# Patient Record
Sex: Female | Born: 1955
Health system: Southern US, Community
[De-identification: ages and names within clinical notes are randomized; demographics above are authoritative.]

## PROBLEM LIST (undated history)

## (undated) DIAGNOSIS — T7840XA Allergy, unspecified, initial encounter: Secondary | ICD-10-CM

## (undated) DIAGNOSIS — R3915 Urgency of urination: Secondary | ICD-10-CM

## (undated) DIAGNOSIS — N289 Disorder of kidney and ureter, unspecified: Secondary | ICD-10-CM

## (undated) DIAGNOSIS — E785 Hyperlipidemia, unspecified: Secondary | ICD-10-CM

## (undated) DIAGNOSIS — R350 Frequency of micturition: Secondary | ICD-10-CM

## (undated) DIAGNOSIS — R001 Bradycardia, unspecified: Secondary | ICD-10-CM

## (undated) DIAGNOSIS — A4902 Methicillin resistant Staphylococcus aureus infection, unspecified site: Secondary | ICD-10-CM

## (undated) DIAGNOSIS — I10 Essential (primary) hypertension: Secondary | ICD-10-CM

## (undated) DIAGNOSIS — H269 Unspecified cataract: Secondary | ICD-10-CM

## (undated) DIAGNOSIS — M1990 Unspecified osteoarthritis, unspecified site: Secondary | ICD-10-CM

## (undated) DIAGNOSIS — K219 Gastro-esophageal reflux disease without esophagitis: Secondary | ICD-10-CM

## (undated) HISTORY — PX: COLONOSCOPY: SHX174

## (undated) HISTORY — DX: Unspecified cataract: H26.9

## (undated) HISTORY — DX: Hyperlipidemia, unspecified: E78.5

## (undated) HISTORY — DX: Urgency of urination: R39.15

## (undated) HISTORY — DX: Methicillin resistant Staphylococcus aureus infection, unspecified site: A49.02

## (undated) HISTORY — PX: LITHOTRIPSY: SUR834

## (undated) HISTORY — DX: Allergy, unspecified, initial encounter: T78.40XA

## (undated) HISTORY — DX: Unspecified osteoarthritis, unspecified site: M19.90

---

## 1979-12-15 HISTORY — PX: ABDOMINAL HYSTERECTOMY: SHX81

## 1997-12-14 HISTORY — PX: OTHER SURGICAL HISTORY: SHX169

## 1999-02-03 ENCOUNTER — Inpatient Hospital Stay (HOSPITAL_COMMUNITY): Admission: EM | Admit: 1999-02-03 | Discharge: 1999-02-05 | Payer: Self-pay | Admitting: Cardiology

## 1999-02-03 ENCOUNTER — Emergency Department (HOSPITAL_COMMUNITY): Admission: EM | Admit: 1999-02-03 | Discharge: 1999-02-03 | Payer: Self-pay

## 1999-02-05 ENCOUNTER — Encounter: Payer: Self-pay | Admitting: Cardiology

## 1999-03-16 ENCOUNTER — Encounter: Payer: Self-pay | Admitting: *Deleted

## 1999-03-16 ENCOUNTER — Emergency Department (HOSPITAL_COMMUNITY): Admission: EM | Admit: 1999-03-16 | Discharge: 1999-03-16 | Payer: Self-pay | Admitting: *Deleted

## 1999-03-20 ENCOUNTER — Emergency Department (HOSPITAL_COMMUNITY): Admission: EM | Admit: 1999-03-20 | Discharge: 1999-03-20 | Payer: Self-pay | Admitting: Emergency Medicine

## 1999-04-29 ENCOUNTER — Ambulatory Visit (HOSPITAL_COMMUNITY): Admission: RE | Admit: 1999-04-29 | Discharge: 1999-04-29 | Payer: Self-pay | Admitting: *Deleted

## 1999-04-29 ENCOUNTER — Encounter: Payer: Self-pay | Admitting: *Deleted

## 1999-05-06 ENCOUNTER — Encounter: Payer: Self-pay | Admitting: Emergency Medicine

## 1999-05-06 ENCOUNTER — Emergency Department (HOSPITAL_COMMUNITY): Admission: EM | Admit: 1999-05-06 | Discharge: 1999-05-06 | Payer: Self-pay | Admitting: Emergency Medicine

## 1999-06-02 ENCOUNTER — Emergency Department (HOSPITAL_COMMUNITY): Admission: EM | Admit: 1999-06-02 | Discharge: 1999-06-02 | Payer: Self-pay | Admitting: Emergency Medicine

## 1999-11-08 ENCOUNTER — Emergency Department (HOSPITAL_COMMUNITY): Admission: EM | Admit: 1999-11-08 | Discharge: 1999-11-08 | Payer: Self-pay | Admitting: Emergency Medicine

## 1999-11-08 ENCOUNTER — Encounter: Payer: Self-pay | Admitting: Emergency Medicine

## 2000-01-03 ENCOUNTER — Encounter: Payer: Self-pay | Admitting: Emergency Medicine

## 2000-01-03 ENCOUNTER — Inpatient Hospital Stay (HOSPITAL_COMMUNITY): Admission: EM | Admit: 2000-01-03 | Discharge: 2000-01-08 | Payer: Self-pay | Admitting: Emergency Medicine

## 2000-01-04 ENCOUNTER — Encounter: Payer: Self-pay | Admitting: Internal Medicine

## 2000-01-05 ENCOUNTER — Encounter: Payer: Self-pay | Admitting: Internal Medicine

## 2000-02-18 ENCOUNTER — Emergency Department (HOSPITAL_COMMUNITY): Admission: EM | Admit: 2000-02-18 | Discharge: 2000-02-18 | Payer: Self-pay | Admitting: *Deleted

## 2000-02-18 ENCOUNTER — Encounter: Payer: Self-pay | Admitting: Emergency Medicine

## 2000-05-04 ENCOUNTER — Emergency Department (HOSPITAL_COMMUNITY): Admission: EM | Admit: 2000-05-04 | Discharge: 2000-05-05 | Payer: Self-pay | Admitting: *Deleted

## 2000-05-19 ENCOUNTER — Inpatient Hospital Stay (HOSPITAL_COMMUNITY): Admission: EM | Admit: 2000-05-19 | Discharge: 2000-05-21 | Payer: Self-pay | Admitting: Emergency Medicine

## 2000-05-19 ENCOUNTER — Encounter: Payer: Self-pay | Admitting: Emergency Medicine

## 2000-05-26 ENCOUNTER — Ambulatory Visit: Admission: RE | Admit: 2000-05-26 | Discharge: 2000-05-26 | Payer: Self-pay | Admitting: *Deleted

## 2000-06-08 ENCOUNTER — Encounter (INDEPENDENT_AMBULATORY_CARE_PROVIDER_SITE_OTHER): Payer: Self-pay | Admitting: Specialist

## 2000-06-08 ENCOUNTER — Ambulatory Visit (HOSPITAL_COMMUNITY): Admission: RE | Admit: 2000-06-08 | Discharge: 2000-06-08 | Payer: Self-pay | Admitting: Critical Care Medicine

## 2000-06-08 ENCOUNTER — Encounter: Payer: Self-pay | Admitting: Critical Care Medicine

## 2000-07-04 ENCOUNTER — Emergency Department (HOSPITAL_COMMUNITY): Admission: EM | Admit: 2000-07-04 | Discharge: 2000-07-04 | Payer: Self-pay | Admitting: Emergency Medicine

## 2000-07-04 ENCOUNTER — Encounter: Payer: Self-pay | Admitting: Emergency Medicine

## 2000-08-03 ENCOUNTER — Other Ambulatory Visit: Admission: RE | Admit: 2000-08-03 | Discharge: 2000-08-03 | Payer: Self-pay | Admitting: Family Medicine

## 2000-08-18 ENCOUNTER — Encounter: Payer: Self-pay | Admitting: Family Medicine

## 2000-08-18 ENCOUNTER — Ambulatory Visit (HOSPITAL_COMMUNITY): Admission: RE | Admit: 2000-08-18 | Discharge: 2000-08-18 | Payer: Self-pay | Admitting: Family Medicine

## 2000-08-18 ENCOUNTER — Ambulatory Visit: Admission: RE | Admit: 2000-08-18 | Discharge: 2000-08-18 | Payer: Self-pay | Admitting: Family Medicine

## 2000-09-11 ENCOUNTER — Encounter: Payer: Self-pay | Admitting: Emergency Medicine

## 2000-09-11 ENCOUNTER — Emergency Department (HOSPITAL_COMMUNITY): Admission: EM | Admit: 2000-09-11 | Discharge: 2000-09-11 | Payer: Self-pay

## 2001-07-02 ENCOUNTER — Emergency Department (HOSPITAL_COMMUNITY): Admission: EM | Admit: 2001-07-02 | Discharge: 2001-07-03 | Payer: Self-pay | Admitting: Emergency Medicine

## 2001-07-03 ENCOUNTER — Encounter: Payer: Self-pay | Admitting: Emergency Medicine

## 2002-02-01 ENCOUNTER — Encounter: Payer: Self-pay | Admitting: Cardiology

## 2002-02-01 ENCOUNTER — Inpatient Hospital Stay (HOSPITAL_COMMUNITY): Admission: EM | Admit: 2002-02-01 | Discharge: 2002-02-02 | Payer: Self-pay | Admitting: *Deleted

## 2002-02-01 ENCOUNTER — Encounter: Payer: Self-pay | Admitting: *Deleted

## 2002-02-02 ENCOUNTER — Encounter: Payer: Self-pay | Admitting: Cardiology

## 2002-07-06 ENCOUNTER — Emergency Department (HOSPITAL_COMMUNITY): Admission: EM | Admit: 2002-07-06 | Discharge: 2002-07-06 | Payer: Self-pay | Admitting: Emergency Medicine

## 2003-11-30 ENCOUNTER — Emergency Department (HOSPITAL_COMMUNITY): Admission: EM | Admit: 2003-11-30 | Discharge: 2003-12-01 | Payer: Self-pay | Admitting: Emergency Medicine

## 2004-07-01 ENCOUNTER — Encounter: Admission: RE | Admit: 2004-07-01 | Discharge: 2004-07-01 | Payer: Self-pay | Admitting: Internal Medicine

## 2005-05-18 ENCOUNTER — Emergency Department (HOSPITAL_COMMUNITY): Admission: EM | Admit: 2005-05-18 | Discharge: 2005-05-18 | Payer: Self-pay | Admitting: Emergency Medicine

## 2006-06-25 ENCOUNTER — Emergency Department (HOSPITAL_COMMUNITY): Admission: EM | Admit: 2006-06-25 | Discharge: 2006-06-25 | Payer: Self-pay | Admitting: Emergency Medicine

## 2006-06-28 ENCOUNTER — Ambulatory Visit (HOSPITAL_COMMUNITY): Admission: RE | Admit: 2006-06-28 | Discharge: 2006-06-28 | Payer: Self-pay | Admitting: Urology

## 2009-01-28 ENCOUNTER — Emergency Department (HOSPITAL_COMMUNITY): Admission: EM | Admit: 2009-01-28 | Discharge: 2009-01-28 | Payer: Self-pay | Admitting: Emergency Medicine

## 2011-02-05 ENCOUNTER — Other Ambulatory Visit: Payer: Self-pay | Admitting: Family Medicine

## 2011-02-05 DIAGNOSIS — R609 Edema, unspecified: Secondary | ICD-10-CM

## 2011-02-05 DIAGNOSIS — R52 Pain, unspecified: Secondary | ICD-10-CM

## 2011-02-10 ENCOUNTER — Other Ambulatory Visit: Payer: Self-pay

## 2011-02-12 ENCOUNTER — Other Ambulatory Visit: Payer: Self-pay

## 2011-02-12 ENCOUNTER — Ambulatory Visit
Admission: RE | Admit: 2011-02-12 | Discharge: 2011-02-12 | Disposition: A | Discharge status: Home / Self Care | Payer: BC Managed Care – PPO | Source: Ambulatory Visit | Attending: Family Medicine | Admitting: Family Medicine

## 2011-02-12 DIAGNOSIS — R52 Pain, unspecified: Secondary | ICD-10-CM

## 2011-02-12 DIAGNOSIS — R609 Edema, unspecified: Secondary | ICD-10-CM

## 2011-02-20 ENCOUNTER — Emergency Department (HOSPITAL_COMMUNITY)
Admission: EM | Admit: 2011-02-20 | Discharge: 2011-02-20 | Disposition: A | Discharge status: Home / Self Care | Payer: BC Managed Care – PPO | Attending: Emergency Medicine | Admitting: Emergency Medicine

## 2011-02-20 DIAGNOSIS — Z86718 Personal history of other venous thrombosis and embolism: Secondary | ICD-10-CM | POA: Insufficient documentation

## 2011-02-20 DIAGNOSIS — I10 Essential (primary) hypertension: Secondary | ICD-10-CM | POA: Insufficient documentation

## 2011-02-20 DIAGNOSIS — Z87442 Personal history of urinary calculi: Secondary | ICD-10-CM | POA: Insufficient documentation

## 2011-02-20 DIAGNOSIS — M25569 Pain in unspecified knee: Secondary | ICD-10-CM | POA: Insufficient documentation

## 2011-02-20 DIAGNOSIS — M7120 Synovial cyst of popliteal space [Baker], unspecified knee: Secondary | ICD-10-CM | POA: Insufficient documentation

## 2011-02-20 DIAGNOSIS — M545 Low back pain, unspecified: Secondary | ICD-10-CM | POA: Insufficient documentation

## 2011-02-20 LAB — URINALYSIS, ROUTINE W REFLEX MICROSCOPIC
Bilirubin Urine: NEGATIVE
Glucose, UA: NEGATIVE mg/dL
Hgb urine dipstick: NEGATIVE
Ketones, ur: NEGATIVE mg/dL
Nitrite: NEGATIVE
Protein, ur: NEGATIVE mg/dL
Specific Gravity, Urine: 1.021 (ref 1.005–1.030)
Urobilinogen, UA: 1 mg/dL (ref 0.0–1.0)
pH: 6 (ref 5.0–8.0)

## 2011-02-20 LAB — POCT I-STAT, CHEM 8
BUN: 27 mg/dL — ABNORMAL HIGH (ref 6–23)
Calcium, Ion: 1.24 mmol/L (ref 1.12–1.32)
Chloride: 106 mEq/L (ref 96–112)
Creatinine, Ser: 1.2 mg/dL (ref 0.4–1.2)
Glucose, Bld: 91 mg/dL (ref 70–99)
HCT: 40 % (ref 36.0–46.0)
Hemoglobin: 13.6 g/dL (ref 12.0–15.0)
Potassium: 3.6 mEq/L (ref 3.5–5.1)
Sodium: 144 mEq/L (ref 135–145)
TCO2: 27 mmol/L (ref 0–100)

## 2011-03-31 LAB — DIFFERENTIAL
Basophils Absolute: 0.1 10*3/uL (ref 0.0–0.1)
Basophils Relative: 1 % (ref 0–1)
Eosinophils Absolute: 0.2 10*3/uL (ref 0.0–0.7)
Eosinophils Relative: 2 % (ref 0–5)
Lymphocytes Relative: 38 % (ref 12–46)
Lymphs Abs: 3.6 10*3/uL (ref 0.7–4.0)
Monocytes Absolute: 0.6 10*3/uL (ref 0.1–1.0)
Monocytes Relative: 7 % (ref 3–12)
Neutro Abs: 5.1 10*3/uL (ref 1.7–7.7)
Neutrophils Relative %: 54 % (ref 43–77)

## 2011-03-31 LAB — CBC
HCT: 37.9 % (ref 36.0–46.0)
Hemoglobin: 12.9 g/dL (ref 12.0–15.0)
MCHC: 33.9 g/dL (ref 30.0–36.0)
MCV: 87.6 fL (ref 78.0–100.0)
Platelets: 351 10*3/uL (ref 150–400)
RBC: 4.32 MIL/uL (ref 3.87–5.11)
RDW: 14.4 % (ref 11.5–15.5)
WBC: 9.5 10*3/uL (ref 4.0–10.5)

## 2011-03-31 LAB — POCT CARDIAC MARKERS
CKMB, poc: <1 ng/mL — ABNORMAL LOW (ref 1.0–8.0)
Myoglobin, poc: 44.4 ng/mL (ref 12–200)
Troponin i, poc: <0.05 ng/mL (ref 0.00–0.09)

## 2011-03-31 LAB — POCT I-STAT, CHEM 8
BUN: 30 mg/dL — ABNORMAL HIGH (ref 6–23)
Calcium, Ion: 1.23 mmol/L (ref 1.12–1.32)
Chloride: 111 mEq/L (ref 96–112)
Creatinine, Ser: 1.4 mg/dL — ABNORMAL HIGH (ref 0.4–1.2)
Glucose, Bld: 88 mg/dL (ref 70–99)
HCT: 41 % (ref 36.0–46.0)
Hemoglobin: 13.9 g/dL (ref 12.0–15.0)
Potassium: 4 mEq/L (ref 3.5–5.1)
Sodium: 142 mEq/L (ref 135–145)
TCO2: 24 mmol/L (ref 0–100)

## 2011-05-01 NOTE — Procedures (Signed)
[place]. [facility]  Patient:    Amber Meyer, Amber Meyer                   MRN: [id number] Proc. Date: 06/08/00 Adm. Date:  [id number] Disc. Date: [id number] Attending:  Anastasio Auerbach CC:         Crista Luria, M.D.                           Procedure Report  PROCEDURE PERFORMED:  Bronchoscopy.  ENDOSCOPIST:  Charlcie Cradle. Delford Field, M.D. [facility]  INDICATIONS FOR PROCEDURE:  Bilateral pulmonary infiltrates, evaluate for sarcoidosis.  ANESTHESIA:  1% Xylocaine local.  PREOPERATIVE MEDICATIONS:  Demerol 50 mg IV push.  Versed 5 mg IV push.  DESCRIPTION OF PROCEDURE:  The Olympus video bronchoscope was introduced to the right naris.  The upper airways were visualized and were unremarkable. The entire tracheobronchial tree was visualized, revealed cobblestone appearance to the airway but no other specific endobronchial lesions were seen.  Transbronchial biopsies x 6 were obtained from the lateral segment, right lower lobe.  Bronchial washings were obtained as well from the right lower lobe.  COMPLICATIONS:  None.  IMPRESSION:  Interstitial granulomatous changes in the lower lobes evaluate for granulomatous disease.  RECOMMENDATIONS:  Follow up pathology. DD:  06/08/00 TD:  06/09/00 Job: [id number]

## 2011-05-01 NOTE — Discharge Summary (Signed)
[place]. [facility]  Patient:    Amber Meyer, Amber Meyer                   MRN: [id number] Adm. Date:  [id number] Disc. Date: [id number] Attending:  Anastasio Auerbach CC:         Crista Luria, M.D.             Charlcie Cradle Delford Field, M.D. [facility]             Mohan N. Sharyn Lull, M.D.             Lindaann Slough, M.D.                           Discharge Summary  DATE OF BIRTH:  12-26-1955  DISCHARGE DIAGNOSES:  1. Chest pain/shortness of breath.     a. No evidence of pulmonary embolus by CT scan.     b. No evidence of deep venous thrombosis by Dopplers.     c. No evidence of myocardial ischemia.     d. Probable gastroesophageal reflux disease.  2. Benign positional vertigo.  MRI, MRA of brain normal.  3. Chronic heartburn.  4. Uncontrolled hypertension, improved.  5. Mediastinal adenopathy.     a. CT scan, question sarcoidosis.     b. ACE level 40, January, 2001.     c. Follow up with pulmonologist.  6. Hypokalemia.     a. Secondary to vomiting prior to admission.     b. Resolved.  7. Vomiting secondary to vertigo, resolved.  Guaiac-positive emesis,     hemoglobin stable.  8. History of bilateral pulmonary emboli, January, 2001.  Spiral CT (January,     2001):  Segmental defect in the left lower lobe and questionable right     lower lobe.  9. Kidney stones.     a. History of surgery and lithotripsy.     b. CT scan (March, 2001):  Nonobstructing left renal stone. 10. Vaginal hysterectomy, 1983.     a. For menorrhagia.     b. Questionable right ovarian cyst by CT scan (March, 2001).     c. Soft tissue prominence along the medial aspect of both ovaries        consistent with possible post-surgical changes - March 1 CT (no change        from November, 2000 CT). 11. Back lipoma, removed 1989. 12. Multiple tiny (1-2 mm) intrahepatic cysts by CT scan (March, 2001). 13. Questionable history of low sugars as an outpatient.  Fasting CBGs in     hospital 71 to  92.  DISCHARGE MEDICATIONS:  1. Protonix 40 mg p.o. b.i.d. x 2 weeks, then daily for stomach irritation     (new medication).  2. Lopressor 50 mg one p.o. q.12h. (new medication, replacing Toprol).  3. Meclizine 25 mg q.6h. x 4 days then p.r.n. vertigo.  4. Maxzide 25/37.5 daily.  5. Enteric-coated aspirin 325 mg q.d. (new, cardioprotection).  DO NOT TAKE TOPROL, PEPCID, OR COUMADIN.  CONDITION UPON DISCHARGE:  Stable.  Mrs. Ector still has some anterior chest discomfort but an absolutely normal EKG.  No shortness of breath.  Her blood pressure upon discharge is 120/60, heart rate 70, respiratory rate 20, oxygen saturation 95% on room air.  DISPOSITION:  The patient will be discharged home.  RECOMMENDED ACTIVITY:  No driving until vertigo resolves.  I have excused the patient from work through June 14 due to upcoming  physician appointments.  RECOMMENDED DIET:  Low salt, low fat, frequent meals.  SPECIAL INSTRUCTIONS:  1. Avoid Motrin-like medications which can irritate the stomach.  2. Use Tylenol as needed for pain.  3. Contact Dr. Crista Luria should problems arise before follow-up     appointment.  FOLLOW-UP:  1. Dr. Crista Luria at [facility] on Tuesday, June 12, at 8:30 a.m.  At     this visit, she will need her blood pressure rechecked on the new     medications as well as a basic metabolic panel checked.  She had a low     potassium on admission.  2. Pulmonary function tests at [facility] on Wednesday, June 13, at     10 oclock.  The patient was instructed to come 15 minutes early and check     in at the outpatient registration.  3. Dr. Shan Levans [facility]mon[facility]) on Thursday, June 14, at 11:30.     The patient is to come 15 minutes early and was instructed to bring her     most recent chest x-ray and CT scan from [facility]  LABORATORIES PENDING UPON DISCHARGE:  Factor V Leiden and prothrombin gene mutation.  CONSULTANTS:   None.  PROCEDURES:  1. Electrocardiogram:  Normal sinus rhythm.  Mild left axis deviation on     initial electrocardiogram.  2. Head CT without contrast (June 6) negative.  3. Chest x-ray (May 19, 2000):  Improved aeration from January, 1997.  Heart     size and mediastinal contours are within normal limits.  Lungs clear.  4. Chest CT with contrast (June 6):  Changes of subcarinal and mediastinal     lymphadenopathy.  Question of sarcoidosis versus hyperplastic lymph nodes.     Mild ground glass lung density also suggested with a few areas of     subpleural blood formation.  No evidence for pulmonary embolus.  The     adenopathy was commented as stable.  5. CT of the lower extremities (June 6):  No evidence for deep venous     thrombosis.  6. MRI/MRA of the brain:  Normal.  7. Lower extremity venous Dopplers (June 7):  Right normal.  Left shows no     evidence of acute thrombosis.  She does have a history of deep venous     thrombosis from the superficial femoral vein to the distal iliac.  The     vessel walls appeared thickened indicating this history of     thrombophlebitis.  HOSPITAL COURSE: #1 - CHEST PAIN/SHORTNESS OF BREATH:  Mrs. Etheridge is a 55 year old African- American female with a history of PE January, 2001 and a recent low INR who awakened this morning with new onset of sharp, pleuritic substernal chest pain associated with shortness of breath.  She subsequently developed vertigo with nausea and vomiting.  Prior to this, she denied any cough or a history of dyspnea on exertion.  With her previous pulmonary embolus in January, she simply presented with dyspnea on exertion.  There was no chest pain.  She did have a DVT at that time but no leg swelling.  ABG on room air demonstrated a pH of 7.40 with a PCO2 of 42 and a PO2 of 81.  Her AA gradient was 10. Subsequent spiral chest CT to rule out pulmonary embolus was negative as well  as CT through the upper legs was  negative for DVT.  Lower extremity venous Dopplers were also negative.  Her initial EKG demonstrated normal sinus rhythm with left axis deviation.  Serial cardiac enzymes were negative.  Follow-up EKG was completely normal.  Of note, she did have a negative stress Cardiolite done by Dr. Sharyn Lull in February, 2000.  She suffers from chronic daily indigestion and this could have likely represented some reflex with esophageal spasm.  I placed her on a proton pump inhibitor.  She will follow up with Dr. Marny Lowenstein to reassess symptoms.  Referral to Dr. Sharyn Lull is at Dr. Lona Millard discretion.  #2 - VERTIGO:  Mrs. Kreiger was seen three weeks ago in the outpatient clinic with new onset vertigo.  She was diagnosed as having an inner ear problem and given meclizine.  She denies any other cranial nerve symptoms with the vertigo.  Given the persistent nature of her symptoms and the presentation with chest pain, I did opt to do an MRI/MRA which was completely normal. Indeed, I think this is benign positional vertigo and will continue her on meclizine for the next four days and then as needed.  #3 - UNCONTROLLED HYPERTENSION:  Mrs. Cillo had had clinic blood pressures in the 160 to 170 range on Toprol and a diuretic.  I did opt to switch her to Lopressor twice a day.  On 100 mg b.i.d., her morning pressure was 110.  I opted to cut her back to 50 mg b.i.d. and she will follow up with Dr. Maple Hudson next week for repeat blood pressure.  #4 - HYPOKALEMIA:  On presentation, Mrs. Pistilli had a potassium level of 3.5. She is on Maxzide and had been vomiting.  I suspect this to be the reason. She did receive oral potassium and her discharge level was 4.2.  Recheck in follow-up.  I doubt that she will need maintenance potassium.  #5 - HISTORY OF BILATERAL PULMONARY EMBOLI:  She was diagnosed with these in January, 2001.  She has been on Coumadin now for nearly six months.  She then had a subtherapeutic INR on  presentation and, apparently, a week earlier, it was also low at 1.8.  Come to find out, Mrs. Criss has been eating large amounts of strawberries daily which I suspect has inhibited Coumadin based on vitamin K load.  Given the fact that there was no predisposing factor and no clear reason to indicate she would be hypercoagulable, I think it is reasonable to stop the Coumadin at this time.  I did opt to draw a factor V Leiden gene mutation as well as a prothrombin gene mutation.  She did have an ANA done in January at the time of diagnoses which was negative.  We will watch closely for any signs of clotting.  I discussed this with Mrs. Dudzinski and she understands.  Should anything else arise suggesting she was hypercoagulable, would defer resumption of Coumadin to Drs. Menzer and [employer].  #6 - MEDIASTINAL ADENOPATHY:  In going back through Mrs. Bergquist records, she had evidence of mediastinal adenopathy as well as ground glass appearance on her January, 2001 CT scan.  An ACE level was drawn at that time which was normal at 40.  Repeat CT scan done this admission, looking for pulmonary embolus, did again demonstrate the adenopathy and ground glass appearance. The adenopathy appeared stable based on scan.  Mrs. Dinning denies significant dyspnea on exertion or chronic exertion and has no chronic cough.  She does not seem to demonstrate any other signs of sarcoidosis.  Certainly within the differential also remains a lymphoma.  Mrs. Musgrave  quit smoking 12 years ago, so small cell lung cancer is less likely.  However, I think it is important for [place] to figure out exactly what this is.  She will have pulmonary function tests done on June 13 with results to go to Dr. Delford Field.  She will see Dr. Delford Field on May 14 for her first visit with him.  She will be bringing her recent chest x-ray and CAT scan for review and I will forward him a copy of this discharge summary.  Of note, she has not been on  any seizure medications.  #7 - ABNORMALITIES ON ABDOMINOPELVIC CT (February 18, 2000):  Mrs. Ortlieb appeared to have a right ovarian cyst.  There was also some soft tissue prominence along the medial aspect of both ovaries which was felt to be consistent with post surgical changes.  They commented that it looked the same as on the CT scan done November 08, 1999.  I discussed this with Dr. Marny Lowenstein. If there is any suggestion of abnormal pelvic examination or problems, would consider ultrasound.  Again, this CT scan was done not this admission but in March, 2001.  DISCHARGE LABORATORIES:  Sodium 137, potassium 4.2, chloride 105, bicarbonate 29, BUN 11, creatinine 0.9, glucose 12.7, WBC 7200, platelet count 251.  INR 1.1. DD:  05/21/00 TD:  05/25/00 Job: [id number]

## 2011-05-01 NOTE — Discharge Summary (Signed)
[place]. [facility]  Patient:    Amber Meyer                    MRN: [id number] Adm. Date:  [id number] Disc. Date: [id number] Attending:  Rinaldo Cloud N                           Discharge Summary  ADMITTING DIAGNOSES: 1. Bilateral pulmonary embolism. 2. Uncontrolled hypertension. 3. Tobacco abuse.  DISCHARGE DIAGNOSES: 1. Bilateral pulmonary embolism. 2. Hypertension. 3. Tobacco abuse.  DISCHARGE MEDICATIONS: 1. Coumadin 4 mg 1 tablet q.d. 2. Baby aspirin 81 mg 1 tablet q.d. 3. Toprol XL 50 mg q.d. 4. Prilosec 20 mg 1 capsule q.d. at night.  ACTIVITY: As tolerated.  DIET: Low salt, low cholesterol.  The patient has been advised that if she notices any bleeding or feeling weak or tired, dizzy or has black tarry stools, she should call immediately.  The patient has been advised to have a CBC and pro time in one week.  She will follow up with me in one week.  CONDITION ON DISCHARGE: Stable.  HISTORY OF PRESENT ILLNESS: [name] is a 55 year old black female with a past medical history significant for hypertension and tobacco abuse.  She was admitted by Dr. August Saucer on January 20 because of the sudden onset of sharp chest pain associated with shortness of breath.  There was no substernal chest pain or diaphoresis.  The patient attempted to rest during the night but noted increasing shortness of breath which was worse when she reclined.  She also had transient pleuritic chest pain as well on the left side of the chest.  The patient denied any fever or chills.  Due to the persistence of the symptoms the patient decided to  come to the ER.  In the ER the patient subsequently had a CT of chest which showed bilateral pulmonary embolisms.  She was started on Lovenox and Coumadin.  PAST MEDICAL HISTORY:  History of recurrent chest pain.  She had Persantine and  Cardiolite in the past which was negative.  The other significant past  medical history is hypertension and GERD.  Home medications were Toprol 50 mg p.o. q.d. and Prilosec 20 mg p.o. h.s.  The patient denies any history of prolonged ______ , history of trauma.  She denies any leg cramps or history of oral contraceptives.  There was no significant change in weight.  SOCIAL HISTORY: She does not smoke or drink.  She works in [employer].  She is on her feet most of the day. The patient is single and has four children, alive and well.  ALLERGIES: No known drug allergies.  PAST MEDICAL HISTORY:  She had a history of a hysterectomy about 18 years ago. She had a tumor removed from her back 9 years ago.  There is a history of kidney stones in the past.  FAMILY HISTORY:  Family history is positive for coronary artery disease.  PHYSICAL EXAMINATION:  GENERAL: On examination she was alert, oriented x 3 and in no acute distress.  VITAL SIGNS: The blood pressure was 175/120.  Pulse was 122, respirations were 0. O2 saturations on room air were 98%.  EYES:  The conjunctiva was pink.  NECK: The neck was supple.  There was no JVD and no bruit.  LUNGS:  The lungs were clear to auscultation.  HEART:  There was no pericardial rub.  There was no  pleuritic rub.  S1 and S2 were normal.  There was no S3 gallop and no murmurs or rubs.  There was no ______ as to palpation.  ABDOMEN:  The abdomen was soft.  Bowel sounds were present.  It was nontender with no masses.  EXTREMITIES:  There was no clubbing, cyanosis or edema.  There was no evidence f deep venous thrombosis.  LABORATORY DATA:  CT of the chest showed segmental defects in the left lower lobe and question of the right lower lobe consistent with pulmonary embolism.  Hemoccult was negative.  Sodium was 144, potassium was 3.5, chloride 108, bicarb 26.  Glucose was 105, BUN 13, creatinine 0.9.  Her ANA was negative.  Cardiac enzymes were negative.  EKG showed sinus tachycardia.  Otherwise,  there was no evidence of acute right heart strain present.  The patient was admitted to the telemetry floor and was started on subcutaneous  Lovenox 1 mg/kg q. 12h. and was also started on Coumadin.  The patient gradually improved.  Her chest pain resolved.  Her prothrombin time today is 20.3 with INR of 2.4.  The patient has no evidence of bleeding.  The patients hemoglobin has been stable.  The patient is chest pain free.  There is no evidence of bleeding. The patient did not have any further episodes of chest pain during the hospital stay.  The patient will be discharged home on the above medications and will be followed up in my office in one week. DD:  01/08/00 TD:  01/08/00 Job: [id number]

## 2011-05-01 NOTE — H&P (Signed)
[place]. [facility]  Patient:    Amber Meyer                    MRN: [id number] Adm. Date:  [id number] Attending:  Gwenyth Bender                         History and Physical  PATIENT LOCATION:  ICU.  CHIEF COMPLAINT:  Chest pain with shortness of breath.  HISTORY OF PRESENT ILLNESS:  This is the second recent [facility] admission for this 55 year old black female who was doing well until yesterday afternoon at approximately 3 p.m.  While getting out of her car the patient developed a sudden sharp chest pain with shortness of breath.  There was no substernal pressure pain or diaphoresis.  The patient attempted to rest during he night, but noted increasing shortness of breath which was worse when she reclined. She did have transient pleuritic pain as well on the left chest region as well s the back.  During the night she experienced transient left arm pain.  With the symptoms persisting the patient subsequently came to the emergency room at approximately 4 p.m. for further evaluation.  The patient was found to have a pulmonary embolus by CT scan and is admitted at this time.  PAST MEDICAL HISTORY:  Remarkable for having had similar complaints of chest pain in March 2000.  Cardiac evaluation including Persantine Cardiolite stress test as negative.  She was noted to have hypertension with the possibility of gastrointestinal reflux.  The patient was treated and evaluated by Dr. _______ t that time and discharged to home on Toprol 50 mg and Prilosec.  The patient denies recent prolonged bed rest.  No history of trauma.  She denies any leg cramps.  No oral contraceptives.  No significant change in weight.  She has noted intermittent swelling in her ankles for the past two weeks.  SOCIAL HISTORY:  The patient does not smoke or drink.  She works in [employer], and she is on her feet most of the days.  The patient is single, has four  children alive and well.  REVIEW OF SYSTEMS:  Otherwise remarkable for intermittent pain in the right hand with a mild swelling noted four days ago.  No known trauma.  She has not noted ny other bruises.  CURRENT MEDICATIONS:  As noted above, Toprol XL 50 mg q.d.  ALLERGIES:  The patient has no known allergies.  PAST SURGICAL HISTORY:  She has had a hysterectomy approximately 18 years ago. She has had a tumor removed from her back nine years ago.  The patient reports having had two kidney stones removed on the right side over the past 10 years.  FAMILY HISTORY:  The patient has a strong family history of myocardial infarction occurring at an early age.  PHYSICAL EXAMINATION:  VITAL SIGNS:  Initial blood pressure 175/120, pulse 122, respirations 20, temperature 99.1.  O2 saturation was 98% on room air.  GENERAL APPEARANCE:  She is a well-developed, well-nourished black female in mild distress.  HEENT:  Head:  Normocephalic, atraumatic without bruits.  Extraocular muscles are intact.  Sclerae nonicteric.  She has bilateral maxillary sinus tenderness. Posterior pharynx is clear.  TMs are clear.  NECK:  Supple without enlarged thyroid.  No posterior cervical nodes.  CHEST:  Lungs notably are clear without wheezes or rales.  No rhonchi or rub. Patchy E to A change heard  at the left base.  CARDIOVASCULAR:  Normal S1 and S2.  No S3, S4, murmurs, or rubs.  No chest wall  tenderness to palpation.  ABDOMEN:  Bowel sounds are present.  No enlarged liver or spleen, masses, or tenderness.  MUSCULOSKELETAL:  Right lower extremity is benign.  Left lower leg notable for tenderness in the superficial vein medially.  She has a positive Homans sign in he left leg as well.  No edema appreciated.  Pulses are intact.  Notably on the right hand she has an area between her thumb and index finger where there is a raised  area of induration which is tender to palpation without increased  warmth.  This is not definitely in a vein.  No other unusual lesions noted.  LABORATORY AND X-RAY DATA:  Preliminary labs:  PT of 13.7, INR of 1.1, PTT of 33. CK 131, CK-MB 0.5, troponin 0.03.  CT scan shows segmental defects in the left lower lung and questionable right lower lung consistent with a pulmonary embolus.  IMPRESSION: 1. Acute pulmonary embolus, symptomatic. 2. Hypertension, uncontrolled. 3. Chest pain secondary to the above. 4. Rule out deep venous thrombosis of the left lower extremity. 5. Rule out underlying vasculitis.  PLAN:  The patient is to be admitted to the ICU for 24 hours and then to telemetry if stable.  Will start Lovenox followed by Coumadin.  Will obtain venous Dopplers of the lower extremities.  Will check an ANA as well.  Further control of blood  pressure as necessary.  Continue O2 support for now. DD:  01/03/00 TD:  01/04/00 Job: [id number]

## 2011-05-01 NOTE — Discharge Summary (Signed)
[place]  Patient:    Amber Meyer, Amber Meyer Visit Number: [id number] MRN: [id number]          Service Type: MED Location: [phone] Attending Physician:  Robynn Pane Admit Date:  02/01/2002 Discharge Date: 02/02/2002                             Discharge Summary  ADMITTING DIAGNOSES: 1. Chest pain, rule out myocardial infarction, rule out pulmonary embolism. 2. Hypertension. 3. History of tobacco abuse. 4. Positive family history of coronary artery disease. 5. Surgical menopause. 6. 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 7. History of kidney stones. 8. Questionable history of sarcoidosis.  FINAL DIAGNOSES: 1. Chest pain, rule out gastroesophageal reflux disease, rule out cervical    radiculopathy. 2. Hypertension. 3. History of tobacco abuse. 4. Positive family history of coronary artery disease. 5. Surgical menopause. 6. 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 7. History of kidney stones. 8. Questionable history of sarcoidosis.  DISCHARGE MEDICATIONS: 1. Toprol XL 200 mg 1 tablet daily. 2. Altace 2.5 mg 1 capsule daily. 3. Enteric coated aspirin 325 mg 1 tablet daily. 4. Protonix 40 mg 1 tablet daily a half hour before breakfast.  ACTIVITY:  As tolerated.  DIET:  Low salt, low cholesterol.  FOLLOWUP:  Follow up with myself in one week and primary medical doctor as scheduled.  CONDITION ON DISCHARGE:  Stable.  BRIEF HISTORY/HOSPITAL COURSE:  Amber Meyer is a 55 year old black female with past medical history significant for hypertension, history of bilateral PE and DVT approximately two years ago, was treated with Coumadin for approximately six months, questionable history of sarcoidosis, positive family history of coronary artery disease.  She came to the ER complaining of retrosternal  chest pressure described as if someone was sitting on the chest, grade 8 over 10, associated with numbness in both arms, nausea.  She denies any shortness of breath, diaphoresis, palpitations, light-headedness, or syncope.  She denies relation of chest pain to breathing, food, or movement. She denies PND, orthopnea, leg swelling.  She denies cough, fever, chills, no history of exertional chest pain, but complains of exertional dyspnea with minimal exertion for approximately the last three months.  She states she took Coumadin for six months after PE as above and had stopped for more than one year.  PAST MEDICAL HISTORY:  As above.  PAST SURGICAL HISTORY:  She had a vaginal hysterectomy in 1983.  She had lithotripsy in 1994 and 1995 for kidney stones.  She has back surgery for lipoma in 1989.  ALLERGIES:  No known drug allergies.  MEDICATIONS:  She takes Toprol XL 200 mg p.o. q.d. at home.  SOCIAL HISTORY:  She is single, has four children.  She smoked a half a pack per day for 8-10 years.  She quit seven years ago.  No history of alcohol abuse.  She was born in [place].  She lives in [place].  She worked as a Electrical engineer.  FAMILY HISTORY:  Father is alive and is 42 years of age.  He has Alzheimers disease.  Mother died of ruptured aneurysm of the brain at the age of 90.  One brother died of MI at the age of 37.  One sister had an MI at the age of 41.  PHYSICAL EXAMINATION:  GENERAL:  She is alert and oriented x3, in no acute distress.  VITAL SIGNS:  Blood pressure is 147/86, pulse is 102, sinus tachycardia on monitor.  HEENT:  Conjunctivae pink.  NECK:  Supple, no JVD, no bruit.  LUNGS:  She had decreased breath sounds at the left base.  Otherwise, good air entry.  There was no wheezing.  CARDIOVASCULAR:  S1, S2 was normal.  There was a soft, systolic murmur at the apex.  There was no rub.  ABDOMEN:  Soft, bowel sounds are present, nontender.  There was  no mass.  EXTREMITIES:  There was no clubbing, cyanosis, or edema.  EKG showed normal sinus rhythm, no acute ischemic changes were noted.  LABORATORIES:  Hemoglobin was 14.1, hematocrit 42.2, white count 7.3, two sets of troponin I and CPK MBs are negative.  BUN was 7, creatinine was 1.1, glucose 114.  CT of the chest, no evidence of pulmonary embolism or other acute disease, scattered areas of subsegmental atelectasis on both lower lobes, no evidence of thromboembolic disease in the lower extremities. Persantine Cardiolite showed normal myocardial perfusion, EF of 48%.  HOSPITAL COURSE:  Briefly, the patient was admitted to telemetry unit. Myocardial infarction was ruled out by serial enzymes and EKG.  The patient was started on IV heparin and nitrates.  CT of the chest and the lower extremities were obtained in the ER which showed no evidence of PE or DVT. The patient did not have an episodes of chest pain during the hospital stay. Her cardiac enzymes were negative.  The patient underwent Persantine Cardiolite on today which showed no evidence of ischemia with EF of 48%.  The patient will be discharged home on the above medications and will be followed up in my office in one week.  If she continues to have numbness in the arms, we will get an MRI of the cervical spine and refer her to neurosurgery as appropriate.  The patient presently denies any numbness or neck pain.  The patient will be discharged home on the above medications and will be followed in my office in one week with her primary doctor as scheduled.Attending Physician:  Robynn Pane DD:  02/02/02 TD:  02/03/02 Job: [id number]

## 2011-12-15 HISTORY — PX: KNEE SURGERY: SHX244

## 2013-05-28 ENCOUNTER — Emergency Department (HOSPITAL_COMMUNITY)
Admission: EM | Admit: 2013-05-28 | Discharge: 2013-05-28 | Disposition: A | Discharge status: Home / Self Care | Payer: Worker's Compensation | Attending: Emergency Medicine | Admitting: Emergency Medicine

## 2013-05-28 ENCOUNTER — Encounter (HOSPITAL_COMMUNITY): Payer: Self-pay

## 2013-05-28 DIAGNOSIS — I10 Essential (primary) hypertension: Secondary | ICD-10-CM | POA: Insufficient documentation

## 2013-05-28 DIAGNOSIS — J683 Other acute and subacute respiratory conditions due to chemicals, gases, fumes and vapors: Secondary | ICD-10-CM | POA: Insufficient documentation

## 2013-05-28 DIAGNOSIS — Y929 Unspecified place or not applicable: Secondary | ICD-10-CM | POA: Insufficient documentation

## 2013-05-28 DIAGNOSIS — Z87891 Personal history of nicotine dependence: Secondary | ICD-10-CM | POA: Insufficient documentation

## 2013-05-28 DIAGNOSIS — R197 Diarrhea, unspecified: Secondary | ICD-10-CM | POA: Insufficient documentation

## 2013-05-28 DIAGNOSIS — R110 Nausea: Secondary | ICD-10-CM | POA: Insufficient documentation

## 2013-05-28 DIAGNOSIS — R0789 Other chest pain: Secondary | ICD-10-CM | POA: Insufficient documentation

## 2013-05-28 DIAGNOSIS — Y998 Other external cause status: Secondary | ICD-10-CM | POA: Insufficient documentation

## 2013-05-28 DIAGNOSIS — T5991XA Toxic effect of unspecified gases, fumes and vapors, accidental (unintentional), initial encounter: Secondary | ICD-10-CM | POA: Insufficient documentation

## 2013-05-28 DIAGNOSIS — J069 Acute upper respiratory infection, unspecified: Secondary | ICD-10-CM | POA: Insufficient documentation

## 2013-05-28 HISTORY — DX: Essential (primary) hypertension: I10

## 2013-05-28 MED ORDER — ALBUTEROL SULFATE (5 MG/ML) 0.5% IN NEBU
5.0000 mg | INHALATION_SOLUTION | Freq: Once | RESPIRATORY_TRACT | Status: AC
  Administered 2013-05-28: 5 mg via RESPIRATORY_TRACT
  Filled 2013-05-28: qty 1

## 2013-05-28 MED ORDER — IPRATROPIUM BROMIDE 0.02 % IN SOLN
0.5000 mg | Freq: Once | RESPIRATORY_TRACT | Status: AC
  Administered 2013-05-28: 0.5 mg via RESPIRATORY_TRACT
  Filled 2013-05-28: qty 2.5

## 2013-05-28 NOTE — ED Notes (Signed)
Patient exposed to bleach and rust cleaner fumes on 6/10. Patient seen by prime care and given a few meds (proventil. Hydrocodone), patient still reporting sore throat and lips tingling.  No respiratory distress, no other neuro symptoms noted.

## 2013-05-28 NOTE — ED Notes (Signed)
PT reports since the chemical exposure on 6-10 -14 she can not keep food or liquids. Pt reports diarrhea after every attempt to eat and drink. Pt has a cough non productive.

## 2013-05-28 NOTE — ED Provider Notes (Signed)
History    This chart was scribed for Doran Durand, non-physician practitioner working with Richardean Canal, MD by Leone Payor, ED Scribe. This patient was seen in room TR05C/TR05C and the patient's care was started at 1400.   CSN: [id number]  Arrival date & time 05/28/13  1400   First MD Initiated Contact with Patient 05/28/13 1518      Chief Complaint  Patient presents with  . Sore Throat  . Chemical Exposure     The history is provided by the patient. No language interpreter was used.    HPI Comments: Amber Meyer is a 57 y.o. female who presents to the Emergency Department complaining of ongoing, constant, unchanged, unproductive cough that started 5 days ago after chemical exposure. States he was exposed to bleach and rust cleaner fumes on 05/23/13 and has already been seen by his PCP. She was prescribed proventil, prednisone, and hycodan and pt is still reporting persistent coughing. She has associated sore throat and chest tightness since onset. States she has been having several episodes of diarrhea daily for the past 3 days. She denies seeing any blood or blackness to her stools. She has tried to use imodium, baking soda, alka-seltzer, pepto-bismol without any relief. Per pt, the diarrhea started when she started taking the hycodan cough syrup. She denies fever, current abdominal pain. She denies any recent foreign travel.  Denies fever or chills.     Past Medical History  Diagnosis Date  . Hypertension     History reviewed. No pertinent past surgical history.  No family history on file.  History  Substance Use Topics  . Smoking status: Former Games developer  . Smokeless tobacco: Not on file  . Alcohol Use: No    OB History   Grav Para Term Preterm Abortions TAB SAB Ect Mult Living                  Review of Systems  Constitutional: Negative for fever.  HENT: Positive for sore throat.   Respiratory: Positive for cough. Negative for shortness of breath.    Gastrointestinal: Positive for nausea and diarrhea. Negative for abdominal pain and blood in stool.  All other systems reviewed and are negative.    Allergies  Review of patient's allergies indicates not on file.  Home Medications  No current outpatient prescriptions on file.  BP 144/84  Pulse 105  Temp(Src) 98.7 F (37.1 C) (Oral)  Resp 24  SpO2 98%  Physical Exam  Nursing note and vitals reviewed. Constitutional: She appears well-developed and well-nourished.  HENT:  Head: Normocephalic and atraumatic.  Mouth/Throat: Oropharynx is clear and moist. No oropharyngeal exudate.  No exudate, erythema, or edema.   Eyes: EOM are normal. Pupils are equal, round, and reactive to light.  Neck: Normal range of motion. Neck supple.  Cardiovascular: Normal rate, regular rhythm and normal heart sounds.   Pulmonary/Chest: Effort normal. No respiratory distress. She has wheezes.  Slight, late expiratory wheeze at the bases of both lungs.  Musculoskeletal: Normal range of motion.  Neurological: She is alert.  Skin: Skin is warm and dry.  Psychiatric: She has a normal mood and affect. Her behavior is normal.    ED Course  Procedures (including critical care time)  DIAGNOSTIC STUDIES: Oxygen Saturation is 98% on room air, normal by my interpretation.    COORDINATION OF CARE: 4:13 PM Discussed treatment plan with pt at bedside and pt agreed to plan.   4:25 PM Pt offered chest XRAY but declined.  States she has appt at 9 am tomorrow to obtain second XRAY.   4:48 PM Reassess pt, she reports she is feeling better. Lungs clear to auscultation bilaterally.   Labs Reviewed - No data to display No results found.   No diagnosis found.    MDM  Patient presents with a chief complaint of cough x 5 days.  Patient given Albuterol neb and symptoms improved.  No signs of respiratory distress.  Patient reports that she had an appointment tomorrow with PCP.  Patient stable for discharge.  I  personally performed the services described in this documentation, which was scribed in my presence. The recorded information has been reviewed and is accurate.   Pascal Lux Inez, PA-C 05/31/13 1154

## 2013-06-02 NOTE — ED Provider Notes (Signed)
Medical screening examination/treatment/procedure(s) were performed by non-physician practitioner and as supervising physician I was immediately available for consultation/collaboration.   Velecia Ovitt H Fatim Vanderschaaf, MD 06/02/13 0921 

## 2013-10-26 ENCOUNTER — Encounter (HOSPITAL_COMMUNITY): Payer: Self-pay | Admitting: Emergency Medicine

## 2013-10-26 ENCOUNTER — Emergency Department (INDEPENDENT_AMBULATORY_CARE_PROVIDER_SITE_OTHER)
Admission: EM | Admit: 2013-10-26 | Discharge: 2013-10-26 | Disposition: A | Discharge status: Home / Self Care | Payer: BC Managed Care – PPO | Source: Home / Self Care | Attending: Family Medicine | Admitting: Family Medicine

## 2013-10-26 DIAGNOSIS — I10 Essential (primary) hypertension: Secondary | ICD-10-CM

## 2013-10-26 DIAGNOSIS — S39012A Strain of muscle, fascia and tendon of lower back, initial encounter: Secondary | ICD-10-CM

## 2013-10-26 DIAGNOSIS — S335XXA Sprain of ligaments of lumbar spine, initial encounter: Secondary | ICD-10-CM

## 2013-10-26 HISTORY — DX: Disorder of kidney and ureter, unspecified: N28.9

## 2013-10-26 HISTORY — DX: Bradycardia, unspecified: R00.1

## 2013-10-26 HISTORY — DX: Frequency of micturition: R35.0

## 2013-10-26 HISTORY — DX: Gastro-esophageal reflux disease without esophagitis: K21.9

## 2013-10-26 LAB — POCT URINALYSIS DIP (DEVICE)
Bilirubin Urine: NEGATIVE
Glucose, UA: NEGATIVE mg/dL
Hgb urine dipstick: NEGATIVE
Ketones, ur: NEGATIVE mg/dL
Leukocytes, UA: NEGATIVE
Nitrite: NEGATIVE
Protein, ur: NEGATIVE mg/dL
Specific Gravity, Urine: 1.02 (ref 1.005–1.030)
Urobilinogen, UA: 0.2 mg/dL (ref 0.0–1.0)
pH: 7.5 (ref 5.0–8.0)

## 2013-10-26 LAB — POCT I-STAT, CHEM 8
BUN: 16 mg/dL (ref 6–23)
Calcium, Ion: 1.25 mmol/L — ABNORMAL HIGH (ref 1.12–1.23)
Chloride: 107 mEq/L (ref 96–112)
Creatinine, Ser: 1 mg/dL (ref 0.50–1.10)
Glucose, Bld: 101 mg/dL — ABNORMAL HIGH (ref 70–99)
HCT: 44 % (ref 36.0–46.0)
Hemoglobin: 15 g/dL (ref 12.0–15.0)
Potassium: 3.4 mEq/L — ABNORMAL LOW (ref 3.5–5.1)
Sodium: 144 mEq/L (ref 135–145)
TCO2: 24 mmol/L (ref 0–100)

## 2013-10-26 MED ORDER — AMLODIPINE-VALSARTAN-HCTZ 5-160-12.5 MG PO TABS: 1.0000 | ORAL_TABLET | Freq: Every morning | ORAL | Status: DC

## 2013-10-26 MED ORDER — CYCLOBENZAPRINE HCL 5 MG PO TABS: 5.0000 mg | ORAL_TABLET | Freq: Three times a day (TID) | ORAL | Status: DC | PRN

## 2013-10-26 NOTE — ED Provider Notes (Signed)
CSN: [id number]     Arrival date & time 10/26/13  1607 History   First MD Initiated Contact with Patient 10/26/13 1713     Chief Complaint  Patient presents with  . Hypertension  . Back Pain   (Consider location/radiation/quality/duration/timing/severity/associated sxs/prior Treatment) Patient is a 57 y.o. female presenting with hypertension and back pain. The history is provided by the patient.  Hypertension This is a chronic problem. The current episode started more than 1 week ago. The problem has been gradually worsening. Pertinent negatives include no chest pain, no abdominal pain, no headaches and no shortness of breath.  Back Pain Location:  Lumbar spine Quality:  Stiffness Radiates to:  Does not radiate Pain severity:  Mild Onset quality:  Gradual Duration:  10 days Progression:  Unchanged Chronicity:  New Context: twisting   Context: not recent injury   Associated symptoms: no abdominal pain, no abdominal swelling, no chest pain, no fever, no headaches and no leg pain     Past Medical History  Diagnosis Date  . Hypertension   . GERD (gastroesophageal reflux disease)   . Renal disorder     3 kidney stones  . Urinary frequency   . Sinus bradycardia seen on cardiac monitor    Past Surgical History  Procedure Laterality Date  . Abdominal hysterectomy  1981    partial  . Knee surgery Right 2013    cleaned out damaged tissue and arthritis  . Lithotripsy  2003, 2006    x 2  . Kidney stone removal Left 1999   Family History  Problem Relation Age of Onset  . Aneurysm Mother   . Alzheimer's disease Father    History  Substance Use Topics  . Smoking status: Current Every Day Smoker -- 0.50 packs/day    Types: Cigarettes  . Smokeless tobacco: Not on file  . Alcohol Use: No   OB History   Grav Para Term Preterm Abortions TAB SAB Ect Mult Living                 Review of Systems  Constitutional: Negative.  Negative for fever.  Respiratory: Negative for  shortness of breath.   Cardiovascular: Negative for chest pain.  Gastrointestinal: Negative for abdominal pain.  Musculoskeletal: Positive for back pain. Negative for gait problem and myalgias.  Skin: Negative.   Neurological: Negative for headaches.    Allergies  Review of patient's allergies indicates no known allergies.  Home Medications   Current Outpatient Rx  Name  Route  Sig  Dispense  Refill  . aspirin EC 81 MG tablet   Oral   Take 81 mg by mouth daily.         . Ginger, Zingiber officinalis, (GINGER ROOT) 550 MG CAPS   Oral   Take 550 mg by mouth daily.         Marland Kitchen omeprazole (PRILOSEC) 10 MG capsule   Oral   Take 10 mg by mouth daily.         . phenazopyridine (PYRIDIUM) 95 MG tablet   Oral   Take 95 mg by mouth 3 (three) times daily as needed for pain.         Marland Kitchen albuterol (PROVENTIL HFA;VENTOLIN HFA) 108 (90 BASE) MCG/ACT inhaler   Inhalation   Inhale 2 puffs into the lungs every 6 (six) hours as needed for wheezing.         Marland Kitchen amLODipine-valsartan (EXFORGE) 5-160 MG per tablet   Oral   Take 1 tablet by  mouth daily.         Marland Kitchen HYDROcodone-homatropine (HYCODAN) 5-1.5 MG/5ML syrup   Oral   Take 5 mLs by mouth every 6 (six) hours as needed for cough.         . metoprolol (LOPRESSOR) 100 MG tablet   Oral   Take 150 mg by mouth daily.         . predniSONE (DELTASONE) 20 MG tablet   Oral   Take 40 mg by mouth daily. For 5 days.  Started 05/24/13          BP 191/85  Pulse 97  Temp(Src) 98.3 F (36.8 C) (Oral)  Resp 16  SpO2 100% Physical Exam  Nursing note and vitals reviewed. Constitutional: She is oriented to person, place, and time. She appears well-developed and well-nourished.  Cardiovascular: Normal rate and regular rhythm.   Pulmonary/Chest: Effort normal and breath sounds normal.  Musculoskeletal: She exhibits tenderness.       Lumbar back: She exhibits tenderness and spasm. She exhibits normal range of motion, no bony  tenderness and normal pulse.  Neurological: She is alert and oriented to person, place, and time.  Skin: Skin is dry.    ED Course  Procedures (including critical care time) Labs Review Labs Reviewed  POCT URINALYSIS DIP (DEVICE)   Imaging Review No results found.  EKG Interpretation     Ventricular Rate:    PR Interval:    QRS Duration:   QT Interval:    QTC Calculation:   R Axis:     Text Interpretation:              MDM      Linna Hoff, MD 10/26/13 1757

## 2013-10-26 NOTE — ED Notes (Signed)
C/o back pain onset 10 days ago- the only time she has it when she has a kidney stones.  ( 3 stones - 1 removed surgically and 2 lithotripsy).  No hematuria.  No known injury.  Ran out of her BP medicine in June.  Pressure started going up 1 week ago.

## 2014-01-16 ENCOUNTER — Ambulatory Visit: Payer: BC Managed Care – PPO

## 2014-01-16 ENCOUNTER — Ambulatory Visit (INDEPENDENT_AMBULATORY_CARE_PROVIDER_SITE_OTHER): Payer: BC Managed Care – PPO | Admitting: Emergency Medicine

## 2014-01-16 VITALS — BP 172/110 | HR 128 | Temp 98.0°F | Resp 18 | Ht 65.5 in | Wt 203.0 lb

## 2014-01-16 DIAGNOSIS — J209 Acute bronchitis, unspecified: Secondary | ICD-10-CM

## 2014-01-16 DIAGNOSIS — R059 Cough, unspecified: Secondary | ICD-10-CM

## 2014-01-16 DIAGNOSIS — R112 Nausea with vomiting, unspecified: Secondary | ICD-10-CM

## 2014-01-16 DIAGNOSIS — K219 Gastro-esophageal reflux disease without esophagitis: Secondary | ICD-10-CM

## 2014-01-16 DIAGNOSIS — R05 Cough: Secondary | ICD-10-CM

## 2014-01-16 DIAGNOSIS — J329 Chronic sinusitis, unspecified: Secondary | ICD-10-CM

## 2014-01-16 LAB — COMPREHENSIVE METABOLIC PANEL
ALT: 8 U/L (ref 0–35)
AST: 15 U/L (ref 0–37)
Albumin: 4.6 g/dL (ref 3.5–5.2)
Alkaline Phosphatase: 125 U/L — ABNORMAL HIGH (ref 39–117)
BUN: 13 mg/dL (ref 6–23)
CO2: 24 mEq/L (ref 19–32)
Calcium: 10.3 mg/dL (ref 8.4–10.5)
Chloride: 104 mEq/L (ref 96–112)
Creat: 0.83 mg/dL (ref 0.50–1.10)
Glucose, Bld: 77 mg/dL (ref 70–99)
Potassium: 4.4 mEq/L (ref 3.5–5.3)
Sodium: 138 mEq/L (ref 135–145)
Total Bilirubin: 0.4 mg/dL (ref 0.2–1.2)
Total Protein: 7.5 g/dL (ref 6.0–8.3)

## 2014-01-16 LAB — POCT CBC
Granulocyte percent: 54.1 %G (ref 37–80)
HCT, POC: 43.7 % (ref 37.7–47.9)
Hemoglobin: 13.5 g/dL (ref 12.2–16.2)
Lymph, poc: 3.6 — AB (ref 0.6–3.4)
MCH, POC: 28.1 pg (ref 27–31.2)
MCHC: 30.9 g/dL — AB (ref 31.8–35.4)
MCV: 91 fL (ref 80–97)
MID (cbc): 0.5 (ref 0–0.9)
MPV: 7.8 fL (ref 0–99.8)
POC Granulocyte: 4.9 (ref 2–6.9)
POC LYMPH PERCENT: 40.1 %L (ref 10–50)
POC MID %: 5.8 %M (ref 0–12)
Platelet Count, POC: 331 10*3/uL (ref 142–424)
RBC: 4.8 M/uL (ref 4.04–5.48)
RDW, POC: 15.4 %
WBC: 9.1 10*3/uL (ref 4.6–10.2)

## 2014-01-16 MED ORDER — LEVOFLOXACIN 500 MG PO TABS: 500.0000 mg | ORAL_TABLET | Freq: Every day | ORAL | Status: AC

## 2014-01-16 MED ORDER — IPRATROPIUM BROMIDE 0.02 % IN SOLN
0.5000 mg | Freq: Once | RESPIRATORY_TRACT | Status: AC
  Administered 2014-01-16: 0.5 mg via RESPIRATORY_TRACT

## 2014-01-16 MED ORDER — ALBUTEROL SULFATE (2.5 MG/3ML) 0.083% IN NEBU
2.5000 mg | INHALATION_SOLUTION | Freq: Once | RESPIRATORY_TRACT | Status: AC
  Administered 2014-01-16: 2.5 mg via RESPIRATORY_TRACT

## 2014-01-16 MED ORDER — ALBUTEROL SULFATE HFA 108 (90 BASE) MCG/ACT IN AERS: 2.0000 | INHALATION_SPRAY | RESPIRATORY_TRACT | Status: DC | PRN

## 2014-01-16 MED ORDER — HYDROCOD POLST-CHLORPHEN POLST 10-8 MG/5ML PO LQCR: 5.0000 mL | Freq: Two times a day (BID) | ORAL | Status: DC | PRN

## 2014-01-16 MED ORDER — ALBUTEROL SULFATE (2.5 MG/3ML) 0.083% IN NEBU
2.5000 mg | INHALATION_SOLUTION | Freq: Once | RESPIRATORY_TRACT | Status: AC
Start: 2014-01-16 — End: 2014-01-16
  Administered 2014-01-16: 2.5 mg via RESPIRATORY_TRACT

## 2014-01-16 NOTE — Progress Notes (Signed)
[facility] [address], [place] [address] [phone]  Date:  01/16/2014   Name:  Amber Meyer   DOB:  10/03/1956   MRN:  [id number]  PCP:  Maggie Font, MD    Chief Complaint: Cough and Nasal Congestion   History of Present Illness:  Amber Meyer is a 58 y.o. very pleasant female patient who presents with the following:  Ill since around Lyn[place] with nasal congestion and postnasal drainage.  Has a cough that is not productive.  Has some post nasal purulent drainage.  Says she can't eat or hold soup or other food down without vomiting.  Has 6 pound weight loss.  No nausea or vomiting.  No heartburn but has reflux she treats with OTC prilosec.  No blood in sputum, emesis or stool.  No fever or chills.  Has marked mucoid nasal drainage and post nasal drip.    There are no active problems to display for this patient.   Past Medical History  Diagnosis Date  . Hypertension   . GERD (gastroesophageal reflux disease)   . Renal disorder     3 kidney stones  . Urinary frequency   . Sinus bradycardia seen on cardiac monitor     Past Surgical History  Procedure Laterality Date  . Abdominal hysterectomy  1981    partial  . Knee surgery Right 2013    cleaned out damaged tissue and arthritis  . Lithotripsy  2003, 2006    x 2  . Kidney stone removal Left 1999    History  Substance Use Topics  . Smoking status: Current Every Day Smoker -- 0.50 packs/day    Types: Cigarettes  . Smokeless tobacco: Not on file  . Alcohol Use: No    Family History  Problem Relation Age of Onset  . Aneurysm Mother   . Alzheimer's disease Father     No Known Allergies  Medication list has been reviewed and updated.  Current Outpatient Prescriptions on File Prior to Visit  Medication Sig Dispense Refill  . Amlodipine-Valsartan-HCTZ (EXFORGE HCT) 5-160-12.5 MG TABS Take 1 tablet by mouth every morning.  30 tablet  1  . aspirin EC 81 MG tablet Take 81  mg by mouth daily.      . Ginger, Zingiber officinalis, (GINGER ROOT) 550 MG CAPS Take 550 mg by mouth daily.      . metoprolol (LOPRESSOR) 100 MG tablet Take 150 mg by mouth daily.      Marland Kitchen omeprazole (PRILOSEC) 10 MG capsule Take 10 mg by mouth daily.      Marland Kitchen albuterol (PROVENTIL HFA;VENTOLIN HFA) 108 (90 BASE) MCG/ACT inhaler Inhale 2 puffs into the lungs every 6 (six) hours as needed for wheezing.      . cyclobenzaprine (FLEXERIL) 5 MG tablet Take 1 tablet (5 mg total) by mouth 3 (three) times daily as needed for muscle spasms.  30 tablet  0  . HYDROcodone-homatropine (HYCODAN) 5-1.5 MG/5ML syrup Take 5 mLs by mouth every 6 (six) hours as needed for cough.       No current facility-administered medications on file prior to visit.    Review of Systems:  As per HPI, otherwise negative.    Physical Examination: Filed Vitals:   01/16/14 1024  BP: 172/110  Pulse: 128  Temp: 98 F (36.7 C)  Resp: 18   Filed Vitals:   01/16/14 1024  Height: 5' 5.5" (1.664 m)  Weight: 203 lb (92.08 kg)   Body  mass index is 33.26 kg/(m^2). Ideal Body Weight: Weight in (lb) to have BMI = 25: 152.2  GEN: obese, NAD, Non-toxic, A & O x 3  Persistent cough HEENT: Atraumatic, Normocephalic. Neck supple. No masses, No LAD. Ears and Nose: No external deformity. CV: RRR, No M/G/R. No JVD. No thrill. No extra heart sounds. PULM: CTA B, no wheezes, crackles, rhonchi. No retractions. No resp. distress. No accessory muscle use. ABD: S, NT, ND, +BS. No rebound. No HSM. EXTR: No c/c/e NEURO Normal gait.  PSYCH: Normally interactive. Conversant. Not depressed or anxious appearing.  Calm demeanor.    Assessment and Plan: Bronchospasm with cough Sinusitis levaquin tussionex Albuterol   Signed,  Ellison Carwin, MD

## 2014-01-16 NOTE — Patient Instructions (Signed)
Gastroesophageal Reflux Disease, Adult  Gastroesophageal reflux disease (GERD) happens when acid from your stomach flows up into the esophagus. When acid comes in contact with the esophagus, the acid causes soreness (inflammation) in the esophagus. Over time, GERD may create small holes (ulcers) in the lining of the esophagus.  CAUSES   · Increased body weight. This puts pressure on the stomach, making acid rise from the stomach into the esophagus.  · Smoking. This increases acid production in the stomach.  · Drinking alcohol. This causes decreased pressure in the lower esophageal sphincter (valve or ring of muscle between the esophagus and stomach), allowing acid from the stomach into the esophagus.  · Late evening meals and a full stomach. This increases pressure and acid production in the stomach.  · A malformed lower esophageal sphincter.  Sometimes, no cause is found.  SYMPTOMS   · Burning pain in the lower part of the mid-chest behind the breastbone and in the mid-stomach area. This may occur twice a week or more often.  · Trouble swallowing.  · Sore throat.  · Dry cough.  · Asthma-like symptoms including chest tightness, shortness of breath, or wheezing.  DIAGNOSIS   Your caregiver may be able to diagnose GERD based on your symptoms. In some cases, X-rays and other tests may be done to check for complications or to check the condition of your stomach and esophagus.  TREATMENT   Your caregiver may recommend over-the-counter or prescription medicines to help decrease acid production. Ask your caregiver before starting or adding any new medicines.   HOME CARE INSTRUCTIONS   · Change the factors that you can control. Ask your caregiver for guidance concerning weight loss, quitting smoking, and alcohol consumption.  · Avoid foods and drinks that make your symptoms worse, such as:  · Caffeine or alcoholic drinks.  · Chocolate.  · Peppermint or mint flavorings.  · Garlic and onions.  · Spicy foods.  · Citrus fruits,  such as oranges, lemons, or limes.  · Tomato-based foods such as sauce, chili, salsa, and pizza.  · Fried and fatty foods.  · Avoid lying down for the 3 hours prior to your bedtime or prior to taking a nap.  · Eat small, frequent meals instead of large meals.  · Wear loose-fitting clothing. Do not wear anything tight around your waist that causes pressure on your stomach.  · Raise the head of your bed 6 to 8 inches with wood blocks to help you sleep. Extra pillows will not help.  · Only take over-the-counter or prescription medicines for pain, discomfort, or fever as directed by your caregiver.  · Do not take aspirin, ibuprofen, or other nonsteroidal anti-inflammatory drugs (NSAIDs).  SEEK IMMEDIATE MEDICAL CARE IF:   · You have pain in your arms, neck, jaw, teeth, or back.  · Your pain increases or changes in intensity or duration.  · You develop nausea, vomiting, or sweating (diaphoresis).  · You develop shortness of breath, or you faint.  · Your vomit is green, yellow, black, or looks like coffee grounds or blood.  · Your stool is red, bloody, or black.  These symptoms could be signs of other problems, such as heart disease, gastric bleeding, or esophageal bleeding.  MAKE SURE YOU:   · Understand these instructions.  · Will watch your condition.  · Will get help right away if you are not doing well or get worse.  Document Released: 09/09/2005 Document Revised: 02/22/2012 Document Reviewed: 06/19/2011  [employer]® Patient   Information ©2014 [employer].

## 2014-01-17 ENCOUNTER — Other Ambulatory Visit: Payer: Self-pay | Admitting: Emergency Medicine

## 2014-01-17 LAB — H. PYLORI ANTIBODY, IGG: H Pylori IgG: 6.53 {ISR} — ABNORMAL HIGH

## 2014-01-17 MED ORDER — AMOXICILL-CLARITHRO-LANSOPRAZ PO MISC: Freq: Two times a day (BID) | ORAL | Status: DC

## 2014-02-21 ENCOUNTER — Encounter: Payer: Self-pay | Admitting: Emergency Medicine

## 2014-02-22 ENCOUNTER — Other Ambulatory Visit: Payer: Self-pay | Admitting: Internal Medicine

## 2014-02-22 DIAGNOSIS — M7989 Other specified soft tissue disorders: Secondary | ICD-10-CM

## 2014-02-22 DIAGNOSIS — M79605 Pain in left leg: Secondary | ICD-10-CM

## 2014-02-23 ENCOUNTER — Ambulatory Visit
Admission: RE | Admit: 2014-02-23 | Discharge: 2014-02-23 | Disposition: A | Discharge status: Home / Self Care | Payer: BC Managed Care – PPO | Source: Ambulatory Visit | Attending: Internal Medicine | Admitting: Internal Medicine

## 2014-02-23 DIAGNOSIS — M7989 Other specified soft tissue disorders: Secondary | ICD-10-CM

## 2014-02-23 DIAGNOSIS — M79605 Pain in left leg: Secondary | ICD-10-CM

## 2015-01-16 ENCOUNTER — Encounter (HOSPITAL_COMMUNITY): Payer: Self-pay | Admitting: Emergency Medicine

## 2015-01-16 ENCOUNTER — Emergency Department (HOSPITAL_COMMUNITY)
Admission: EM | Admit: 2015-01-16 | Discharge: 2015-01-16 | Disposition: A | Discharge status: Home / Self Care | Payer: BC Managed Care – PPO | Attending: Emergency Medicine | Admitting: Emergency Medicine

## 2015-01-16 DIAGNOSIS — K219 Gastro-esophageal reflux disease without esophagitis: Secondary | ICD-10-CM | POA: Diagnosis not present

## 2015-01-16 DIAGNOSIS — Z792 Long term (current) use of antibiotics: Secondary | ICD-10-CM | POA: Insufficient documentation

## 2015-01-16 DIAGNOSIS — Z87448 Personal history of other diseases of urinary system: Secondary | ICD-10-CM | POA: Insufficient documentation

## 2015-01-16 DIAGNOSIS — Z7982 Long term (current) use of aspirin: Secondary | ICD-10-CM | POA: Diagnosis not present

## 2015-01-16 DIAGNOSIS — Z87442 Personal history of urinary calculi: Secondary | ICD-10-CM | POA: Diagnosis not present

## 2015-01-16 DIAGNOSIS — Z720 Tobacco use: Secondary | ICD-10-CM | POA: Insufficient documentation

## 2015-01-16 DIAGNOSIS — I10 Essential (primary) hypertension: Secondary | ICD-10-CM | POA: Insufficient documentation

## 2015-01-16 DIAGNOSIS — M545 Low back pain, unspecified: Secondary | ICD-10-CM

## 2015-01-16 DIAGNOSIS — Z79899 Other long term (current) drug therapy: Secondary | ICD-10-CM | POA: Insufficient documentation

## 2015-01-16 LAB — URINALYSIS, ROUTINE W REFLEX MICROSCOPIC
Bilirubin Urine: NEGATIVE
Glucose, UA: NEGATIVE mg/dL
Hgb urine dipstick: NEGATIVE
Ketones, ur: NEGATIVE mg/dL
Leukocytes, UA: NEGATIVE
Nitrite: NEGATIVE
Protein, ur: NEGATIVE mg/dL
Specific Gravity, Urine: 1.024 (ref 1.005–1.030)
Urobilinogen, UA: 0.2 mg/dL (ref 0.0–1.0)
pH: 6 (ref 5.0–8.0)

## 2015-01-16 MED ORDER — METHOCARBAMOL 500 MG PO TABS: 500.0000 mg | ORAL_TABLET | Freq: Two times a day (BID) | ORAL | Status: DC

## 2015-01-16 MED ORDER — TRAMADOL HCL 50 MG PO TABS: 50.0000 mg | ORAL_TABLET | Freq: Four times a day (QID) | ORAL | Status: DC | PRN

## 2015-01-16 MED ORDER — IBUPROFEN 800 MG PO TABS
800.0000 mg | ORAL_TABLET | Freq: Once | ORAL | Status: AC
  Administered 2015-01-16: 800 mg via ORAL
  Filled 2015-01-16: qty 1

## 2015-01-16 MED ORDER — IBUPROFEN 400 MG PO TABS: 400.0000 mg | ORAL_TABLET | Freq: Four times a day (QID) | ORAL | Status: DC | PRN

## 2015-01-16 NOTE — Discharge Instructions (Signed)
Back Pain, Adult Low back pain is very common. About 1 in 5 people have back pain.The cause of low back pain is rarely dangerous. The pain often gets better over time.About half of people with a sudden onset of back pain feel better in just 2 weeks. About 8 in 10 people feel better by 6 weeks.  CAUSES Some common causes of back pain include:  Strain of the muscles or ligaments supporting the spine.  Wear and tear (degeneration) of the spinal discs.  Arthritis.  Direct injury to the back. DIAGNOSIS Most of the time, the direct cause of low back pain is not known.However, back pain can be treated effectively even when the exact cause of the pain is unknown.Answering your caregiver's questions about your overall health and symptoms is one of the most accurate ways to make sure the cause of your pain is not dangerous. If your caregiver needs more information, he or she may order lab work or imaging tests (X-rays or MRIs).However, even if imaging tests show changes in your back, this usually does not require surgery. HOME CARE INSTRUCTIONS For many people, back pain returns.Since low back pain is rarely dangerous, it is often a condition that people can learn to manageon their own.   Remain active. It is stressful on the back to sit or stand in one place. Do not sit, drive, or stand in one place for more than 30 minutes at a time. Take short walks on level surfaces as soon as pain allows.Try to increase the length of time you walk each day.  Do not stay in bed.Resting more than 1 or 2 days can delay your recovery.  Do not avoid exercise or work.Your body is made to move.It is not dangerous to be active, even though your back may hurt.Your back will likely heal faster if you return to being active before your pain is gone.  Pay attention to your body when you bend and lift. Many people have less discomfortwhen lifting if they bend their knees, keep the load close to their bodies,and  avoid twisting. Often, the most comfortable positions are those that put less stress on your recovering back.  Find a comfortable position to sleep. Use a firm mattress and lie on your side with your knees slightly bent. If you lie on your back, put a pillow under your knees.  Only take over-the-counter or prescription medicines as directed by your caregiver. Over-the-counter medicines to reduce pain and inflammation are often the most helpful.Your caregiver may prescribe muscle relaxant drugs.These medicines help dull your pain so you can more quickly return to your normal activities and healthy exercise.  Put ice on the injured area.  Put ice in a plastic bag.  Place a towel between your skin and the bag.  Leave the ice on for 15-20 minutes, 03-04 times a day for the first 2 to 3 days. After that, ice and heat may be alternated to reduce pain and spasms.  Ask your caregiver about trying back exercises and gentle massage. This may be of some benefit.  Avoid feeling anxious or stressed.Stress increases muscle tension and can worsen back pain.It is important to recognize when you are anxious or stressed and learn ways to manage it.Exercise is a great option. SEEK MEDICAL CARE IF:  You have pain that is not relieved with rest or medicine.  You have pain that does not improve in 1 week.  You have new symptoms.  You are generally not feeling well. SEEK   IMMEDIATE MEDICAL CARE IF:   You have pain that radiates from your back into your legs.  You develop new bowel or bladder control problems.  You have unusual weakness or numbness in your arms or legs.  You develop nausea or vomiting.  You develop abdominal pain.  You feel faint. Document Released: 11/30/2005 Document Revised: 05/31/2012 Document Reviewed: 04/03/2014 [employer] Patient Information 2015 [employer]. This information is not intended to replace advice given to you by your health care provider. Make sure you  discuss any questions you have with your health care provider.  

## 2015-01-16 NOTE — ED Notes (Signed)
Pt reports left lower back pain since Sunday. Pt states pain radiates down left leg. States pain increased the past 2 days while at work.

## 2015-01-16 NOTE — ED Provider Notes (Signed)
CSN: [id number]     Arrival date & time 01/16/15  0802 History   First MD Initiated Contact with Patient 01/16/15 [phone]     No chief complaint on file.    (Consider location/radiation/quality/duration/timing/severity/associated sxs/prior Treatment) HPI   59 year old with history of kidney stones, GERD and hypertension who presents for evaluation of back pain.  Patient works as a Secretary/administrator. For the past 4 days she has had intermittent low back pain. 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 Pain occasionally radiates down to both of her legs as far down as the toes. She has tried taking Motrin with some relief. No associated fever, lightheadedness, dizziness, cp, sob, abdominal pain, dysuria, hematuria, bowel bladder incontinence, saddle anesthesia, or rash. She denies any history of IV drug use of active cancer. She denies any specific trauma. No new numbness or weakness reported. She reports in the past she has history of kidney stones in the pain feels somewhat similar.   Past Medical History  Diagnosis Date  . Hypertension   . GERD (gastroesophageal reflux disease)   . Renal disorder     3 kidney stones  . Urinary frequency   . Sinus bradycardia seen on cardiac monitor    Past Surgical History  Procedure Laterality Date  . Abdominal hysterectomy  1981    partial  . Knee surgery Right 2013    cleaned out damaged tissue and arthritis  . Lithotripsy  2003, 2006    x 2  . Kidney stone removal Left 1999   Family History  Problem Relation Age of Onset  . Aneurysm Mother   . Alzheimer's disease Father    History  Substance Use Topics  . Smoking status: Current Every Day Smoker -- 0.50 packs/day    Types: Cigarettes  . Smokeless tobacco: Not on file  . Alcohol Use: No   OB History    No data available     Review of Systems  Constitutional: Negative for fever.  Musculoskeletal: Positive for back pain.  Neurological:  Negative for numbness.  All other systems reviewed and are negative.     Allergies  Review of patient's allergies indicates no known allergies.  Home Medications   Prior to Admission medications   Medication Sig Start Date End Date Taking? Authorizing Provider  albuterol (PROVENTIL HFA;VENTOLIN HFA) 108 (90 BASE) MCG/ACT inhaler Inhale 2 puffs into the lungs every 4 (four) hours as needed for wheezing. 01/16/14   Roselee Culver, MD  Amlodipine-Valsartan-HCTZ (EXFORGE HCT) 5-160-12.5 MG TABS Take 1 tablet by mouth every morning. 10/26/13   Billy Fischer, MD  amoxicillin-clarithromycin-lansoprazole [facility]) combo pack Take by mouth 2 (two) times daily. Follow package directions. Please dispense as generic components 01/17/14   Roselee Culver, MD  aspirin EC 81 MG tablet Take 81 mg by mouth daily.    Historical Provider, MD  chlorpheniramine-HYDROcodone (TUSSIONEX PENNKINETIC ER) 10-8 MG/5ML LQCR Take 5 mLs by mouth every 12 (twelve) hours as needed. 01/16/14   Roselee Culver, MD  cyclobenzaprine (FLEXERIL) 5 MG tablet Take 1 tablet (5 mg total) by mouth 3 (three) times daily as needed for muscle spasms. 10/26/13   Billy Fischer, MD  Ginger, Zingiber officinalis, (GINGER ROOT) 550 MG CAPS Take 550 mg by mouth daily.    Historical Provider, MD  HYDROcodone-homatropine (HYCODAN) 5-1.5 MG/5ML syrup Take 5 mLs by mouth every 6 (six) hours as needed for cough.    Historical Provider,  MD  metoprolol (LOPRESSOR) 100 MG tablet Take 150 mg by mouth daily.    Historical Provider, MD  omeprazole (PRILOSEC) 10 MG capsule Take 10 mg by mouth daily.    Historical Provider, MD   BP 138/71 mmHg  Pulse 90  Temp(Src) 98.7 F (37.1 C) (Oral)  Resp 22  SpO2 98% Physical Exam  Constitutional: She appears well-developed and well-nourished. No distress.  HENT:  Head: Atraumatic.  Eyes: Conjunctivae are normal.  Neck: Neck supple.  Abdominal: Soft. There is no tenderness.  Genitourinary:  No CVA  tenderness  Musculoskeletal: She exhibits tenderness (tenderness to lumbar and paraspinal muscle on palpation with increasing pain with flexion and rotation.). She exhibits no edema.  Neurological: She is alert.  Patellar deep tendon reflex intact bilaterally. Negative straight leg raise bilaterally. Intact distal pedal pulses normal toe dorsiflexion bilaterally.  Skin: No rash noted.  Psychiatric: She has a normal mood and affect.  Nursing note and vitals reviewed.   ED Course  Procedures (including critical care time)  8:29 AM Patient here with reproducible muscular skeletal back pain. No red flags. She is able to ambulate without difficulty. She is neurovascularly intact.  11:26 AM UA shows no evidence of urinary tract infection. No bloody urine to suggest kidney stone. She is able to ambulate without difficulty. Will treat symptoms, orthopedic referral given, return precautions discussed.  Labs Review Labs Reviewed  URINALYSIS, ROUTINE W REFLEX MICROSCOPIC    Imaging Review No results found.   EKG Interpretation None      MDM   Final diagnoses:  Left-sided low back pain without sciatica    BP 140/81 mmHg  Pulse 76  Temp(Src) 98.7 F (37.1 C) (Oral)  Resp 20  SpO2 100%     Domenic Moras, PA-C 01/16/15 [place], MD 01/16/15 1728

## 2015-01-16 NOTE — ED Notes (Signed)
Explained to pt that Gertie Fey was going to prescribe meds for home, pt ok with that.

## 2016-10-03 ENCOUNTER — Encounter (HOSPITAL_COMMUNITY): Payer: Self-pay | Admitting: Emergency Medicine

## 2016-10-03 ENCOUNTER — Ambulatory Visit (HOSPITAL_COMMUNITY)
Admission: EM | Admit: 2016-10-03 | Discharge: 2016-10-03 | Disposition: A | Discharge status: Home / Self Care | Payer: Worker's Compensation

## 2016-10-03 DIAGNOSIS — M545 Low back pain: Secondary | ICD-10-CM | POA: Diagnosis not present

## 2016-10-03 DIAGNOSIS — M25561 Pain in right knee: Secondary | ICD-10-CM

## 2016-10-03 DIAGNOSIS — W19XXXA Unspecified fall, initial encounter: Secondary | ICD-10-CM | POA: Diagnosis not present

## 2016-10-03 MED ORDER — DICLOFENAC SODIUM 75 MG PO TBEC: 75.0000 mg | DELAYED_RELEASE_TABLET | Freq: Two times a day (BID) | ORAL | 0 refills | Status: DC

## 2016-10-03 MED ORDER — KETOROLAC TROMETHAMINE 30 MG/ML IJ SOLN
INTRAMUSCULAR | Status: AC
  Filled 2016-10-03: qty 1

## 2016-10-03 MED ORDER — KETOROLAC TROMETHAMINE 30 MG/ML IJ SOLN: 30.0000 mg | Freq: Once | INTRAMUSCULAR | Status: DC

## 2016-10-03 MED ORDER — CLONAZEPAM 0.5 MG PO TABS: 0.5000 mg | ORAL_TABLET | Freq: Two times a day (BID) | ORAL | 0 refills | Status: DC | PRN

## 2016-10-03 MED ORDER — KETOROLAC TROMETHAMINE 30 MG/ML IJ SOLN
30.0000 mg | Freq: Once | INTRAMUSCULAR | Status: AC
  Administered 2016-10-03: 30 mg via INTRAMUSCULAR

## 2016-10-03 NOTE — ED Provider Notes (Signed)
CSN: [id number]     Arrival date & time 10/03/16  1420 History   First MD Initiated Contact with Patient 10/03/16 1622     Chief Complaint  Patient presents with  . Fall   (Consider location/radiation/quality/duration/timing/severity/associated sxs/prior Treatment) 60 y.o. female presents with  Injuries to right lower back and knee  X 2 days. Patient reports falling from a platform at work approximately 1 foot off the ground. Patient describes the pain  As "sore" and is able to bear weight on the affected extremity Condition is acute in nature. Condition is made better by nothing. Condition is made worse by movement. Patient denies any relief from ice and elevation  prior to there arrival at this facility. Patient denies any LOC or additional injuries related to the fall.       Past Medical History:  Diagnosis Date  . GERD (gastroesophageal reflux disease)   . Hypertension   . Renal disorder    3 kidney stones  . Sinus bradycardia seen on cardiac monitor   . Urinary frequency    Past Surgical History:  Procedure Laterality Date  . ABDOMINAL HYSTERECTOMY  1981   partial  . Kidney stone removal Left 1999  . KNEE SURGERY Right 2013   cleaned out damaged tissue and arthritis  . LITHOTRIPSY  2003, 2006   x 2   Family History  Problem Relation Age of Onset  . Aneurysm Mother   . Alzheimer's disease Father    Social History  Substance Use Topics  . Smoking status: Current Every Day Smoker    Packs/day: 0.50    Types: Cigarettes  . Smokeless tobacco: Not on file  . Alcohol use No   OB History    No data available     Review of Systems  Constitutional: Negative.     Allergies  Review of patient's allergies indicates no known allergies.  Home Medications   Prior to Admission medications   Medication Sig Start Date End Date Taking? Authorizing Provider  Amlodipine-Valsartan-HCTZ (EXFORGE HCT) 5-160-12.5 MG TABS Take 1 tablet by mouth every morning. 10/26/13  Yes  Billy Fischer, MD  aspirin EC 81 MG tablet Take 81 mg by mouth daily.   Yes Historical Provider, MD  Dexlansoprazole (DEXILANT PO) Take by mouth.   Yes Historical Provider, MD  metoprolol (LOPRESSOR) 100 MG tablet Take 150 mg by mouth daily.   Yes Historical Provider, MD  MYRBETRIQ 25 MG TB24 tablet Take 25 mg by mouth daily. 01/14/15  Yes Historical Provider, MD  albuterol (PROVENTIL HFA;VENTOLIN HFA) 108 (90 BASE) MCG/ACT inhaler Inhale 2 puffs into the lungs every 4 (four) hours as needed for wheezing. 01/16/14   Roselee Culver, MD  Amlodipine-Valsartan-HCTZ [phone] MG TABS Take 1 tablet by mouth daily. 12/10/14   Historical Provider, MD  amoxicillin-clarithromycin-lansoprazole [facility]) combo pack Take by mouth 2 (two) times daily. Follow package directions. Please dispense as generic components Patient not taking: Reported on 01/16/2015 01/17/14   Roselee Culver, MD  chlorpheniramine-HYDROcodone [facility] PENNKINETIC ER) 10-8 MG/5ML LQCR Take 5 mLs by mouth every 12 (twelve) hours as needed. Patient not taking: Reported on 01/16/2015 01/16/14   Roselee Culver, MD  clonazePAM (KLONOPIN) 0.5 MG tablet Take 1 tablet (0.5 mg total) by mouth 2 (two) times daily as needed for anxiety. 10/03/16   Jacqualine Mau, NP  diclofenac (VOLTAREN) 75 MG EC tablet Take 1 tablet (75 mg total) by mouth 2 (two) times daily. 10/03/16   Jacqualine Mau,  NP  Ginger, Zingiber officinalis, (GINGER ROOT) 550 MG CAPS Take 550 mg by mouth daily.    Historical Provider, MD  ibuprofen (ADVIL,MOTRIN) 400 MG tablet Take 1 tablet (400 mg total) by mouth every 6 (six) hours as needed for mild pain. 01/16/15   Domenic Moras, PA-C  methocarbamol (ROBAXIN) 500 MG tablet Take 1 tablet (500 mg total) by mouth 2 (two) times daily. 01/16/15   Domenic Moras, PA-C  omeprazole (PRILOSEC) 10 MG capsule Take 10 mg by mouth daily.    Historical Provider, MD  traMADol (ULTRAM) 50 MG tablet Take 1 tablet (50 mg total) by mouth every 6  (six) hours as needed. 01/16/15   Domenic Moras, PA-C   Meds Ordered and Administered this Visit   Medications  ketorolac (TORADOL) 30 MG/ML injection 30 mg (30 mg Intramuscular Given 10/03/16 1626)    BP 147/65 (BP Location: Right Arm)   Pulse 81   Temp 98.3 F (36.8 C) (Oral)   Resp 20   SpO2 100%  No data found.   Physical Exam  Constitutional: She is oriented to person, place, and time. She appears well-developed and well-nourished.  HENT:  Head: Normocephalic and atraumatic.  Eyes: Conjunctivae are normal.  Neck: Normal range of motion.  Pulmonary/Chest: Effort normal.  Musculoskeletal: Normal range of motion. She exhibits tenderness ( to lower back with palpation).  Neurological: She is alert and oriented to person, place, and time.  Skin: Skin is warm.  Psychiatric: She has a normal mood and affect.  Nursing note and vitals reviewed.   Urgent Care Course   Clinical Course    Procedures (including critical care time)  Labs Review Labs Reviewed - No data to display  Imaging Review No results found.           MDM   1. Fall, initial encounter        Jacqualine Mau, NP 10/03/16 1640

## 2016-10-03 NOTE — ED Triage Notes (Signed)
The patient presented to the [facility] with a complaint of right sided back pain and right knee pain secondary to a fall that occurred at work 3 days ago.

## 2016-10-06 ENCOUNTER — Other Ambulatory Visit: Payer: Self-pay | Admitting: Family Medicine

## 2016-10-06 ENCOUNTER — Ambulatory Visit
Admission: RE | Admit: 2016-10-06 | Discharge: 2016-10-06 | Disposition: A | Discharge status: Home / Self Care | Payer: Worker's Compensation | Source: Ambulatory Visit | Attending: Family Medicine | Admitting: Family Medicine

## 2016-10-06 DIAGNOSIS — T1490XA Injury, unspecified, initial encounter: Secondary | ICD-10-CM

## 2017-05-05 LAB — GLUCOSE, POCT (MANUAL RESULT ENTRY): POC Glucose: 99 mg/dl (ref 70–99)

## 2017-11-30 ENCOUNTER — Ambulatory Visit: Payer: Self-pay | Admitting: Orthopedic Surgery

## 2017-11-30 ENCOUNTER — Encounter (HOSPITAL_COMMUNITY): Payer: Self-pay | Admitting: *Deleted

## 2017-11-30 NOTE — Progress Notes (Signed)
NEED ORDERS IN Epic ASAP FOR 12-23-17 SURGERY

## 2017-12-16 NOTE — Progress Notes (Signed)
11-24-17 EKG and surgical clearance from Dr. Ardeth Perfect on chart

## 2017-12-16 NOTE — Patient Instructions (Addendum)
Amber Meyer  12/16/2017   Your procedure is scheduled on: 12-23-17   Report to [facility] Main  Entrance Report to admitting at 08:00 AM   Call this number if you have problems the morning of surgery [phone]    Do not eat food or drink liquids :After Midnight.     Take these medicines the morning of surgery with A SIP OF WATER: Dexlansoprazole (Dexilant)                                You may not have any metal on your body including hair pins and              piercings  Do not wear jewelry, make-up, lotions, powders or perfumes, deodorant             Do not wear nail polish.  Do not shave  48 hours prior to surgery.                Do not bring valuables to the hospital. [place].  Contacts, dentures or bridgework may not be worn into surgery.  Leave suitcase in the car. After surgery it may be brought to your room.                 Please read over the following fact sheets you were given: _____________________________________________________________________             [facility] - Preparing for Surgery Before surgery, you can play an important role.  Because skin is not sterile, your skin needs to be as free of germs as possible.  You can reduce the number of germs on your skin by washing with CHG (chlorahexidine gluconate) soap before surgery.  CHG is an antiseptic cleaner which kills germs and bonds with the skin to continue killing germs even after washing. Please DO NOT use if you have an allergy to CHG or antibacterial soaps.  If your skin becomes reddened/irritated stop using the CHG and inform your nurse when you arrive at Short Stay. Do not shave (including legs and underarms) for at least 48 hours prior to the first CHG shower.  You may shave your face/neck. Please follow these instructions carefully:  1.  Shower with CHG Soap the night before surgery and the  morning of Surgery.  2.   If you choose to wash your hair, wash your hair first as usual with your  normal  shampoo.  3.  After you shampoo, rinse your hair and body thoroughly to remove the  shampoo.                           4.  Use CHG as you would any other liquid soap.  You can apply chg directly  to the skin and wash                       Gently with a scrungie or clean washcloth.  5.  Apply the CHG Soap to your body ONLY FROM THE NECK DOWN.   Do not use on face/ open  Wound or open sores. Avoid contact with eyes, ears mouth and genitals (private parts).                       Wash face,  Genitals (private parts) with your normal soap.             6.  Wash thoroughly, paying special attention to the area where your surgery  will be performed.  7.  Thoroughly rinse your body with warm water from the neck down.  8.  DO NOT shower/wash with your normal soap after using and rinsing off  the CHG Soap.                9.  Pat yourself dry with a clean towel.            10.  Wear clean pajamas.            11.  Place clean sheets on your bed the night of your first shower and do not  sleep with pets. Day of Surgery : Do not apply any lotions/deodorants the morning of surgery.  Please wear clean clothes to the hospital/surgery center.  FAILURE TO FOLLOW THESE INSTRUCTIONS MAY RESULT IN THE CANCELLATION OF YOUR SURGERY PATIENT SIGNATURE_________________________________  NURSE SIGNATURE__________________________________  ________________________________________________________________________   Adam Phenix  An incentive spirometer is a tool that can help keep your lungs clear and active. This tool measures how well you are filling your lungs with each breath. Taking long deep breaths may help reverse or decrease the chance of developing breathing (pulmonary) problems (especially infection) following:  A long period of time when you are unable to move or be active. BEFORE THE PROCEDURE    If the spirometer includes an indicator to show your best effort, your nurse or respiratory therapist will set it to a desired goal.  If possible, sit up straight or lean slightly forward. Try not to slouch.  Hold the incentive spirometer in an upright position. INSTRUCTIONS FOR USE  1. Sit on the edge of your bed if possible, or sit up as far as you can in bed or on a chair. 2. Hold the incentive spirometer in an upright position. 3. Breathe out normally. 4. Place the mouthpiece in your mouth and seal your lips tightly around it. 5. Breathe in slowly and as deeply as possible, raising the piston or the ball toward the top of the column. 6. Hold your breath for 3-5 seconds or for as long as possible. Allow the piston or ball to fall to the bottom of the column. 7. Remove the mouthpiece from your mouth and breathe out normally. 8. Rest for a few seconds and repeat Steps 1 through 7 at least 10 times every 1-2 hours when you are awake. Take your time and take a few normal breaths between deep breaths. 9. The spirometer may include an indicator to show your best effort. Use the indicator as a goal to work toward during each repetition. 10. After each set of 10 deep breaths, practice coughing to be sure your lungs are clear. If you have an incision (the cut made at the time of surgery), support your incision when coughing by placing a pillow or rolled up towels firmly against it. Once you are able to get out of bed, walk around indoors and cough well. You may stop using the incentive spirometer when instructed by your caregiver.  RISKS AND COMPLICATIONS  Take your time so you do not get  dizzy or light-headed.  If you are in pain, you may need to take or ask for pain medication before doing incentive spirometry. It is harder to take a deep breath if you are having pain. AFTER USE  Rest and breathe slowly and easily.  It can be helpful to keep track of a log of your progress. Your caregiver  can provide you with a simple table to help with this. If you are using the spirometer at home, follow these instructions: [place] IF:   You are having difficultly using the spirometer.  You have trouble using the spirometer as often as instructed.  Your pain medication is not giving enough relief while using the spirometer.  You develop fever of 100.5 F (38.1 C) or higher. SEEK IMMEDIATE MEDICAL CARE IF:   You cough up bloody sputum that had not been present before.  You develop fever of 102 F (38.9 C) or greater.  You develop worsening pain at or near the incision site. MAKE SURE YOU:   Understand these instructions.  Will watch your condition.  Will get help right away if you are not doing well or get worse. Document Released: 04/12/2007 Document Revised: 02/22/2012 Document Reviewed: 06/13/2007 Exit[employer] Patient Information 2014 [employer], [place].   ________________________________________________________________________  WHAT IS A BLOOD TRANSFUSION? Blood Transfusion Information  A transfusion is the replacement of blood or some of its parts. Blood is made up of multiple cells which provide different functions.  Red blood cells carry oxygen and are used for blood loss replacement.  White blood cells fight against infection.  Platelets control bleeding.  Plasma helps clot blood.  Other blood products are available for specialized needs, such as hemophilia or other clotting disorders. BEFORE THE TRANSFUSION  Who gives blood for transfusions?   Healthy volunteers who are fully evaluated to make sure their blood is safe. This is blood bank blood. Transfusion therapy is the safest it has ever been in the practice of medicine. Before blood is taken from a donor, a complete history is taken to make sure that person has no history of diseases nor engages in risky social behavior (examples are intravenous drug use or sexual activity with multiple partners). The  donor's travel history is screened to minimize risk of transmitting infections, such as malaria. The donated blood is tested for signs of infectious diseases, such as HIV and hepatitis. The blood is then tested to be sure it is compatible with you in order to minimize the chance of a transfusion reaction. If you or a relative donates blood, this is often done in anticipation of surgery and is not appropriate for emergency situations. It takes many days to process the donated blood. RISKS AND COMPLICATIONS Although transfusion therapy is very safe and saves many lives, the main dangers of transfusion include:   Getting an infectious disease.  Developing a transfusion reaction. This is an allergic reaction to something in the blood you were given. Every precaution is taken to prevent this. The decision to have a blood transfusion has been considered carefully by your caregiver before blood is given. Blood is not given unless the benefits outweigh the risks. AFTER THE TRANSFUSION  Right after receiving a blood transfusion, you will usually feel much better and more energetic. This is especially true if your red blood cells have gotten low (anemic). The transfusion raises the level of the red blood cells which carry oxygen, and this usually causes an energy increase.  The nurse administering the transfusion will  monitor you carefully for complications. HOME CARE INSTRUCTIONS  No special instructions are needed after a transfusion. You may find your energy is better. Speak with your caregiver about any limitations on activity for underlying diseases you may have. SEEK MEDICAL CARE IF:   Your condition is not improving after your transfusion.  You develop redness or irritation at the intravenous (IV) site. SEEK IMMEDIATE MEDICAL CARE IF:  Any of the following symptoms occur over the next 12 hours:  Shaking chills.  You have a temperature by mouth above 102 F (38.9 C), not controlled by  medicine.  Chest, back, or muscle pain.  People around you feel you are not acting correctly or are confused.  Shortness of breath or difficulty breathing.  Dizziness and fainting.  You get a rash or develop hives.  You have a decrease in urine output.  Your urine turns a dark color or changes to pink, red, or brown. Any of the following symptoms occur over the next 10 days:  You have a temperature by mouth above 102 F (38.9 C), not controlled by medicine.  Shortness of breath.  Weakness after normal activity.  The white part of the eye turns yellow (jaundice).  You have a decrease in the amount of urine or are urinating less often.  Your urine turns a dark color or changes to pink, red, or brown. Document Released: 11/27/2000 Document Revised: 02/22/2012 Document Reviewed: 07/16/2008 [facility] Patient Information 2014 [place], [place].  _______________________________________________________________________

## 2017-12-17 ENCOUNTER — Encounter (HOSPITAL_COMMUNITY)
Admission: RE | Admit: 2017-12-17 | Discharge: 2017-12-17 | Disposition: A | Discharge status: Home / Self Care | Payer: Worker's Compensation | Source: Ambulatory Visit | Attending: Orthopedic Surgery | Admitting: Orthopedic Surgery

## 2017-12-17 ENCOUNTER — Encounter (INDEPENDENT_AMBULATORY_CARE_PROVIDER_SITE_OTHER): Payer: Self-pay

## 2017-12-17 ENCOUNTER — Encounter (HOSPITAL_COMMUNITY): Payer: Self-pay

## 2017-12-17 ENCOUNTER — Other Ambulatory Visit: Payer: Self-pay

## 2017-12-17 DIAGNOSIS — Z01818 Encounter for other preprocedural examination: Secondary | ICD-10-CM | POA: Insufficient documentation

## 2017-12-17 DIAGNOSIS — M1711 Unilateral primary osteoarthritis, right knee: Secondary | ICD-10-CM | POA: Insufficient documentation

## 2017-12-17 LAB — BASIC METABOLIC PANEL
Anion gap: 6 (ref 5–15)
BUN: 16 mg/dL (ref 6–20)
CO2: 25 mmol/L (ref 22–32)
Calcium: 9.8 mg/dL (ref 8.9–10.3)
Chloride: 109 mmol/L (ref 101–111)
Creatinine, Ser: 0.85 mg/dL (ref 0.44–1.00)
GFR calc Af Amer: >60 mL/min (ref >60)
GFR calc non Af Amer: >60 mL/min (ref >60)
Glucose, Bld: 101 mg/dL — ABNORMAL HIGH (ref 65–99)
Potassium: 4.3 mmol/L (ref 3.5–5.1)
Sodium: 140 mmol/L (ref 135–145)

## 2017-12-17 LAB — CBC
HCT: 41.3 % (ref 36.0–46.0)
Hemoglobin: 14 g/dL (ref 12.0–15.0)
MCH: 29.4 pg (ref 26.0–34.0)
MCHC: 33.9 g/dL (ref 30.0–36.0)
MCV: 86.6 fL (ref 78.0–100.0)
Platelets: 306 10*3/uL (ref 150–400)
RBC: 4.77 MIL/uL (ref 3.87–5.11)
RDW: 13.9 % (ref 11.5–15.5)
WBC: 8 10*3/uL (ref 4.0–10.5)

## 2017-12-17 LAB — SURGICAL PCR SCREEN
MRSA, PCR: NEGATIVE
Staphylococcus aureus: POSITIVE — AB

## 2017-12-17 LAB — ABO/RH: ABO/RH(D): B POS

## 2017-12-20 ENCOUNTER — Ambulatory Visit: Payer: Self-pay | Admitting: Orthopedic Surgery

## 2017-12-20 NOTE — H&P (Signed)
TOTAL KNEE ADMISSION H&P  Patient is being admitted for right total knee arthroplasty.  Subjective:  Chief Complaint:right knee pain.  HPI: Amber Meyer, 62 y.o. female, has a history of pain and functional disability in the right knee due to arthritis and has failed non-surgical conservative treatments for greater than 12 weeks to includeNSAID's and/or analgesics, corticosteriod injections, viscosupplementation injections, flexibility and strengthening excercises, use of assistive devices, weight reduction as appropriate and activity modification.  Onset of symptoms was abrupt, starting 1 years ago with rapidlly worsening course since that time. The patient noted no past surgery on the bilaterally knee(s).  Patient currently rates pain in the right knee(s) at 10 out of 10 with activity. Patient has night pain, worsening of pain with activity and weight bearing, pain that interferes with activities of daily living, pain with passive range of motion, crepitus and joint swelling.  Patient has evidence of subchondral cysts, subchondral sclerosis, periarticular osteophytes and joint space narrowing by imaging studies.   There are no active problems to display for this patient.  Past Medical History:  Diagnosis Date  . GERD (gastroesophageal reflux disease)   . Hypertension   . Renal disorder    3 kidney stones  . Sinus bradycardia seen on cardiac monitor   . Urinary frequency     Past Surgical History:  Procedure Laterality Date  . ABDOMINAL HYSTERECTOMY  1981   partial  . Kidney stone removal Left 1999  . KNEE SURGERY Right 2013   cleaned out damaged tissue and arthritis  . LITHOTRIPSY  2003, 2006   x 2    Current Outpatient Medications  Medication Sig Dispense Refill Last Dose  . albuterol (PROVENTIL HFA;VENTOLIN HFA) 108 (90 BASE) MCG/ACT inhaler Inhale 2 puffs into the lungs every 4 (four) hours as needed for wheezing. 1 Inhaler 12 Past Month at Unknown time  .  Amlodipine-Valsartan-HCTZ 5-160-25 MG TABS Take 1 tablet by mouth daily.  5 01/16/2015 at Unknown time  . aspirin EC 81 MG tablet Take 81 mg by mouth at bedtime.    10/03/2016 at Unknown time  . chlorpheniramine-HYDROcodone (TUSSIONEX PENNKINETIC ER) 10-8 MG/5ML LQCR Take 5 mLs by mouth every 12 (twelve) hours as needed. (Patient not taking: Reported on 01/16/2015) 60 mL 0 Completed Course at Unknown time  . clonazePAM (KLONOPIN) 0.5 MG tablet Take 1 tablet (0.5 mg total) by mouth 2 (two) times daily as needed for anxiety. (Patient not taking: Reported on 12/08/2017) 15 tablet 0 Not Taking at Unknown time  . Dexlansoprazole (DEXILANT) 30 MG capsule Take 30 mg by mouth daily.    10/03/2016 at Unknown time  . diclofenac (VOLTAREN) 75 MG EC tablet Take 1 tablet (75 mg total) by mouth 2 (two) times daily. (Patient not taking: Reported on 12/08/2017) 14 tablet 0 Completed Course at Unknown time  . ibuprofen (ADVIL,MOTRIN) 400 MG tablet Take 1 tablet (400 mg total) by mouth every 6 (six) hours as needed for mild pain. (Patient not taking: Reported on 12/17/2017) 30 tablet 0 Not Taking at Unknown time  . Menthol-Methyl Salicylate (THERA-GESIC EX) Apply 1 application topically as needed (for pain).     . methocarbamol (ROBAXIN) 500 MG tablet Take 1 tablet (500 mg total) by mouth 2 (two) times daily. (Patient not taking: Reported on 12/08/2017) 20 tablet 0 Completed Course at Unknown time  . metoprolol (LOPRESSOR) 100 MG tablet Take 100 mg by mouth at bedtime.    10/03/2016 at Unknown time  . Multiple Vitamins-Calcium (ONE-A-DAY WOMENS PO)  Take 1 tablet by mouth daily.     Marland Kitchen MYRBETRIQ 50 MG TB24 tablet Take 50 mg by mouth daily.    10/03/2016 at Unknown time  . traMADol (ULTRAM) 50 MG tablet Take 1 tablet (50 mg total) by mouth every 6 (six) hours as needed. (Patient not taking: Reported on 12/08/2017) 15 tablet 0 Completed Course at Unknown time   No current facility-administered medications for this visit.     Allergies  Allergen Reactions  . Hydrocodone-Acetaminophen Hives and Other (See Comments)    Can take with Benadryl     Social History   Tobacco Use  . Smoking status: Current Every Day Smoker    Packs/day: 0.50    Types: Cigarettes  . Smokeless tobacco: Never Used  Substance Use Topics  . Alcohol use: No    Family History  Problem Relation Age of Onset  . Aneurysm Mother   . Alzheimer's disease Father      Review of Systems  Constitutional: Negative.   HENT: Negative.   Eyes: Negative.   Respiratory: Negative.   Cardiovascular: Negative.   Gastrointestinal: Positive for heartburn.  Genitourinary: Negative.   Musculoskeletal: Positive for back pain and joint pain.  Skin: Negative.   Neurological: Negative.   Endo/Heme/Allergies: Negative.   Psychiatric/Behavioral: Negative.     Objective:  Physical Exam  Vitals reviewed. Constitutional: She is oriented to person, place, and time. She appears well-developed and well-nourished.  HENT:  Head: Normocephalic and atraumatic.  Eyes: Conjunctivae are normal. Pupils are equal, round, and reactive to light.  Neck: Normal range of motion. Neck supple.  Cardiovascular: Normal rate.  Respiratory: Effort normal. No respiratory distress.  GI: Soft. She exhibits no distension.  Genitourinary:  Genitourinary Comments: deferred  Musculoskeletal:       Right knee: She exhibits decreased range of motion, swelling, effusion and deformity. Tenderness found. Medial joint line tenderness noted.  Neurological: She is alert and oriented to person, place, and time. She has normal reflexes.  Skin: Skin is warm and dry.  Psychiatric: She has a normal mood and affect. Her behavior is normal. Judgment and thought content normal.    Vital signs in last 24 hours: @VSRANGES @  Labs:   Estimated body mass index is 32.73 kg/m as calculated from the following:   Height as of 12/17/17: 5\' 7"  (1.702 m).   Weight as of 12/17/17: 94.8 kg (209  lb).   Imaging Review Plain radiographs demonstrate severe degenerative joint disease of the right knee(s). The overall alignment issignificant varus. The bone quality appears to be adequate for age and reported activity level.  Assessment/Plan:  End stage arthritis, right knee   The patient history, physical examination, clinical judgment of the provider and imaging studies are consistent with end stage degenerative joint disease of the right knee(s) and total knee arthroplasty is deemed medically necessary. The treatment options including medical management, injection therapy arthroscopy and arthroplasty were discussed at length. The risks and benefits of total knee arthroplasty were presented and reviewed. The risks due to aseptic loosening, infection, stiffness, patella tracking problems, thromboembolic complications and other imponderables were discussed. The patient acknowledged the explanation, agreed to proceed with the plan and consent was signed. Patient is being admitted for inpatient treatment for surgery, pain control, PT, OT, prophylactic antibiotics, VTE prophylaxis, progressive ambulation and ADL's and discharge planning. The patient is planning to be discharged home with HEP. Apixaban for h/o DVT. Has DME and outpatient PT scheduled.

## 2017-12-20 NOTE — H&P (View-Only) (Signed)
TOTAL KNEE ADMISSION H&P  Patient is being admitted for right total knee arthroplasty.  Subjective:  Chief Complaint:right knee pain.  HPI: Amber Meyer, 61 y.o. female, has a history of pain and functional disability in the right knee due to arthritis and has failed non-surgical conservative treatments for greater than 12 weeks to includeNSAID's and/or analgesics, corticosteriod injections, viscosupplementation injections, flexibility and strengthening excercises, use of assistive devices, weight reduction as appropriate and activity modification.  Onset of symptoms was abrupt, starting 1 years ago with rapidlly worsening course since that time. The patient noted no past surgery on the bilaterally knee(s).  Patient currently rates pain in the right knee(s) at 10 out of 10 with activity. Patient has night pain, worsening of pain with activity and weight bearing, pain that interferes with activities of daily living, pain with passive range of motion, crepitus and joint swelling.  Patient has evidence of subchondral cysts, subchondral sclerosis, periarticular osteophytes and joint space narrowing by imaging studies.   There are no active problems to display for this patient.  Past Medical History:  Diagnosis Date  . GERD (gastroesophageal reflux disease)   . Hypertension   . Renal disorder    3 kidney stones  . Sinus bradycardia seen on cardiac monitor   . Urinary frequency     Past Surgical History:  Procedure Laterality Date  . ABDOMINAL HYSTERECTOMY  1981   partial  . Kidney stone removal Left 1999  . KNEE SURGERY Right 2013   cleaned out damaged tissue and arthritis  . LITHOTRIPSY  2003, 2006   x 2    Current Outpatient Medications  Medication Sig Dispense Refill Last Dose  . albuterol (PROVENTIL HFA;VENTOLIN HFA) 108 (90 BASE) MCG/ACT inhaler Inhale 2 puffs into the lungs every 4 (four) hours as needed for wheezing. 1 Inhaler 12 Past Month at Unknown time  .  Amlodipine-Valsartan-HCTZ 5-160-25 MG TABS Take 1 tablet by mouth daily.  5 01/16/2015 at Unknown time  . aspirin EC 81 MG tablet Take 81 mg by mouth at bedtime.    10/03/2016 at Unknown time  . chlorpheniramine-HYDROcodone (TUSSIONEX PENNKINETIC ER) 10-8 MG/5ML LQCR Take 5 mLs by mouth every 12 (twelve) hours as needed. (Patient not taking: Reported on 01/16/2015) 60 mL 0 Completed Course at Unknown time  . clonazePAM (KLONOPIN) 0.5 MG tablet Take 1 tablet (0.5 mg total) by mouth 2 (two) times daily as needed for anxiety. (Patient not taking: Reported on 12/08/2017) 15 tablet 0 Not Taking at Unknown time  . Dexlansoprazole (DEXILANT) 30 MG capsule Take 30 mg by mouth daily.    10/03/2016 at Unknown time  . diclofenac (VOLTAREN) 75 MG EC tablet Take 1 tablet (75 mg total) by mouth 2 (two) times daily. (Patient not taking: Reported on 12/08/2017) 14 tablet 0 Completed Course at Unknown time  . ibuprofen (ADVIL,MOTRIN) 400 MG tablet Take 1 tablet (400 mg total) by mouth every 6 (six) hours as needed for mild pain. (Patient not taking: Reported on 12/17/2017) 30 tablet 0 Not Taking at Unknown time  . Menthol-Methyl Salicylate (THERA-GESIC EX) Apply 1 application topically as needed (for pain).     . methocarbamol (ROBAXIN) 500 MG tablet Take 1 tablet (500 mg total) by mouth 2 (two) times daily. (Patient not taking: Reported on 12/08/2017) 20 tablet 0 Completed Course at Unknown time  . metoprolol (LOPRESSOR) 100 MG tablet Take 100 mg by mouth at bedtime.    10/03/2016 at Unknown time  . Multiple Vitamins-Calcium (ONE-A-DAY WOMENS PO)  Take 1 tablet by mouth daily.     Marland Kitchen MYRBETRIQ 50 MG TB24 tablet Take 50 mg by mouth daily.    10/03/2016 at Unknown time  . traMADol (ULTRAM) 50 MG tablet Take 1 tablet (50 mg total) by mouth every 6 (six) hours as needed. (Patient not taking: Reported on 12/08/2017) 15 tablet 0 Completed Course at Unknown time   No current facility-administered medications for this visit.     Allergies  Allergen Reactions  . Hydrocodone-Acetaminophen Hives and Other (See Comments)    Can take with Benadryl     Social History   Tobacco Use  . Smoking status: Current Every Day Smoker    Packs/day: 0.50    Types: Cigarettes  . Smokeless tobacco: Never Used  Substance Use Topics  . Alcohol use: No    Family History  Problem Relation Age of Onset  . Aneurysm Mother   . Alzheimer's disease Father      Review of Systems  Constitutional: Negative.   HENT: Negative.   Eyes: Negative.   Respiratory: Negative.   Cardiovascular: Negative.   Gastrointestinal: Positive for heartburn.  Genitourinary: Negative.   Musculoskeletal: Positive for back pain and joint pain.  Skin: Negative.   Neurological: Negative.   Endo/Heme/Allergies: Negative.   Psychiatric/Behavioral: Negative.     Objective:  Physical Exam  Vitals reviewed. Constitutional: She is oriented to person, place, and time. She appears well-developed and well-nourished.  HENT:  Head: Normocephalic and atraumatic.  Eyes: Conjunctivae are normal. Pupils are equal, round, and reactive to light.  Neck: Normal range of motion. Neck supple.  Cardiovascular: Normal rate.  Respiratory: Effort normal. No respiratory distress.  GI: Soft. She exhibits no distension.  Genitourinary:  Genitourinary Comments: deferred  Musculoskeletal:       Right knee: She exhibits decreased range of motion, swelling, effusion and deformity. Tenderness found. Medial joint line tenderness noted.  Neurological: She is alert and oriented to person, place, and time. She has normal reflexes.  Skin: Skin is warm and dry.  Psychiatric: She has a normal mood and affect. Her behavior is normal. Judgment and thought content normal.    Vital signs in last 24 hours: @VSRANGES @  Labs:   Estimated body mass index is 32.73 kg/m as calculated from the following:   Height as of 12/17/17: 5\' 7"  (1.702 m).   Weight as of 12/17/17: 94.8 kg (209  lb).   Imaging Review Plain radiographs demonstrate severe degenerative joint disease of the right knee(s). The overall alignment issignificant varus. The bone quality appears to be adequate for age and reported activity level.  Assessment/Plan:  End stage arthritis, right knee   The patient history, physical examination, clinical judgment of the provider and imaging studies are consistent with end stage degenerative joint disease of the right knee(s) and total knee arthroplasty is deemed medically necessary. The treatment options including medical management, injection therapy arthroscopy and arthroplasty were discussed at length. The risks and benefits of total knee arthroplasty were presented and reviewed. The risks due to aseptic loosening, infection, stiffness, patella tracking problems, thromboembolic complications and other imponderables were discussed. The patient acknowledged the explanation, agreed to proceed with the plan and consent was signed. Patient is being admitted for inpatient treatment for surgery, pain control, PT, OT, prophylactic antibiotics, VTE prophylaxis, progressive ambulation and ADL's and discharge planning. The patient is planning to be discharged home with HEP. Apixaban for h/o DVT. Has DME and outpatient PT scheduled.

## 2017-12-20 NOTE — Progress Notes (Signed)
12-17-17 PCR result routed to Dr. Lyla Glassing for review.

## 2017-12-22 MED ORDER — TRANEXAMIC ACID 1000 MG/10ML IV SOLN
1000.0000 mg | INTRAVENOUS | Status: AC
  Administered 2017-12-23: 1000 mg via INTRAVENOUS
  Filled 2017-12-22: qty 1100

## 2017-12-23 ENCOUNTER — Inpatient Hospital Stay (HOSPITAL_COMMUNITY): Payer: Worker's Compensation | Admitting: Certified Registered Nurse Anesthetist

## 2017-12-23 ENCOUNTER — Encounter (HOSPITAL_COMMUNITY): Payer: Self-pay | Admitting: *Deleted

## 2017-12-23 ENCOUNTER — Encounter (HOSPITAL_COMMUNITY)
Admission: RE | Disposition: A | Discharge status: Home / Self Care | Payer: Self-pay | Source: Ambulatory Visit | Attending: Orthopedic Surgery

## 2017-12-23 ENCOUNTER — Other Ambulatory Visit: Payer: Self-pay

## 2017-12-23 ENCOUNTER — Inpatient Hospital Stay (HOSPITAL_COMMUNITY): Payer: Worker's Compensation

## 2017-12-23 ENCOUNTER — Inpatient Hospital Stay (HOSPITAL_COMMUNITY)
Admission: RE | Admit: 2017-12-23 | Discharge: 2017-12-25 | DRG: 470 | Disposition: A | Discharge status: Home / Self Care | Payer: Worker's Compensation | Source: Ambulatory Visit | Attending: Orthopedic Surgery | Admitting: Orthopedic Surgery

## 2017-12-23 DIAGNOSIS — M1711 Unilateral primary osteoarthritis, right knee: Secondary | ICD-10-CM | POA: Diagnosis present

## 2017-12-23 DIAGNOSIS — F17210 Nicotine dependence, cigarettes, uncomplicated: Secondary | ICD-10-CM | POA: Diagnosis present

## 2017-12-23 DIAGNOSIS — Z87442 Personal history of urinary calculi: Secondary | ICD-10-CM

## 2017-12-23 DIAGNOSIS — I10 Essential (primary) hypertension: Secondary | ICD-10-CM | POA: Diagnosis present

## 2017-12-23 DIAGNOSIS — Z90710 Acquired absence of both cervix and uterus: Secondary | ICD-10-CM | POA: Diagnosis not present

## 2017-12-23 DIAGNOSIS — Z7982 Long term (current) use of aspirin: Secondary | ICD-10-CM | POA: Diagnosis not present

## 2017-12-23 DIAGNOSIS — Z820 Family history of epilepsy and other diseases of the nervous system: Secondary | ICD-10-CM | POA: Diagnosis not present

## 2017-12-23 DIAGNOSIS — Z885 Allergy status to narcotic agent status: Secondary | ICD-10-CM | POA: Diagnosis not present

## 2017-12-23 DIAGNOSIS — K219 Gastro-esophageal reflux disease without esophagitis: Secondary | ICD-10-CM | POA: Diagnosis present

## 2017-12-23 DIAGNOSIS — M25561 Pain in right knee: Secondary | ICD-10-CM | POA: Diagnosis present

## 2017-12-23 DIAGNOSIS — Z96651 Presence of right artificial knee joint: Secondary | ICD-10-CM

## 2017-12-23 HISTORY — PX: KNEE ARTHROPLASTY: SHX992

## 2017-12-23 LAB — TYPE AND SCREEN
ABO/RH(D): B POS
Antibody Screen: NEGATIVE

## 2017-12-23 SURGERY — ARTHROPLASTY, KNEE, TOTAL, USING IMAGELESS COMPUTER-ASSISTED NAVIGATION
Anesthesia: Spinal | Site: Knee | Laterality: Right

## 2017-12-23 MED ORDER — PHENOL 1.4 % MT LIQD: 1.0000 | OROMUCOSAL | Status: DC | PRN

## 2017-12-23 MED ORDER — LACTATED RINGERS IV SOLN
INTRAVENOUS | Status: DC
  Administered 2017-12-23 (×3): via INTRAVENOUS

## 2017-12-23 MED ORDER — KETOROLAC TROMETHAMINE 30 MG/ML IJ SOLN
INTRAMUSCULAR | Status: DC | PRN
  Administered 2017-12-23: 30 mg via INTRAVENOUS

## 2017-12-23 MED ORDER — PROPOFOL 10 MG/ML IV BOLUS
INTRAVENOUS | Status: AC
  Filled 2017-12-23: qty 20

## 2017-12-23 MED ORDER — SENNA 8.6 MG PO TABS
2.0000 | ORAL_TABLET | Freq: Every day | ORAL | Status: DC
  Administered 2017-12-23 – 2017-12-24 (×2): 17.2 mg via ORAL
  Filled 2017-12-23 (×2): qty 2

## 2017-12-23 MED ORDER — SODIUM CHLORIDE 0.9 % IJ SOLN
INTRAMUSCULAR | Status: AC
  Filled 2017-12-23: qty 50

## 2017-12-23 MED ORDER — DOCUSATE SODIUM 100 MG PO CAPS
100.0000 mg | ORAL_CAPSULE | Freq: Two times a day (BID) | ORAL | Status: DC
  Administered 2017-12-23 – 2017-12-25 (×4): 100 mg via ORAL
  Filled 2017-12-23 (×4): qty 1

## 2017-12-23 MED ORDER — MIRABEGRON ER 25 MG PO TB24
50.0000 mg | ORAL_TABLET | Freq: Every day | ORAL | Status: DC
  Administered 2017-12-24 – 2017-12-25 (×2): 50 mg via ORAL
  Filled 2017-12-23 (×3): qty 2

## 2017-12-23 MED ORDER — HYDROMORPHONE HCL 1 MG/ML IJ SOLN
0.5000 mg | INTRAMUSCULAR | Status: DC | PRN
  Administered 2017-12-23: 0.5 mg via INTRAVENOUS
  Administered 2017-12-24: 1 mg via INTRAVENOUS
  Administered 2017-12-24: 01:00:00 0.5 mg via INTRAVENOUS
  Filled 2017-12-23 (×3): qty 1

## 2017-12-23 MED ORDER — PROPOFOL 500 MG/50ML IV EMUL
INTRAVENOUS | Status: DC | PRN
  Administered 2017-12-23: 90 ug/kg/min via INTRAVENOUS

## 2017-12-23 MED ORDER — PROPOFOL 10 MG/ML IV BOLUS
INTRAVENOUS | Status: DC | PRN
  Administered 2017-12-23 (×2): 20 mg via INTRAVENOUS

## 2017-12-23 MED ORDER — POVIDONE-IODINE 10 % EX SWAB: 2.0000 "application " | Freq: Once | CUTANEOUS | Status: DC

## 2017-12-23 MED ORDER — METOCLOPRAMIDE HCL 5 MG PO TABS: 5.0000 mg | ORAL_TABLET | Freq: Three times a day (TID) | ORAL | Status: DC | PRN

## 2017-12-23 MED ORDER — BUPIVACAINE-EPINEPHRINE 0.25% -1:200000 IJ SOLN
INTRAMUSCULAR | Status: DC | PRN
  Administered 2017-12-23: 30 mL

## 2017-12-23 MED ORDER — ACETAMINOPHEN 650 MG RE SUPP: 650.0000 mg | RECTAL | Status: DC | PRN

## 2017-12-23 MED ORDER — MENTHOL 3 MG MT LOZG: 1.0000 | LOZENGE | OROMUCOSAL | Status: DC | PRN

## 2017-12-23 MED ORDER — POLYETHYLENE GLYCOL 3350 17 G PO PACK: 17.0000 g | PACK | Freq: Every day | ORAL | Status: DC | PRN

## 2017-12-23 MED ORDER — PROPOFOL 10 MG/ML IV BOLUS
INTRAVENOUS | Status: AC
  Filled 2017-12-23: qty 40

## 2017-12-23 MED ORDER — CHLORHEXIDINE GLUCONATE 4 % EX LIQD: 60.0000 mL | Freq: Once | CUTANEOUS | Status: DC

## 2017-12-23 MED ORDER — METHOCARBAMOL 1000 MG/10ML IJ SOLN
500.0000 mg | Freq: Four times a day (QID) | INTRAMUSCULAR | Status: DC | PRN
  Filled 2017-12-23: qty 5

## 2017-12-23 MED ORDER — EPHEDRINE SULFATE 50 MG/ML IJ SOLN
INTRAMUSCULAR | Status: DC | PRN
  Administered 2017-12-23: 5 mg via INTRAVENOUS

## 2017-12-23 MED ORDER — AMLODIPINE-VALSARTAN-HCTZ 5-160-25 MG PO TABS: 1.0000 | ORAL_TABLET | Freq: Every day | ORAL | Status: DC

## 2017-12-23 MED ORDER — METHOCARBAMOL 500 MG PO TABS
500.0000 mg | ORAL_TABLET | Freq: Four times a day (QID) | ORAL | Status: DC | PRN
  Administered 2017-12-23 – 2017-12-25 (×6): 500 mg via ORAL
  Filled 2017-12-23 (×6): qty 1

## 2017-12-23 MED ORDER — PANTOPRAZOLE SODIUM 40 MG PO TBEC
40.0000 mg | DELAYED_RELEASE_TABLET | Freq: Every day | ORAL | Status: DC
  Administered 2017-12-24 – 2017-12-25 (×2): 40 mg via ORAL
  Filled 2017-12-23 (×2): qty 1

## 2017-12-23 MED ORDER — DEXAMETHASONE SODIUM PHOSPHATE 10 MG/ML IJ SOLN
INTRAMUSCULAR | Status: DC | PRN
  Administered 2017-12-23: 10 mg via INTRAVENOUS

## 2017-12-23 MED ORDER — PROMETHAZINE HCL 25 MG/ML IJ SOLN: 6.2500 mg | INTRAMUSCULAR | Status: DC | PRN

## 2017-12-23 MED ORDER — STERILE WATER FOR IRRIGATION IR SOLN
Status: DC | PRN
  Administered 2017-12-23: 2000 mL

## 2017-12-23 MED ORDER — ONDANSETRON HCL 4 MG/2ML IJ SOLN
INTRAMUSCULAR | Status: AC
  Filled 2017-12-23: qty 2

## 2017-12-23 MED ORDER — ISOPROPYL ALCOHOL 70 % SOLN
Status: AC
  Filled 2017-12-23: qty 480

## 2017-12-23 MED ORDER — KETOROLAC TROMETHAMINE 30 MG/ML IJ SOLN
INTRAMUSCULAR | Status: AC
  Filled 2017-12-23: qty 1

## 2017-12-23 MED ORDER — HYDROMORPHONE HCL 1 MG/ML IJ SOLN: 0.2500 mg | INTRAMUSCULAR | Status: DC | PRN

## 2017-12-23 MED ORDER — ONDANSETRON HCL 4 MG PO TABS
4.0000 mg | ORAL_TABLET | Freq: Four times a day (QID) | ORAL | Status: DC | PRN
  Administered 2017-12-24: 4 mg via ORAL
  Filled 2017-12-23: qty 1

## 2017-12-23 MED ORDER — CEFAZOLIN SODIUM-DEXTROSE 2-4 GM/100ML-% IV SOLN
2.0000 g | INTRAVENOUS | Status: AC
  Administered 2017-12-23: 2 g via INTRAVENOUS
  Filled 2017-12-23: qty 100

## 2017-12-23 MED ORDER — DEXAMETHASONE SODIUM PHOSPHATE 10 MG/ML IJ SOLN
10.0000 mg | Freq: Once | INTRAMUSCULAR | Status: AC
  Administered 2017-12-24: 10:00:00 10 mg via INTRAVENOUS
  Filled 2017-12-23: qty 1

## 2017-12-23 MED ORDER — AMLODIPINE BESYLATE 5 MG PO TABS
5.0000 mg | ORAL_TABLET | Freq: Every day | ORAL | Status: DC
  Administered 2017-12-23 – 2017-12-25 (×3): 5 mg via ORAL
  Filled 2017-12-23 (×3): qty 1

## 2017-12-23 MED ORDER — ALUM & MAG HYDROXIDE-SIMETH 200-200-20 MG/5ML PO SUSP
30.0000 mL | ORAL | Status: DC | PRN
  Administered 2017-12-25: 04:00:00 30 mL via ORAL
  Filled 2017-12-23: qty 30

## 2017-12-23 MED ORDER — APIXABAN 2.5 MG PO TABS
2.5000 mg | ORAL_TABLET | Freq: Two times a day (BID) | ORAL | Status: DC
  Administered 2017-12-24 – 2017-12-25 (×3): 2.5 mg via ORAL
  Filled 2017-12-23 (×3): qty 1

## 2017-12-23 MED ORDER — SODIUM CHLORIDE 0.9 % IV SOLN: INTRAVENOUS | Status: DC

## 2017-12-23 MED ORDER — PHENYLEPHRINE HCL 10 MG/ML IJ SOLN
INTRAMUSCULAR | Status: DC | PRN
  Administered 2017-12-23 (×5): 80 ug via INTRAVENOUS

## 2017-12-23 MED ORDER — ALBUTEROL SULFATE (2.5 MG/3ML) 0.083% IN NEBU: 2.5000 mg | INHALATION_SOLUTION | RESPIRATORY_TRACT | Status: DC | PRN

## 2017-12-23 MED ORDER — ONDANSETRON HCL 4 MG/2ML IJ SOLN: 4.0000 mg | Freq: Four times a day (QID) | INTRAMUSCULAR | Status: DC | PRN

## 2017-12-23 MED ORDER — SODIUM CHLORIDE 0.9 % IR SOLN
Status: DC | PRN
  Administered 2017-12-23: 1000 mL

## 2017-12-23 MED ORDER — SODIUM CHLORIDE 0.9 % IJ SOLN
INTRAMUSCULAR | Status: DC | PRN
  Administered 2017-12-23: 30 mL via INTRAVENOUS

## 2017-12-23 MED ORDER — KETOROLAC TROMETHAMINE 15 MG/ML IJ SOLN
15.0000 mg | Freq: Four times a day (QID) | INTRAMUSCULAR | Status: AC
  Administered 2017-12-23 – 2017-12-24 (×4): 15 mg via INTRAVENOUS
  Filled 2017-12-23 (×4): qty 1

## 2017-12-23 MED ORDER — BUPIVACAINE-EPINEPHRINE 0.25% -1:200000 IJ SOLN
INTRAMUSCULAR | Status: AC
  Filled 2017-12-23: qty 1

## 2017-12-23 MED ORDER — FENTANYL CITRATE (PF) 100 MCG/2ML IJ SOLN
50.0000 ug | INTRAMUSCULAR | Status: DC
  Administered 2017-12-23 (×2): 50 ug via INTRAVENOUS
  Filled 2017-12-23: qty 2

## 2017-12-23 MED ORDER — METOPROLOL TARTRATE 50 MG PO TABS
100.0000 mg | ORAL_TABLET | Freq: Every day | ORAL | Status: DC
  Administered 2017-12-23 – 2017-12-24 (×2): 100 mg via ORAL
  Filled 2017-12-23 (×2): qty 2

## 2017-12-23 MED ORDER — HYDROCHLOROTHIAZIDE 25 MG PO TABS
25.0000 mg | ORAL_TABLET | Freq: Every day | ORAL | Status: DC
  Administered 2017-12-23 – 2017-12-25 (×3): 25 mg via ORAL
  Filled 2017-12-23 (×3): qty 1

## 2017-12-23 MED ORDER — SODIUM CHLORIDE 0.9 % IR SOLN
Status: DC | PRN
  Administered 2017-12-23: 3000 mL

## 2017-12-23 MED ORDER — BUPIVACAINE IN DEXTROSE 0.75-8.25 % IT SOLN
INTRATHECAL | Status: DC | PRN
  Administered 2017-12-23: 2 mL via INTRATHECAL

## 2017-12-23 MED ORDER — OXYCODONE HCL 5 MG PO TABS
5.0000 mg | ORAL_TABLET | ORAL | Status: DC | PRN
  Administered 2017-12-23 (×2): 5 mg via ORAL
  Filled 2017-12-23 (×2): qty 1

## 2017-12-23 MED ORDER — ONDANSETRON HCL 4 MG/2ML IJ SOLN
INTRAMUSCULAR | Status: DC | PRN
  Administered 2017-12-23: 4 mg via INTRAVENOUS

## 2017-12-23 MED ORDER — DIPHENHYDRAMINE HCL 12.5 MG/5ML PO ELIX
12.5000 mg | ORAL_SOLUTION | ORAL | Status: DC | PRN
  Administered 2017-12-24: 01:00:00 25 mg via ORAL
  Filled 2017-12-23: qty 10

## 2017-12-23 MED ORDER — IRBESARTAN 150 MG PO TABS
150.0000 mg | ORAL_TABLET | Freq: Every day | ORAL | Status: DC
  Administered 2017-12-23 – 2017-12-25 (×3): 150 mg via ORAL
  Filled 2017-12-23 (×3): qty 1

## 2017-12-23 MED ORDER — OXYCODONE HCL 5 MG PO TABS
10.0000 mg | ORAL_TABLET | ORAL | Status: DC | PRN
  Administered 2017-12-23 – 2017-12-25 (×9): 10 mg via ORAL
  Filled 2017-12-23 (×9): qty 2

## 2017-12-23 MED ORDER — METOCLOPRAMIDE HCL 5 MG/ML IJ SOLN
5.0000 mg | Freq: Three times a day (TID) | INTRAMUSCULAR | Status: DC | PRN
  Administered 2017-12-23: 10 mg via INTRAVENOUS
  Filled 2017-12-23: qty 2

## 2017-12-23 MED ORDER — ACETAMINOPHEN 10 MG/ML IV SOLN
1000.0000 mg | INTRAVENOUS | Status: AC
  Administered 2017-12-23: 1000 mg via INTRAVENOUS
  Filled 2017-12-23: qty 100

## 2017-12-23 MED ORDER — MIDAZOLAM HCL 2 MG/2ML IJ SOLN
1.0000 mg | INTRAMUSCULAR | Status: DC
  Administered 2017-12-23 (×2): 1 mg via INTRAVENOUS
  Filled 2017-12-23: qty 2

## 2017-12-23 MED ORDER — SODIUM CHLORIDE 0.9 % IV SOLN
INTRAVENOUS | Status: DC
  Administered 2017-12-23: 16:00:00 150 mL/h via INTRAVENOUS
  Administered 2017-12-24: 01:00:00 via INTRAVENOUS
  Administered 2017-12-24: 12:00:00 150 mL/h via INTRAVENOUS

## 2017-12-23 MED ORDER — ACETAMINOPHEN 325 MG PO TABS: 650.0000 mg | ORAL_TABLET | ORAL | Status: DC | PRN

## 2017-12-23 MED ORDER — CEFAZOLIN SODIUM-DEXTROSE 2-4 GM/100ML-% IV SOLN
2.0000 g | Freq: Four times a day (QID) | INTRAVENOUS | Status: AC
  Administered 2017-12-23 – 2017-12-24 (×2): 2 g via INTRAVENOUS
  Filled 2017-12-23 (×2): qty 100

## 2017-12-23 MED ORDER — ROPIVACAINE HCL 7.5 MG/ML IJ SOLN
INTRAMUSCULAR | Status: DC | PRN
  Administered 2017-12-23: 20 mL via PERINEURAL

## 2017-12-23 MED ORDER — ISOPROPYL ALCOHOL 70 % SOLN
Status: DC | PRN
  Administered 2017-12-23: 1 via TOPICAL

## 2017-12-23 SURGICAL SUPPLY — 58 items
BAG ZIPLOCK 12X15 (MISCELLANEOUS) IMPLANT
BANDAGE ACE 4X5 VEL STRL LF (GAUZE/BANDAGES/DRESSINGS) ×3 IMPLANT
BANDAGE ACE 6X5 VEL STRL LF (GAUZE/BANDAGES/DRESSINGS) ×3 IMPLANT
BANDAGE ELASTIC 4 VELCRO ST LF (GAUZE/BANDAGES/DRESSINGS) ×3 IMPLANT
BLADE SAW RECIPROCATING 77.5 (BLADE) ×3 IMPLANT
BNDG ELASTIC 6X10 VLCR STRL LF (GAUZE/BANDAGES/DRESSINGS) ×3 IMPLANT
CAPT KNEE TRIATH TK-4 ×3 IMPLANT
CHLORAPREP W/TINT 26ML (MISCELLANEOUS) ×6 IMPLANT
COVER SURGICAL LIGHT HANDLE (MISCELLANEOUS) ×3 IMPLANT
CUFF TOURN SGL QUICK 34 (TOURNIQUET CUFF) ×2
CUFF TRNQT CYL 34X4X40X1 (TOURNIQUET CUFF) ×1 IMPLANT
DECANTER SPIKE VIAL GLASS SM (MISCELLANEOUS) ×6 IMPLANT
DERMABOND ADVANCED (GAUZE/BANDAGES/DRESSINGS) ×2
DERMABOND ADVANCED .7 DNX12 (GAUZE/BANDAGES/DRESSINGS) ×1 IMPLANT
DRAPE SHEET LG 3/4 BI-LAMINATE (DRAPES) ×6 IMPLANT
DRAPE U-SHAPE 47X51 STRL (DRAPES) ×3 IMPLANT
DRSG AQUACEL AG ADV 3.5X10 (GAUZE/BANDAGES/DRESSINGS) ×3 IMPLANT
DRSG TEGADERM 4X4.75 (GAUZE/BANDAGES/DRESSINGS) IMPLANT
ELECT BLADE TIP CTD 4 INCH (ELECTRODE) ×3 IMPLANT
ELECT REM PT RETURN 15FT ADLT (MISCELLANEOUS) ×3 IMPLANT
EVACUATOR 1/8 PVC DRAIN (DRAIN) IMPLANT
GAUZE SPONGE 4X4 12PLY STRL (GAUZE/BANDAGES/DRESSINGS) ×3 IMPLANT
GLOVE BIO SURGEON STRL SZ8.5 (GLOVE) ×6 IMPLANT
GLOVE BIOGEL PI IND STRL 8.5 (GLOVE) ×1 IMPLANT
GLOVE BIOGEL PI INDICATOR 8.5 (GLOVE) ×2
GOWN SPEC L3 XXLG W/TWL (GOWN DISPOSABLE) ×3 IMPLANT
HANDPIECE INTERPULSE COAX TIP (DISPOSABLE) ×2
HOLDER FOLEY CATH W/STRAP (MISCELLANEOUS) ×3 IMPLANT
HOOD PEEL AWAY FLYTE STAYCOOL (MISCELLANEOUS) ×9 IMPLANT
MARKER SKIN DUAL TIP RULER LAB (MISCELLANEOUS) ×3 IMPLANT
NEEDLE SPNL 18GX3.5 QUINCKE PK (NEEDLE) ×3 IMPLANT
NS IRRIG 1000ML POUR BTL (IV SOLUTION) ×3 IMPLANT
PACK TOTAL KNEE CUSTOM (KITS) ×3 IMPLANT
PADDING CAST COTTON 6X4 STRL (CAST SUPPLIES) ×3 IMPLANT
POSITIONER SURGICAL ARM (MISCELLANEOUS) ×3 IMPLANT
SAW OSC TIP CART 19.5X105X1.3 (SAW) ×3 IMPLANT
SEALER BIPOLAR AQUA 6.0 (INSTRUMENTS) ×3 IMPLANT
SET HNDPC FAN SPRY TIP SCT (DISPOSABLE) ×1 IMPLANT
SET PAD KNEE POSITIONER (MISCELLANEOUS) ×3 IMPLANT
SPONGE DRAIN TRACH 4X4 STRL 2S (GAUZE/BANDAGES/DRESSINGS) IMPLANT
SPONGE LAP 18X18 X RAY DECT (DISPOSABLE) IMPLANT
SUCTION FRAZIER HANDLE 12FR (TUBING) ×2
SUCTION TUBE FRAZIER 12FR DISP (TUBING) ×1 IMPLANT
SUT MNCRL AB 3-0 PS2 18 (SUTURE) ×3 IMPLANT
SUT MON AB 2-0 CT1 36 (SUTURE) ×6 IMPLANT
SUT STRATAFIX PDO 1 14 VIOLET (SUTURE) ×2
SUT STRATFX PDO 1 14 VIOLET (SUTURE) ×1
SUT VIC AB 1 CT1 36 (SUTURE) ×9 IMPLANT
SUT VIC AB 2-0 CT1 27 (SUTURE) ×2
SUT VIC AB 2-0 CT1 TAPERPNT 27 (SUTURE) ×1 IMPLANT
SUTURE STRATFX PDO 1 14 VIOLET (SUTURE) ×1 IMPLANT
SYR 50ML LL SCALE MARK (SYRINGE) ×3 IMPLANT
TOWER CARTRIDGE SMART MIX (DISPOSABLE) IMPLANT
TRAY FOLEY CATH 14FRSI W/METER (CATHETERS) ×3 IMPLANT
TRAY FOLEY W/METER SILVER 16FR (SET/KITS/TRAYS/PACK) IMPLANT
WATER STERILE IRR 1000ML POUR (IV SOLUTION) ×6 IMPLANT
WRAP KNEE MAXI GEL POST OP (GAUZE/BANDAGES/DRESSINGS) ×3 IMPLANT
YANKAUER SUCT BULB TIP 10FT TU (MISCELLANEOUS) ×3 IMPLANT

## 2017-12-23 NOTE — Anesthesia Preprocedure Evaluation (Addendum)
Anesthesia Evaluation  Patient identified by MRN, date of birth, ID band Patient awake    Reviewed: Allergy & Precautions, NPO status , Patient's Chart, lab work & pertinent test results  Airway Mallampati: II  TM Distance: >3 FB Neck ROM: Full    Dental  (+) Dental Advisory Given   Pulmonary Current Smoker,    breath sounds clear to auscultation       Cardiovascular hypertension, Pt. on medications and Pt. on home beta blockers  Rhythm:Regular Rate:Normal     Neuro/Psych negative neurological ROS     GI/Hepatic Neg liver ROS, GERD  ,  Endo/Other  negative endocrine ROS  Renal/GU negative Renal ROS     Musculoskeletal   Abdominal   Peds  Hematology negative hematology ROS (+)   Anesthesia Other Findings   Reproductive/Obstetrics                            Lab Results  Component Value Date   CREATININE 0.85 12/17/2017   BUN 16 12/17/2017   NA 140 12/17/2017   K 4.3 12/17/2017   CL 109 12/17/2017   CO2 25 12/17/2017   Lab Results  Component Value Date   WBC 8.0 12/17/2017   HGB 14.0 12/17/2017   HCT 41.3 12/17/2017   MCV 86.6 12/17/2017   PLT 306 12/17/2017   No results found for: INR, PROTIME  Anesthesia Physical Anesthesia Plan  ASA: II  Anesthesia Plan: Spinal   Post-op Pain Management:  Regional for Post-op pain   Induction: Intravenous  PONV Risk Score and Plan: 2 and Ondansetron, Dexamethasone, Propofol infusion, Treatment may vary due to age or medical condition and Midazolam  Airway Management Planned: Natural Airway and Simple Face Mask  Additional Equipment:   Intra-op Plan:   Post-operative Plan:   Informed Consent: I have reviewed the patients History and Physical, chart, labs and discussed the procedure including the risks, benefits and alternatives for the proposed anesthesia with the patient or authorized representative who has indicated his/her  understanding and acceptance.     Plan Discussed with: CRNA  Anesthesia Plan Comments:        Anesthesia Quick Evaluation

## 2017-12-23 NOTE — Transfer of Care (Signed)
Immediate Anesthesia Transfer of Care Note  Patient: Amber Meyer  Procedure(s) Performed: RIGHT TOTAL KNEE ARTHROPLASTY WITH COMPUTER NAVIGATION (Right Knee)  Patient Location: PACU  Anesthesia Type:Spinal and MAC combined with regional for post-op pain  Level of Consciousness: awake, alert , oriented and patient cooperative  Airway & Oxygen Therapy: Patient Spontanous Breathing and Patient connected to face mask oxygen  Post-op Assessment: Report given to RN, Post -op Vital signs reviewed and stable and Patient moving all extremities X 4  Post vital signs: Reviewed and stable  Last Vitals:  Vitals:   12/23/17 1020 12/23/17 1025  BP: 136/76   Pulse: 62 (!) 59  Resp: 18 13  Temp:    SpO2: 100% 100%    Last Pain:  Vitals:   12/23/17 0854  TempSrc:   PainSc: 9          Complications: No apparent anesthesia complications

## 2017-12-23 NOTE — Progress Notes (Signed)
AssistedDr. Rob Fitzgerald with right, ultrasound guided, adductor canal block. Side rails up, monitors on throughout procedure. See vital signs in flow sheet. Tolerated Procedure well.  

## 2017-12-23 NOTE — Anesthesia Postprocedure Evaluation (Signed)
Anesthesia Post Note  Patient: Amber Meyer  Procedure(s) Performed: RIGHT TOTAL KNEE ARTHROPLASTY WITH COMPUTER NAVIGATION (Right Knee)     Patient location during evaluation: PACU Anesthesia Type: Spinal Level of consciousness: awake and alert Pain management: pain level controlled Vital Signs Assessment: post-procedure vital signs reviewed and stable Respiratory status: spontaneous breathing and respiratory function stable Cardiovascular status: blood pressure returned to baseline and stable Postop Assessment: spinal receding Anesthetic complications: no    Last Vitals:  Vitals:   12/23/17 1528 12/23/17 1629  BP: 136/73 (!) 141/77  Pulse: (!) 56 62  Resp: 12 12  Temp: 36.6 C (!) 36.4 C  SpO2: 100% 100%    Last Pain:  Vitals:   12/23/17 1629  TempSrc: Oral  PainSc:                  [name]

## 2017-12-23 NOTE — Anesthesia Procedure Notes (Signed)
Procedure Name: MAC Date/Time: 12/23/2017 11:16 AM Performed by: West Pugh, CRNA Pre-anesthesia Checklist: Patient identified, Emergency Drugs available, Suction available, Patient being monitored and Timeout performed Patient Re-evaluated:Patient Re-evaluated prior to induction Oxygen Delivery Method: Nasal cannula and Simple face mask Placement Confirmation: positive ETCO2 and CO2 detector Dental Injury: Teeth and Oropharynx as per pre-operative assessment

## 2017-12-23 NOTE — Interval H&P Note (Signed)
History and Physical Interval Note:  12/23/2017 10:06 AM  Amber Meyer  has presented today for surgery, with the diagnosis of Degenerative joint disease right knee  The various methods of treatment have been discussed with the patient and family. After consideration of risks, benefits and other options for treatment, the patient has consented to  Procedure(s) with comments: RIGHT TOTAL KNEE ARTHROPLASTY WITH COMPUTER NAVIGATION (Right) - NEEDS RNFA as a surgical intervention .  The patient's history has been reviewed, patient examined, no change in status, stable for surgery.  I have reviewed the patient's chart and labs.  Questions were answered to the patient's satisfaction.     Hilton Cork Michala Deblanc

## 2017-12-23 NOTE — Op Note (Signed)
OPERATIVE REPORT  SURGEON: Rod Can, MD   ASSISTANT: Nehemiah Massed, PA-C.  PREOPERATIVE DIAGNOSIS: Right knee arthritis.   POSTOPERATIVE DIAGNOSIS: Right knee arthritis.   PROCEDURE: Right total knee arthroplasty.   IMPLANTS: Stryker Triathlon CR femur, size 4. Stryker Tritanium tibia, size 4. X3 polyethelyene insert, size 9 mm, CR. 3 button asymmetric patella, size 32 mm.  ANESTHESIA:  Regional and Spinal  TOURNIQUET TIME: Not utilized.   ESTIMATED BLOOD LOSS:-150 mL    ANTIBIOTICS: 2 g Ancef.  DRAINS: None.  COMPLICATIONS: None   CONDITION: PACU - hemodynamically stable.   BRIEF CLINICAL NOTE: Amber Meyer is a 62 y.o. female with a long-standing history of Right knee arthritis. After failing conservative management, the patient was indicated for total knee arthroplasty. The risks, benefits, and alternatives to the procedure were explained, and the patient elected to proceed.  PROCEDURE IN DETAIL: Adductor canal block was obtained in the pre-op holding area. Once inside the operative room, spinal anesthesia was obtained, and a foley catheter was inserted. The patient was then positioned, a nonsterile tourniquet was placed, and the lower extremity was prepped and draped in the normal sterile surgical fashion. A time-out was called verifying side and site of surgery. The patient received IV antibiotics within 60 minutes of beginning the procedure. The tourniquet was not utilized.  An anterior approach to the knee was performed utilizing a midvastus arthrotomy. A medial release was performed and the patellar fat pad was excised. Stryker navigation was used to cut the distal femur perpendicular to the mechanical axis. A freehand patellar resection was performed, and the patella was sized an prepared with 3 lug holes.  Nagivation was used to make a neutral proximal  tibia resection, taking 3 mm of bone from the less affected medial side with 3 degrees of slope. The menisci were excised. A spacer block was placed, and the alignment and balance in extension were confirmed.   The distal femur was sized using the 3-degree external rotation guide referencing the posterior femoral cortex. The appropriate 4-in-1 cutting block was pinned into place. Rotation was checked using Whiteside's line, the epicondylar axis, and then confirmed with a spacer block in flexion. The remaining femoral cuts were performed, taking care to protect the MCL.  The tibia was sized and the trial tray was pinned into place. The remaining trail components were inserted. The knee was stable to varus and valgus stress through a full range of motion. The patella tracked centrally, and the PCL was well balanced. The trial components were removed, and the proximal tibial surface was prepared. Final components were impacted into place. The knee was tested for a final time and found to be well balanced.  The wound was copiously irrigated with normal saline with pulse lavage. Marcaine solution was injected into the periarticular soft tissue. The wound was closed in layers using #1 Vicryl and Stratafix for the fascia, 2-0 Vicryl for the subcutaneous fat, 2-0 Monocryl for the deep dermal layer, 3-0 running Monocryl subcuticular Stitch, and Dermabond for the skin. Once the glue was fully dried, an Aquacell Ag and compressive dressing were applied. Tthe patient was transported to the recovery room in stable condition. Sponge, needle, and instrument counts were correct at the end of the case x2. The patient tolerated the procedure well and there were no known complications.  Please note that a surgical assistant was a medical necessity for this procedure in order to perform it in a safe and expeditious manner. Surgical assistant was necessary  to retract the ligaments and vital neurovascular structures to  prevent injury to them and also necessary for proper positioning of the limb to allow for anatomic placement of the prosthesis.

## 2017-12-23 NOTE — Progress Notes (Signed)
Portable AP  And Lateral Right Knee X-rays done.

## 2017-12-23 NOTE — Anesthesia Procedure Notes (Signed)
Anesthesia Regional Block: Adductor canal block   Pre-Anesthetic Checklist: ,, timeout performed, Correct Patient, Correct Site, Correct Laterality, Correct Procedure, Correct Position, site marked, Risks and benefits discussed,  Surgical consent,  Pre-op evaluation,  At surgeon's request and post-op pain management  Laterality: Right  Prep: chloraprep       Needles:  Injection technique: Single-shot  Needle Type: Echogenic Needle     Needle Length: 9cm  Needle Gauge: 21     Additional Needles:   Procedures:,,,, ultrasound used (permanent image in chart),,,,  Narrative:  Start time: 12/23/2017 10:05 AM End time: 12/23/2017 10:11 AM Injection made incrementally with aspirations every 5 mL.  Performed by: Personally  Anesthesiologist: Suzette Battiest, MD

## 2017-12-23 NOTE — Anesthesia Procedure Notes (Signed)
Spinal  Patient location during procedure: OR Start time: 12/23/2017 11:15 AM End time: 12/23/2017 11:22 AM Reason for block: at surgeon's request Staffing Resident/CRNA: West Pugh, CRNA Performed: resident/CRNA  Preanesthetic Checklist Completed: patient identified, site marked, surgical consent, pre-op evaluation, timeout performed, IV checked, risks and benefits discussed and monitors and equipment checked Spinal Block Patient position: sitting Prep: DuraPrep Patient monitoring: heart rate, continuous pulse ox and blood pressure Approach: midline Location: L3-4 Injection technique: single-shot Needle Needle type: Pencan  Needle gauge: 24 G Needle length: 9 cm Assessment Sensory level: T6 Additional Notes Expiration of kit checked and confirmed. Patient tolerated procedure well,without complications  with noted clear CSF preinjection,mid-injection and post injection of local anesthetice. Loss of motor and sensory on exam post injection.

## 2017-12-23 NOTE — Progress Notes (Signed)
X-ray results noted 

## 2017-12-24 LAB — BASIC METABOLIC PANEL
Anion gap: 4 — ABNORMAL LOW (ref 5–15)
BUN: 13 mg/dL (ref 6–20)
CO2: 27 mmol/L (ref 22–32)
Calcium: 9.5 mg/dL (ref 8.9–10.3)
Chloride: 109 mmol/L (ref 101–111)
Creatinine, Ser: 0.82 mg/dL (ref 0.44–1.00)
GFR calc Af Amer: >60 mL/min (ref >60)
GFR calc non Af Amer: >60 mL/min (ref >60)
Glucose, Bld: 152 mg/dL — ABNORMAL HIGH (ref 65–99)
Potassium: 4.4 mmol/L (ref 3.5–5.1)
Sodium: 140 mmol/L (ref 135–145)

## 2017-12-24 LAB — CBC
HCT: 35.6 % — ABNORMAL LOW (ref 36.0–46.0)
Hemoglobin: 11.9 g/dL — ABNORMAL LOW (ref 12.0–15.0)
MCH: 28.8 pg (ref 26.0–34.0)
MCHC: 33.4 g/dL (ref 30.0–36.0)
MCV: 86.2 fL (ref 78.0–100.0)
Platelets: 283 10*3/uL (ref 150–400)
RBC: 4.13 MIL/uL (ref 3.87–5.11)
RDW: 13.9 % (ref 11.5–15.5)
WBC: 13.4 10*3/uL — ABNORMAL HIGH (ref 4.0–10.5)

## 2017-12-24 MED ORDER — OXYCODONE HCL 5 MG PO TABS: 5.0000 mg | ORAL_TABLET | ORAL | 0 refills | Status: DC | PRN

## 2017-12-24 MED ORDER — SENNA 8.6 MG PO TABS: 2.0000 | ORAL_TABLET | Freq: Every day | ORAL | 0 refills | Status: DC

## 2017-12-24 MED ORDER — ONDANSETRON HCL 4 MG PO TABS: 4.0000 mg | ORAL_TABLET | Freq: Four times a day (QID) | ORAL | 0 refills | Status: DC | PRN

## 2017-12-24 MED ORDER — APIXABAN 2.5 MG PO TABS: 2.5000 mg | ORAL_TABLET | Freq: Two times a day (BID) | ORAL | 0 refills | Status: DC

## 2017-12-24 MED ORDER — DOCUSATE SODIUM 100 MG PO CAPS: 100.0000 mg | ORAL_CAPSULE | Freq: Two times a day (BID) | ORAL | 1 refills | Status: DC

## 2017-12-24 NOTE — Progress Notes (Signed)
   Subjective:  Patient reports pain as mild to moderate.  Denies N/V/CP/SOB.  Objective:   VITALS:   Vitals:   12/23/17 1832 12/23/17 2100 12/24/17 0052 12/24/17 0550  BP: (!) 138/95 (!) 149/75 (!) 151/73 140/70  Pulse: 69 72 79 66  Resp: 14 16 15 14   Temp: 97.7 F (36.5 C) 97.6 F (36.4 C) 98 F (36.7 C) 97.8 F (36.6 C)  TempSrc: Oral Oral Oral Oral  SpO2: 100% 100% 100% 99%  Weight:      Height:        NAD ABD soft Sensation intact distally Intact pulses distally Dorsiflexion/Plantar flexion intact Incision: dressing C/D/I Compartment soft   Lab Results  Component Value Date   WBC 13.4 (H) 12/24/2017   HGB 11.9 (L) 12/24/2017   HCT 35.6 (L) 12/24/2017   MCV 86.2 12/24/2017   PLT 283 12/24/2017   BMET    Component Value Date/Time   NA 140 12/24/2017 0536   K 4.4 12/24/2017 0536   CL 109 12/24/2017 0536   CO2 27 12/24/2017 0536   GLUCOSE 152 (H) 12/24/2017 0536   BUN 13 12/24/2017 0536   CREATININE 0.82 12/24/2017 0536   CREATININE 0.83 01/16/2014 1205   CALCIUM 9.5 12/24/2017 0536   GFRNONAA >60 12/24/2017 0536   GFRAA >60 12/24/2017 0536     Assessment/Plan: 1 Day Post-Op   Principal Problem:   Osteoarthritis of right knee   WBAT with walker Apixaban, SCDs, TEDs PO pain control PT/OT D/C home tomorrow   Hilton Cork Kristena Wilhelmi 12/24/2017, 9:32 AM   Rod Can, MD Cell [phone]

## 2017-12-24 NOTE — Discharge Summary (Signed)
Physician Discharge Summary  Patient ID: Amber Meyer MRN: [id number] DOB/AGE: [id number] 64 y.o.  Admit date: 12/23/2017 Discharge date: 12/25/2017  Admission Diagnoses:  Osteoarthritis of right knee  Discharge Diagnoses:  Principal Problem:   Osteoarthritis of right knee   Past Medical History:  Diagnosis Date  . GERD (gastroesophageal reflux disease)   . Hypertension   . Renal disorder    3 kidney stones  . Sinus bradycardia seen on cardiac monitor   . Urinary frequency     Surgeries: Procedure(s): RIGHT TOTAL KNEE ARTHROPLASTY WITH COMPUTER NAVIGATION on 12/23/2017   Consultants (if any):   Discharged Condition: Improved  Hospital Course: Amber Meyer is an 62 y.o. female who was admitted 12/23/2017 with a diagnosis of Osteoarthritis of right knee and went to the operating room on 12/23/2017 and underwent the above named procedures.    She was given perioperative antibiotics:  Anti-infectives (From admission, onward)   Start     Dose/Rate Route Frequency Ordered Stop   12/23/17 1745  ceFAZolin (ANCEF) IVPB 2g/100 mL premix     2 g 200 mL/hr over 30 Minutes Intravenous Every 6 hours 12/23/17 1531 12/24/17 0116   12/23/17 0819  ceFAZolin (ANCEF) IVPB 2g/100 mL premix     2 g 200 mL/hr over 30 Minutes Intravenous On call to O.R. 12/23/17 8938 12/23/17 1154    .  She was given sequential compression devices, early ambulation, and apixaban for DVT prophylaxis.  She benefited maximally from the hospital stay and there were no complications.    Recent vital signs:  Vitals:   12/24/17 2056 12/25/17 0505  BP: (!) 149/62 138/73  Pulse: 71 73  Resp: 17 16  Temp: 98.4 F (36.9 C) 98.6 F (37 C)  SpO2: 96% 100%    Recent laboratory studies:  Lab Results  Component Value Date   HGB 11.0 (L) 12/25/2017   HGB 11.9 (L) 12/24/2017   HGB 14.0 12/17/2017   Lab Results  Component Value Date   WBC 14.1 (H) 12/25/2017   PLT 271 12/25/2017   No results  found for: INR Lab Results  Component Value Date   NA 140 12/24/2017   K 4.4 12/24/2017   CL 109 12/24/2017   CO2 27 12/24/2017   BUN 13 12/24/2017   CREATININE 0.82 12/24/2017   GLUCOSE 152 (H) 12/24/2017    Discharge Medications:   Allergies as of 12/25/2017      Reactions   Hydrocodone-acetaminophen Hives, Other (See Comments)   Can take with Benadryl       Medication List    STOP taking these medications   aspirin EC 81 MG tablet   diclofenac 75 MG EC tablet Commonly known as:  VOLTAREN   ibuprofen 400 MG tablet Commonly known as:  ADVIL,MOTRIN   methocarbamol 500 MG tablet Commonly known as:  ROBAXIN   traMADol 50 MG tablet Commonly known as:  ULTRAM     TAKE these medications   albuterol 108 (90 Base) MCG/ACT inhaler Commonly known as:  PROVENTIL HFA;VENTOLIN HFA Inhale 2 puffs into the lungs every 4 (four) hours as needed for wheezing.   Amlodipine-Valsartan-HCTZ 5-160-25 MG Tabs Take 1 tablet by mouth daily.   apixaban 2.5 MG Tabs tablet Commonly known as:  ELIQUIS Take 1 tablet (2.5 mg total) by mouth 2 (two) times daily.   chlorpheniramine-HYDROcodone 10-8 MG/5ML Lqcr Commonly known as:  TUSSIONEX PENNKINETIC ER Take 5 mLs by mouth every 12 (twelve) hours as needed.   clonazePAM 0.5  MG tablet Commonly known as:  KLONOPIN Take 1 tablet (0.5 mg total) by mouth 2 (two) times daily as needed for anxiety.   co-enzyme Q-10 30 MG capsule Take 30 mg by mouth daily.   DEXILANT 30 MG capsule Generic drug:  Dexlansoprazole Take 30 mg by mouth daily.   docusate sodium 100 MG capsule Commonly known as:  COLACE Take 1 capsule (100 mg total) by mouth 2 (two) times daily.   metoprolol tartrate 100 MG tablet Commonly known as:  LOPRESSOR Take 100 mg by mouth at bedtime.   MYRBETRIQ 50 MG Tb24 tablet Generic drug:  mirabegron ER Take 50 mg by mouth daily.   ondansetron 4 MG tablet Commonly known as:  ZOFRAN Take 1 tablet (4 mg total) by mouth every  6 (six) hours as needed for nausea.   ONE-A-DAY WOMENS PO Take 1 tablet by mouth daily.   oxyCODONE 5 MG immediate release tablet Commonly known as:  Oxy IR/ROXICODONE Take 1 tablet (5 mg total) by mouth every 4 (four) hours as needed (knee pain).   senna 8.6 MG Tabs tablet Commonly known as:  SENOKOT Take 2 tablets (17.2 mg total) by mouth at bedtime.   THERA-GESIC EX Apply 1 application topically as needed (for pain).       Diagnostic Studies: Dg Knee Right Port  Result Date: 12/23/2017 CLINICAL DATA:  Post right total knee replacement EXAM: PORTABLE RIGHT KNEE - 1-2 VIEW COMPARISON:  None. FINDINGS: Right knee arthroplasty hardware appears intact and appropriately positioned. Osseous alignment is anatomic. No acute appearing osseous abnormality. Expected postsurgical changes within the surrounding soft tissues. IMPRESSION: Status post right knee arthroplasty. Hardware appears intact and appropriately positioned. No evidence of surgical complicating feature. Electronically Signed   By: Franki Cabot M.D.   On: 12/23/2017 14:55    Disposition: 01-Home or Self Care  Discharge Instructions    Call MD / Call 911   Complete by:  As directed    If you experience chest pain or shortness of breath, CALL 911 and be transported to the hospital emergency room.  If you develope a fever above 101 F, pus (white drainage) or increased drainage or redness at the wound, or calf pain, call your surgeon's office.   Constipation Prevention   Complete by:  As directed    Drink plenty of fluids.  Prune juice may be helpful.  You may use a stool softener, such as Colace (over the counter) 100 mg twice a day.  Use MiraLax (over the counter) for constipation as needed.   Diet - low sodium heart healthy   Complete by:  As directed    Increase activity slowly as tolerated   Complete by:  As directed       Follow-up Information    Nori Poland, Aaron Edelman, MD. Schedule an appointment as soon as possible for a  visit in 2 week(s).   Specialty:  Orthopedic Surgery Why:  For wound re-check Contact information: [address] [place]  [address] [phone]            Signed: Hilton Cork Codie Krogh 12/27/2017, 7:46 AM

## 2017-12-24 NOTE — Evaluation (Signed)
Occupational Therapy Evaluation Patient Details Name: Amber Meyer MRN: [id number] DOB: [birth year] Today's Date: 12/24/2017    History of Present Illness Pt s/p R TKR    Clinical Impression   This 62 year old female was admitted for the above sx. All education was completed. No further OT is needed at this time    Follow Up Recommendations  Supervision/Assistance - 24 hour    Equipment Recommendations  3 in 1 bedside commode    Recommendations for Other Services       Precautions / Restrictions Precautions Precautions: Fall;Knee Restrictions Weight Bearing Restrictions: No Other Position/Activity Restrictions: WBAT      Mobility Bed Mobility Overal bed mobility: Needs Assistance Bed Mobility: Supine to Sit     Supine to sit: Min assist Sit to supine: Min assist   General bed mobility comments: for RLE  Transfers Overall transfer level: Needs assistance Equipment used: Rolling walker (2 wheeled) Transfers: Sit to/from Stand Sit to Stand: Min guard         General transfer comment: for safety; cues for UE/LE placement    Balance                                           ADL either performed or assessed with clinical judgement   ADL Overall ADL's : Needs assistance/impaired     Grooming: Supervision/safety;Standing       Lower Body Bathing: Min guard;Sit to/from stand       Lower Body Dressing: Minimal assistance;Sit to/from stand   Toilet Transfer: Min guard;Ambulation;BSC;RW   Toileting- Water quality scientist and Hygiene: Min guard;Sit to/from stand         General ADL Comments: educated on tub readiness.  Pt will sponge bathe initially.  She will have assistance for adls and needs very little help.     Vision         Perception     Praxis      Pertinent Vitals/Pain Pain Assessment: 0-10 Pain Score: 4  Pain Location: R knee Pain Descriptors / Indicators: Aching;Sore Pain Intervention(s): Limited  activity within patient's tolerance;Monitored during session;Premedicated before session;Repositioned;Ice applied     Hand Dominance     Extremity/Trunk Assessment Upper Extremity Assessment Upper Extremity Assessment: Overall WFL for tasks assessed      Cervical / Trunk Assessment Cervical / Trunk Assessment: Normal   Communication Communication Communication: No difficulties   Cognition Arousal/Alertness: Awake/alert Behavior During Therapy: WFL for tasks assessed/performed Overall Cognitive Status: Within Functional Limits for tasks assessed                                     General Comments       Exercises Exercises: Total Joint Total Joint Exercises Ankle Circles/Pumps: AROM;Both;Supine;15 reps Quad Sets: AROM;Both;10 reps;Supine Heel Slides: AAROM;Right;15 reps;Supine Straight Leg Raises: AAROM;AROM;Right;15 reps;Supine   Shoulder Instructions      Home Living Family/patient expects to be discharged to:: Private residence Living Arrangements: Children Available Help at Discharge: Available 24 hours/day Type of Home: House Home Access: Stairs to enter [employer] of Steps: 5 Entrance Stairs-Rails: Right;Left;Can reach both Home Layout: Two level Alternate Level Stairs-Number of Steps: 13 Alternate Level Stairs-Rails: Right Bathroom Shower/Tub: Teacher, early years/pre: Standard     Home Equipment: Environmental consultant - 2 wheels  Additional Comments: Pt lives in basement of dtrs home      Prior Functioning/Environment Level of Independence: Independent                 OT Problem List:        OT Treatment/Interventions:      OT Goals(Current goals can be found in the care plan section) Acute Rehab OT Goals Patient Stated Goal: Regain IND OT Goal Formulation: All assessment and education complete, DC therapy  OT Frequency:     Barriers to D/C:            Co-evaluation              AM-PAC PT "6 Clicks"  Daily Activity     Outcome Measure Help from another person eating meals?: None Help from another person taking care of personal grooming?: A Little Help from another person toileting, which includes using toliet, bedpan, or urinal?: A Little Help from another person bathing (including washing, rinsing, drying)?: A Little Help from another person to put on and taking off regular upper body clothing?: A Little Help from another person to put on and taking off regular lower body clothing?: A Little 6 Click Score: 19   End of Session    Activity Tolerance: Patient tolerated treatment well Patient left: in bed;with call bell/phone within reach  OT Visit Diagnosis: Pain Pain - Right/Left: Right Pain - part of body: Knee                Time: 1100-1119 OT Time Calculation (min): 19 min Charges:  OT General Charges $OT Visit: 1 Visit OT Evaluation $OT Eval Low Complexity: 1 Low G-Codes:     Lesle Chris, OTR/L [phone] 12/24/2017  Amber Meyer 12/24/2017, 12:36 PM

## 2017-12-24 NOTE — Discharge Instructions (Signed)
Information on my medicine - ELIQUIS (apixaban)  This medication education was reviewed with me or my healthcare representative as part of my discharge preparation.   Why was Eliquis prescribed for you? Eliquis was prescribed for you to reduce the risk of blood clots forming after orthopedic surgery.    What do You need to know about Eliquis? Take your Eliquis TWICE DAILY - one tablet in the morning and one tablet in the evening with or without food.  It would be best to take the dose about the same time each day.  If you have difficulty swallowing the tablet whole please discuss with your pharmacist how to take the medication safely.  Take Eliquis exactly as prescribed by your doctor and DO NOT stop taking Eliquis without talking to the doctor who prescribed the medication.  Stopping without other medication to take the place of Eliquis may increase your risk of developing a clot.  After discharge, you should have regular check-up appointments with your healthcare provider that is prescribing your Eliquis.  What do you do if you miss a dose? If a dose of ELIQUIS is not taken at the scheduled time, take it as soon as possible on the same day and twice-daily administration should be resumed.  The dose should not be doubled to make up for a missed dose.  Do not take more than one tablet of ELIQUIS at the same time.  Important Safety Information A possible side effect of Eliquis is bleeding. You should call your healthcare provider right away if you experience any of the following: ? Bleeding from an injury or your nose that does not stop. ? Unusual colored urine (red or dark brown) or unusual colored stools (red or black). ? Unusual bruising for unknown reasons. ? A serious fall or if you hit your head (even if there is no bleeding).  Some medicines may interact with Eliquis and might increase your risk of bleeding or clotting while on Eliquis. To help avoid this, consult your  healthcare provider or pharmacist prior to using any new prescription or non-prescription medications, including herbals, vitamins, non-steroidal anti-inflammatory drugs (NSAIDs) and supplements.  This website has more information on Eliquis (apixaban): [email or web address] Dr. Rod Can Total Joint Specialist [facility] [address] [place], [place] [address] [phone]  TOTAL KNEE REPLACEMENT POSTOPERATIVE DIRECTIONS    Knee Rehabilitation, Guidelines Following Surgery  Results after knee surgery are often greatly improved when you follow the exercise, range of motion and muscle strengthening exercises prescribed by your doctor. Safety measures are also important to protect the knee from further injury. Any time any of these exercises cause you to have increased pain or swelling in your knee joint, decrease the amount until you are comfortable again and slowly increase them. If you have problems or questions, call your caregiver or physical therapist for advice.   WEIGHT BEARING Weight bearing as tolerated with assist device (walker, cane, etc) as directed, use it as long as suggested by your surgeon or therapist, typically at least 4-6 weeks.  HOME CARE INSTRUCTIONS  Remove items at home which could result in a fall. This includes throw rugs or furniture in walking pathways.  Continue medications as instructed at time of discharge. You may have some home medications which will be placed on hold until you complete the course of blood thinner medication.  You may start showering once you are discharged home but do not submerge the incision under water. Just pat the incision dry and  apply a dry gauze dressing on daily. Walk with walker as instructed.  You may resume a sexual relationship in one month or when given the OK by your doctor.   Use walker as long as suggested by your caregivers.  Avoid periods of inactivity such as sitting  longer than an hour when not asleep. This helps prevent blood clots.  You may put full weight on your legs and walk as much as is comfortable.  You may return to work once you are cleared by your doctor.  Do not drive a car for 6 weeks or until released by you surgeon.   Do not drive while taking narcotics.  Wear the elastic stockings for three weeks following surgery during the day but you may remove then at night. Make sure you keep all of your appointments after your operation with all of your doctors and caregivers. You should call the office at the above phone number and make an appointment for approximately two weeks after the date of your surgery. Do not remove your surgical dressing. The dressing is waterproof; you may take showers in 3 days, but do not take tub baths or submerge the dressing. Please pick up a stool softener and laxative for home use as long as you are requiring pain medications.  ICE to the affected knee every three hours for 30 minutes at a time and then as needed for pain and swelling.  Continue to use ice on the knee for pain and swelling from surgery. You may notice swelling that will progress down to the foot and ankle.  This is normal after surgery.  Elevate the leg when you are not up walking on it.   It is important for you to complete the blood thinner medication as prescribed by your doctor.  Continue to use the breathing machine which will help keep your temperature down.  It is common for your temperature to cycle up and down following surgery, especially at night when you are not up moving around and exerting yourself.  The breathing machine keeps your lungs expanded and your temperature down.  RANGE OF MOTION AND STRENGTHENING EXERCISES  Rehabilitation of the knee is important following a knee injury or an operation. After just a few days of immobilization, the muscles of the thigh which control the knee become weakened and shrink (atrophy). Knee exercises are  designed to build up the tone and strength of the thigh muscles and to improve knee motion. Often times heat used for twenty to thirty minutes before working out will loosen up your tissues and help with improving the range of motion but do not use heat for the first two weeks following surgery. These exercises can be done on a training (exercise) mat, on the floor, on a table or on a bed. Use what ever works the best and is most comfortable for you Knee exercises include:  Leg Lifts - While your knee is still immobilized in a splint or cast, you can do straight leg raises. Lift the leg to 60 degrees, hold for 3 sec, and slowly lower the leg. Repeat 10-20 times 2-3 times daily. Perform this exercise against resistance later as your knee gets better.  Quad and Hamstring Sets - Tighten up the muscle on the front of the thigh (Quad) and hold for 5-10 sec. Repeat this 10-20 times hourly. Hamstring sets are done by pushing the foot backward against an object and holding for 5-10 sec. Repeat as with quad sets.  A  rehabilitation program following serious knee injuries can speed recovery and prevent re-injury in the future due to weakened muscles. Contact your doctor or a physical therapist for more information on knee rehabilitation.   SKILLED REHAB INSTRUCTIONS: If the patient is transferred to a skilled rehab facility following release from the hospital, a list of the current medications will be sent to the facility for the patient to continue.  When discharged from the skilled rehab facility, please have the facility set up the patient's [place] prior to being released. Also, the skilled facility will be responsible for providing the patient with their medications at time of release from the facility to include their pain medication, the muscle relaxants, and their blood thinner medication. If the patient is still at the rehab facility at time of the two week follow up appointment, the skilled  rehab facility will also need to assist the patient in arranging follow up appointment in our office and any transportation needs.  MAKE SURE YOU:  Understand these instructions.  Will watch your condition.  Will get help right away if you are not doing well or get worse.    Pick up stool softner and laxative for home use following surgery while on pain medications. Do NOT remove your dressing. You may shower.  Do not take tub baths or submerge incision under water. May shower starting three days after surgery. Please use a clean towel to pat the incision dry following showers. Continue to use ice for pain and swelling after surgery. Do not use any lotions or creams on the incision until instructed by your surgeon.

## 2017-12-24 NOTE — Progress Notes (Signed)
Physical Therapy Treatment Patient Details Name: Amber Meyer MRN: [id number] DOB: 01-09-1956 Today's Date: 12/24/2017    History of Present Illness Pt s/p R TKR     PT Comments    Pt progressing well with mobility and hopeful for return home tomorrow.  Pt ambulating increased distance this pm and with decreased pain.   Follow Up Recommendations  Home health PT     Equipment Recommendations  None recommended by PT    Recommendations for Other Services OT consult     Precautions / Restrictions Precautions Precautions: Fall;Knee Restrictions Weight Bearing Restrictions: No Other Position/Activity Restrictions: WBAT    Mobility  Bed Mobility Overal bed mobility: Needs Assistance Bed Mobility: Supine to Sit     Supine to sit: Min guard Sit to supine: Min assist   General bed mobility comments: cues for sequence  Transfers Overall transfer level: Needs assistance Equipment used: Rolling walker (2 wheeled) Transfers: Sit to/from Stand Sit to Stand: Min guard         General transfer comment: for safety; cues for UE/LE placement  Ambulation/Gait Ambulation/Gait assistance: Min guard Ambulation Distance (Feet): 120 Feet Assistive device: Rolling walker (2 wheeled) Gait Pattern/deviations: Step-to pattern;Decreased step length - right;Decreased step length - left;Shuffle;Trunk flexed Gait velocity: decr Gait velocity interpretation: Below normal speed for age/gender General Gait Details: cues for sequence, posture and position from [employer]            Wheelchair Mobility    Modified Rankin (Stroke Patients Only)       Balance                                            Cognition Arousal/Alertness: Awake/alert Behavior During Therapy: WFL for tasks assessed/performed Overall Cognitive Status: Within Functional Limits for tasks assessed                                        Exercises Total Joint  Exercises Ankle Circles/Pumps: AROM;Both;Supine;15 reps Quad Sets: AROM;Both;10 reps;Supine Heel Slides: AAROM;Right;15 reps;Supine Straight Leg Raises: AAROM;AROM;Right;15 reps;Supine    General Comments        Pertinent Vitals/Pain Pain Assessment: 0-10 Pain Score: 4  Pain Location: R knee Pain Descriptors / Indicators: Aching;Sore Pain Intervention(s): Limited activity within patient's tolerance;Monitored during session;Premedicated before session    Home Living Family/patient expects to be discharged to:: Private residence Living Arrangements: Children Available Help at Discharge: Available 24 hours/day Type of Home: House Home Access: Stairs to enter Entrance Stairs-Rails: Right;Left;Can reach both Home Layout: Two level Home Equipment: Environmental consultant - 2 wheels Additional Comments: Pt lives in basement of dtrs home    Prior Function Level of Independence: Independent          PT Goals (current goals can now be found in the care plan section) Acute Rehab PT Goals Patient Stated Goal: Regain IND PT Goal Formulation: With patient Time For Goal Achievement: 12/31/17 Potential to Achieve Goals: Good Progress towards PT goals: Progressing toward goals    Frequency    7X/week      PT Plan Current plan remains appropriate    Co-evaluation              AM-PAC PT "6 Clicks" Daily Activity  Outcome Measure  Difficulty turning over in  bed (including adjusting bedclothes, sheets and blankets)?: A Lot Difficulty moving from lying on back to sitting on the side of the bed? : A Lot Difficulty sitting down on and standing up from a chair with arms (e.g., wheelchair, bedside commode, etc,.)?: A Lot Help needed moving to and from a bed to chair (including a wheelchair)?: A Little Help needed walking in hospital room?: A Little Help needed climbing 3-5 steps with a railing? : A Little 6 Click Score: 15    End of Session Equipment Utilized During Treatment: Gait  belt Activity Tolerance: Patient tolerated treatment well Patient left: Other (comment)(bathroom) Nurse Communication: Mobility status PT Visit Diagnosis: Difficulty in walking, not elsewhere classified (R26.2)     Time: 3612-2449 PT Time Calculation (min) (ACUTE ONLY): 17 min  Charges:  $Gait Training: 8-22 mins $Therapeutic Exercise: 8-22 mins                    G Codes:       Pg [phone]    Seline Enzor 12/24/2017, 2:00 PM

## 2017-12-24 NOTE — Evaluation (Signed)
Physical Therapy Evaluation Patient Details Name: Amber Meyer MRN: [id number] DOB: 09/23/1956 Today's Date: 12/24/2017   History of Present Illness  Pt s/p R TKR   Clinical Impression  Pt s/p R TKR and presents with decreased R LE strength/ROM and post op pain limiting functional mobility.  Pt should progress to dc home with family assist.    Follow Up Recommendations Home health PT    Equipment Recommendations  None recommended by PT    Recommendations for Other Services OT consult     Precautions / Restrictions Precautions Precautions: Fall;Knee Restrictions Weight Bearing Restrictions: No Other Position/Activity Restrictions: WBAT      Mobility  Bed Mobility Overal bed mobility: Needs Assistance Bed Mobility: Supine to Sit     Supine to sit: Min assist     General bed mobility comments: cues for sequence and use of L LE to self assist  Transfers Overall transfer level: Needs assistance Equipment used: Rolling walker (2 wheeled) Transfers: Sit to/from Stand Sit to Stand: Min assist         General transfer comment: cues for LE management and use of UEs to self assist  Ambulation/Gait Ambulation/Gait assistance: Min assist Ambulation Distance (Feet): 60 Feet Assistive device: Rolling walker (2 wheeled) Gait Pattern/deviations: Step-to pattern;Decreased step length - right;Decreased step length - left;Shuffle;Trunk flexed Gait velocity: decr Gait velocity interpretation: Below normal speed for age/gender General Gait Details: cues for sequence, posture and position from [employer]            Wheelchair Mobility    Modified Rankin (Stroke Patients Only)       Balance                                             Pertinent Vitals/Pain Pain Assessment: 0-10 Pain Score: 6  Pain Location: R knee Pain Descriptors / Indicators: Aching;Sore Pain Intervention(s): Limited activity within patient's tolerance;Monitored  during session;Premedicated before session;Ice applied    Home Living Family/patient expects to be discharged to:: Private residence Living Arrangements: Children Available Help at Discharge: Available 24 hours/day Type of Home: House Home Access: Stairs to enter Entrance Stairs-Rails: Right;Left;Can reach both Technical brewer of Steps: 5 Home Layout: Two level Home Equipment: Environmental consultant - 2 wheels Additional Comments: Pt lives in basement of dtrs home    Prior Function Level of Independence: Independent               Hand Dominance        Extremity/Trunk Assessment   Upper Extremity Assessment Upper Extremity Assessment: Overall WFL for tasks assessed    Lower Extremity Assessment Lower Extremity Assessment: RLE deficits/detail RLE Deficits / Details: 3/5 quads with IND SLR and AAROM at knee -10-  80    Cervical / Trunk Assessment Cervical / Trunk Assessment: Normal  Communication   Communication: No difficulties  Cognition Arousal/Alertness: Awake/alert Behavior During Therapy: WFL for tasks assessed/performed Overall Cognitive Status: Within Functional Limits for tasks assessed                                        General Comments      Exercises Total Joint Exercises Ankle Circles/Pumps: AROM;Both;Supine;15 reps Quad Sets: AROM;Both;10 reps;Supine Heel Slides: AAROM;Right;15 reps;Supine Straight Leg Raises: AAROM;AROM;Right;15 reps;Supine   Assessment/Plan  PT Assessment Patient needs continued PT services  PT Problem List Decreased strength;Decreased range of motion;Decreased activity tolerance;Decreased mobility;Pain;Decreased knowledge of use of DME       PT Treatment Interventions DME instruction;Gait training;Stair training;Functional mobility training;Therapeutic activities;Therapeutic exercise;Patient/family education    PT Goals (Current goals can be found in the Care Plan section)  Acute Rehab PT Goals Patient  Stated Goal: Regain IND PT Goal Formulation: With patient Time For Goal Achievement: 12/31/17 Potential to Achieve Goals: Good    Frequency 7X/week   Barriers to discharge        Co-evaluation               AM-PAC PT "6 Clicks" Daily Activity  Outcome Measure Difficulty turning over in bed (including adjusting bedclothes, sheets and blankets)?: A Lot Difficulty moving from lying on back to sitting on the side of the bed? : A Lot Difficulty sitting down on and standing up from a chair with arms (e.g., wheelchair, bedside commode, etc,.)?: A Lot Help needed moving to and from a bed to chair (including a wheelchair)?: A Little Help needed walking in hospital room?: A Little Help needed climbing 3-5 steps with a railing? : A Little 6 Click Score: 15    End of Session Equipment Utilized During Treatment: Gait belt Activity Tolerance: Patient tolerated treatment well Patient left: in chair;with call bell/phone within reach Nurse Communication: Mobility status PT Visit Diagnosis: Difficulty in walking, not elsewhere classified (R26.2)    Time: 3500-9381 PT Time Calculation (min) (ACUTE ONLY): 32 min   Charges:   PT Evaluation $PT Eval Low Complexity: 1 Low PT Treatments $Therapeutic Exercise: 8-22 mins   PT G Codes:        Pg [phone]   Addison Whidbee 12/24/2017, 12:24 PM

## 2017-12-25 LAB — CBC
HCT: 33.1 % — ABNORMAL LOW (ref 36.0–46.0)
Hemoglobin: 11 g/dL — ABNORMAL LOW (ref 12.0–15.0)
MCH: 28.7 pg (ref 26.0–34.0)
MCHC: 33.2 g/dL (ref 30.0–36.0)
MCV: 86.4 fL (ref 78.0–100.0)
Platelets: 271 10*3/uL (ref 150–400)
RBC: 3.83 MIL/uL — ABNORMAL LOW (ref 3.87–5.11)
RDW: 14 % (ref 11.5–15.5)
WBC: 14.1 10*3/uL — ABNORMAL HIGH (ref 4.0–10.5)

## 2017-12-25 NOTE — Care Management Note (Signed)
62 yo F s/p R TKR. Received referral to assist with [facility] and DME. Met with pt at beside. She plans to return home with the support of her son and daughter-in-law. She has a RW. She stated that the CM from workers' comp arranged the [facility] prior to her admission and she scheduled to start therapy on Monday. She doesn't know the name of the [facility] agency. She denies any D/C needs.

## 2017-12-25 NOTE — Progress Notes (Signed)
   Subjective:  Patient reports pain as mild to moderate.  Denies N/V/CP/SOB.  Objective:   VITALS:   Vitals:   12/24/17 0550 12/24/17 1341 12/24/17 2056 12/25/17 0505  BP: 140/70 131/81 (!) 149/62 138/73  Pulse: 66 77 71 73  Resp: 14 14 17 16   Temp: 97.8 F (36.6 C) 98.1 F (36.7 C) 98.4 F (36.9 C) 98.6 F (37 C)  TempSrc: Oral Oral Oral Oral  SpO2: 99% 100% 96% 100%  Weight:      Height:        NAD ABD soft Sensation intact distally Intact pulses distally Dorsiflexion/Plantar flexion intact Incision: dressing C/D/I Compartment soft   Lab Results  Component Value Date   WBC 14.1 (H) 12/25/2017   HGB 11.0 (L) 12/25/2017   HCT 33.1 (L) 12/25/2017   MCV 86.4 12/25/2017   PLT 271 12/25/2017   BMET    Component Value Date/Time   NA 140 12/24/2017 0536   K 4.4 12/24/2017 0536   CL 109 12/24/2017 0536   CO2 27 12/24/2017 0536   GLUCOSE 152 (H) 12/24/2017 0536   BUN 13 12/24/2017 0536   CREATININE 0.82 12/24/2017 0536   CREATININE 0.83 01/16/2014 1205   CALCIUM 9.5 12/24/2017 0536   GFRNONAA >60 12/24/2017 0536   GFRAA >60 12/24/2017 0536     Assessment/Plan: 2 Days Post-Op   Principal Problem:   Osteoarthritis of right knee   WBAT with walker Apixaban, SCDs, TEDs for DVT ppx PO pain control PT/OT D/C home today   Nicholes Stairs 12/25/2017, 9:31 AM

## 2017-12-25 NOTE — Progress Notes (Signed)
Physical Therapy Treatment Patient Details Name: Amber Meyer MRN: [id number] DOB: 10/16/1956 Today's Date: 12/25/2017    History of Present Illness Pt s/p R TKR     PT Comments    Pt performed therex program with written instruction provided and up to bathroom.   Follow Up Recommendations  Home health PT     Equipment Recommendations  None recommended by PT    Recommendations for Other Services OT consult     Precautions / Restrictions Precautions Precautions: Fall;Knee Restrictions Weight Bearing Restrictions: No Other Position/Activity Restrictions: WBAT    Mobility  Bed Mobility Overal bed mobility: Needs Assistance Bed Mobility: Supine to Sit     Supine to sit: Supervision        Transfers Overall transfer level: Needs assistance Equipment used: Rolling walker (2 wheeled) Transfers: Sit to/from Stand Sit to Stand: Supervision         General transfer comment: for safety; cues for UE/LE placement  Ambulation/Gait Ambulation/Gait assistance: Min guard;Supervision Ambulation Distance (Feet): 15 Feet Assistive device: Rolling walker (2 wheeled) Gait Pattern/deviations: Step-to pattern;Decreased step length - right;Decreased step length - left;Shuffle;Trunk flexed Gait velocity: decr Gait velocity interpretation: Below normal speed for age/gender General Gait Details: cues for sequence, posture and position from [employer]            Wheelchair Mobility    Modified Rankin (Stroke Patients Only)       Balance                                            Cognition Arousal/Alertness: Awake/alert Behavior During Therapy: WFL for tasks assessed/performed Overall Cognitive Status: Within Functional Limits for tasks assessed                                        Exercises Total Joint Exercises Ankle Circles/Pumps: AROM;Both;Supine;15 reps Quad Sets: AROM;Both;10 reps;Supine Heel Slides:  Right;Supine;AAROM;20 reps Straight Leg Raises: AAROM;AROM;Right;Supine;20 reps Goniometric ROM: AAROM R knee -10- 45    General Comments        Pertinent Vitals/Pain Pain Assessment: 0-10 Pain Score: 6  Pain Location: R knee Pain Descriptors / Indicators: Aching;Sore Pain Intervention(s): Limited activity within patient's tolerance;Monitored during session;Premedicated before session;Ice applied    Home Living                      Prior Function            PT Goals (current goals can now be found in the care plan section) Acute Rehab PT Goals Patient Stated Goal: Regain IND PT Goal Formulation: With patient Time For Goal Achievement: 12/31/17 Potential to Achieve Goals: Good Progress towards PT goals: Progressing toward goals    Frequency    7X/week      PT Plan Current plan remains appropriate    Co-evaluation              AM-PAC PT "6 Clicks" Daily Activity  Outcome Measure  Difficulty turning over in bed (including adjusting bedclothes, sheets and blankets)?: A Lot Difficulty moving from lying on back to sitting on the side of the bed? : A Lot Difficulty sitting down on and standing up from a chair with arms (e.g., wheelchair, bedside commode, etc,.)?: A Lot Help needed  moving to and from a bed to chair (including a wheelchair)?: A Little Help needed walking in hospital room?: A Little Help needed climbing 3-5 steps with a railing? : A Little 6 Click Score: 15    End of Session Equipment Utilized During Treatment: Gait belt Activity Tolerance: Patient tolerated treatment well Patient left: in chair;with call bell/phone within reach Nurse Communication: Mobility status PT Visit Diagnosis: Difficulty in walking, not elsewhere classified (R26.2)     Time: 9977-4142 PT Time Calculation (min) (ACUTE ONLY): 33 min  Charges:  $Gait Training: 8-22 mins $Therapeutic Exercise: 8-22 mins                    G Codes:       Pg [phone]  [phone]    Ulises Wolfinger 12/25/2017, 10:40 AM

## 2017-12-25 NOTE — Progress Notes (Signed)
Physical Therapy Treatment Patient Details Name: Amber Meyer MRN: [id number] DOB: [birth year] Today's Date: 12/25/2017    History of Present Illness Pt s/p R TKR     PT Comments    Pt ambulating in hall and up/down stairs.  Pt hopeful for dc home this date, RN advised 3n1 has not been delivered.   Follow Up Recommendations  Home health PT     Equipment Recommendations  None recommended by PT    Recommendations for Other Services OT consult     Precautions / Restrictions Precautions Precautions: Fall;Knee Restrictions Weight Bearing Restrictions: No Other Position/Activity Restrictions: WBAT    Mobility  Bed Mobility Overal bed mobility: Needs Assistance Bed Mobility: Supine to Sit     Supine to sit: Supervision        Transfers Overall transfer level: Needs assistance Equipment used: Rolling walker (2 wheeled) Transfers: Sit to/from Stand Sit to Stand: Supervision         General transfer comment: for safety; cues for UE/LE placement  Ambulation/Gait Ambulation/Gait assistance: Min guard;Supervision Ambulation Distance (Feet): 100 Feet Assistive device: Rolling walker (2 wheeled) Gait Pattern/deviations: Step-to pattern;Decreased step length - right;Decreased step length - left;Shuffle;Trunk flexed Gait velocity: decr Gait velocity interpretation: Below normal speed for age/gender General Gait Details: cues for sequence, posture, position from RW and to increase heel contact on R   Stairs Stairs: Yes   Stair Management: One rail Left;Two rails;Step to pattern;Forwards;With crutches Number of Stairs: 7 General stair comments: 2 steps with bil rails and 5 steps with rail and crutch.  Cues for sequence and foot/crutch placement  Wheelchair Mobility    Modified Rankin (Stroke Patients Only)       Balance                                            Cognition Arousal/Alertness: Awake/alert Behavior During Therapy: WFL  for tasks assessed/performed Overall Cognitive Status: Within Functional Limits for tasks assessed                                        Exercises Total Joint Exercises Ankle Circles/Pumps: AROM;Both;Supine;15 reps Quad Sets: AROM;Both;10 reps;Supine Heel Slides: Right;Supine;AAROM;20 reps Straight Leg Raises: AAROM;AROM;Right;Supine;20 reps Goniometric ROM: AAROM R knee -10- 45    General Comments        Pertinent Vitals/Pain Pain Assessment: 0-10 Pain Score: 6  Pain Location: R knee Pain Descriptors / Indicators: Aching;Sore Pain Intervention(s): Limited activity within patient's tolerance;Monitored during session;Premedicated before session;Ice applied    Home Living                      Prior Function            PT Goals (current goals can now be found in the care plan section) Acute Rehab PT Goals Patient Stated Goal: Regain IND PT Goal Formulation: With patient Time For Goal Achievement: 12/31/17 Potential to Achieve Goals: Good Progress towards PT goals: Progressing toward goals    Frequency    7X/week      PT Plan Current plan remains appropriate    Co-evaluation              AM-PAC PT "6 Clicks" Daily Activity  Outcome Measure  Difficulty turning over in bed (including  adjusting bedclothes, sheets and blankets)?: A Lot Difficulty moving from lying on back to sitting on the side of the bed? : A Lot Difficulty sitting down on and standing up from a chair with arms (e.g., wheelchair, bedside commode, etc,.)?: A Lot Help needed moving to and from a bed to chair (including a wheelchair)?: A Little Help needed walking in hospital room?: A Little Help needed climbing 3-5 steps with a railing? : A Little 6 Click Score: 15    End of Session Equipment Utilized During Treatment: Gait belt Activity Tolerance: Patient tolerated treatment well;Patient limited by pain Patient left: in chair;with call bell/phone within  reach Nurse Communication: Mobility status;Other (comment)(increased pain level) PT Visit Diagnosis: Difficulty in walking, not elsewhere classified (R26.2)     Time: 4782-9562 PT Time Calculation (min) (ACUTE ONLY): 27 min  Charges:  $Gait Training: 8-22 mins $Therapeutic Exercise: 8-22 mins $Therapeutic Activity: 8-22 mins                    G Codes:       Pg [phone]    Nusayba Cadenas 12/25/2017, 10:44 AM

## 2018-02-14 DIAGNOSIS — M51369 Other intervertebral disc degeneration, lumbar region without mention of lumbar back pain or lower extremity pain: Secondary | ICD-10-CM | POA: Insufficient documentation

## 2018-02-14 DIAGNOSIS — G894 Chronic pain syndrome: Secondary | ICD-10-CM | POA: Insufficient documentation

## 2018-02-14 DIAGNOSIS — M1710 Unilateral primary osteoarthritis, unspecified knee: Secondary | ICD-10-CM | POA: Insufficient documentation

## 2018-02-14 DIAGNOSIS — G8929 Other chronic pain: Secondary | ICD-10-CM | POA: Insufficient documentation

## 2018-02-16 ENCOUNTER — Ambulatory Visit (INDEPENDENT_AMBULATORY_CARE_PROVIDER_SITE_OTHER): Payer: Worker's Compensation | Admitting: Physician Assistant

## 2018-02-16 ENCOUNTER — Other Ambulatory Visit: Payer: Self-pay

## 2018-02-16 ENCOUNTER — Encounter (INDEPENDENT_AMBULATORY_CARE_PROVIDER_SITE_OTHER): Payer: Self-pay | Admitting: Physician Assistant

## 2018-02-16 VITALS — BP 126/99 | HR 76 | Temp 98.5°F | Ht 66.14 in | Wt 203.0 lb

## 2018-02-16 DIAGNOSIS — N3281 Overactive bladder: Secondary | ICD-10-CM

## 2018-02-16 DIAGNOSIS — I10 Essential (primary) hypertension: Secondary | ICD-10-CM

## 2018-02-16 DIAGNOSIS — K219 Gastro-esophageal reflux disease without esophagitis: Secondary | ICD-10-CM

## 2018-02-16 DIAGNOSIS — M545 Low back pain, unspecified: Secondary | ICD-10-CM

## 2018-02-16 DIAGNOSIS — M25561 Pain in right knee: Secondary | ICD-10-CM

## 2018-02-16 DIAGNOSIS — G8929 Other chronic pain: Secondary | ICD-10-CM

## 2018-02-16 MED ORDER — ACETAMINOPHEN-CODEINE #3 300-30 MG PO TABS: 1.0000 | ORAL_TABLET | Freq: Three times a day (TID) | ORAL | 0 refills | Status: AC | PRN

## 2018-02-16 MED ORDER — METOPROLOL TARTRATE 100 MG PO TABS: 100.0000 mg | ORAL_TABLET | Freq: Two times a day (BID) | ORAL | 3 refills | Status: DC

## 2018-02-16 MED ORDER — ESOMEPRAZOLE MAGNESIUM 40 MG PO PACK: 40.0000 mg | PACK | Freq: Every day | ORAL | 3 refills | Status: DC

## 2018-02-16 MED ORDER — AMLODIPINE BESYLATE 5 MG PO TABS: 5.0000 mg | ORAL_TABLET | Freq: Every day | ORAL | 3 refills | Status: DC

## 2018-02-16 MED ORDER — DIPHENHYDRAMINE HCL 25 MG PO TABS: 25.0000 mg | ORAL_TABLET | Freq: Four times a day (QID) | ORAL | 0 refills | Status: DC | PRN

## 2018-02-16 MED ORDER — AMITRIPTYLINE HCL 25 MG PO TABS: 25.0000 mg | ORAL_TABLET | Freq: Every day | ORAL | 2 refills | Status: DC

## 2018-02-16 MED FILL — AMITRIPTYLINE HCL 25 MG TAB: 25 | 30 days supply | Qty: 30 | Fill #0

## 2018-02-16 MED FILL — ACETAMINOPHEN/COD #3 TABLET: 300-30 | 7 days supply | Qty: 21 | Fill #0

## 2018-02-16 MED FILL — ESOMEPRAZOLE MAG DR 40 MG C: 40 | 30 days supply | Qty: 30 | Fill #0

## 2018-02-16 MED FILL — METOPROLOL TARTRATE 100 MG: 100 | 30 days supply | Qty: 60 | Fill #0

## 2018-02-16 MED FILL — AMLODIPINE BESYLATE 5 MG TA: 5 | 30 days supply | Qty: 30 | Fill #0

## 2018-02-16 NOTE — Patient Instructions (Signed)
Managing Your Hypertension Hypertension is commonly called high blood pressure. This is when the force of your blood pressing against the walls of your arteries is too strong. Arteries are blood vessels that carry blood from your heart throughout your body. Hypertension forces the heart to work harder to pump blood, and may cause the arteries to become narrow or stiff. Having untreated or uncontrolled hypertension can cause heart attack, stroke, kidney disease, and other problems. What are blood pressure readings? A blood pressure reading consists of a higher number over a lower number. Ideally, your blood pressure should be below 120/80. The first ("top") number is called the systolic pressure. It is a measure of the pressure in your arteries as your heart beats. The second ("bottom") number is called the diastolic pressure. It is a measure of the pressure in your arteries as the heart relaxes. What does my blood pressure reading mean? Blood pressure is classified into four stages. Based on your blood pressure reading, your health care provider may use the following stages to determine what type of treatment you need, if any. Systolic pressure and diastolic pressure are measured in a unit called mm Hg. Normal  Systolic pressure: below 120.  Diastolic pressure: below 80. Elevated  Systolic pressure: 120-129.  Diastolic pressure: below 80. Hypertension stage 1  Systolic pressure: 130-139.  Diastolic pressure: 80-89. Hypertension stage 2  Systolic pressure: 140 or above.  Diastolic pressure: 90 or above. What health risks are associated with hypertension? Managing your hypertension is an important responsibility. Uncontrolled hypertension can lead to:  A heart attack.  A stroke.  A weakened blood vessel (aneurysm).  Heart failure.  Kidney damage.  Eye damage.  Metabolic syndrome.  Memory and concentration problems.  What changes can I make to manage my  hypertension? Hypertension can be managed by making lifestyle changes and possibly by taking medicines. Your health care provider will help you make a plan to bring your blood pressure within a normal range. Eating and drinking  Eat a diet that is high in fiber and potassium, and low in salt (sodium), added sugar, and fat. An example eating plan is called the DASH (Dietary Approaches to Stop Hypertension) diet. To eat this way: ? Eat plenty of fresh fruits and vegetables. Try to fill half of your plate at each meal with fruits and vegetables. ? Eat whole grains, such as whole wheat pasta, brown rice, or whole grain bread. Fill about one quarter of your plate with whole grains. ? Eat low-fat diary products. ? Avoid fatty cuts of meat, processed or cured meats, and poultry with skin. Fill about one quarter of your plate with lean proteins such as fish, chicken without skin, beans, eggs, and tofu. ? Avoid premade and processed foods. These tend to be higher in sodium, added sugar, and fat.  Reduce your daily sodium intake. Most people with hypertension should eat less than 1,500 mg of sodium a day.  Limit alcohol intake to no more than 1 drink a day for nonpregnant women and 2 drinks a day for men. One drink equals 12 oz of beer, 5 oz of wine, or 1 oz of hard liquor. Lifestyle  Work with your health care provider to maintain a healthy body weight, or to lose weight. Ask what an ideal weight is for you.  Get at least 30 minutes of exercise that causes your heart to beat faster (aerobic exercise) most days of the week. Activities may include walking, swimming, or biking.  Include exercise   to strengthen your muscles (resistance exercise), such as weight lifting, as part of your weekly exercise routine. Try to do these types of exercises for 30 minutes at least 3 days a week.  Do not use any products that contain nicotine or tobacco, such as cigarettes and e-cigarettes. If you need help quitting, ask  your health care provider.  Control any long-term (chronic) conditions you have, such as high cholesterol or diabetes. Monitoring  Monitor your blood pressure at home as told by your health care provider. Your personal target blood pressure may vary depending on your medical conditions, your age, and other factors.  Have your blood pressure checked regularly, as often as told by your health care provider. Working with your health care provider  Review all the medicines you take with your health care provider because there may be side effects or interactions.  Talk with your health care provider about your diet, exercise habits, and other lifestyle factors that may be contributing to hypertension.  Visit your health care provider regularly. Your health care provider can help you create and adjust your plan for managing hypertension. Will I need medicine to control my blood pressure? Your health care provider may prescribe medicine if lifestyle changes are not enough to get your blood pressure under control, and if:  Your systolic blood pressure is 130 or higher.  Your diastolic blood pressure is 80 or higher.  Take medicines only as told by your health care provider. Follow the directions carefully. Blood pressure medicines must be taken as prescribed. The medicine does not work as well when you skip doses. Skipping doses also puts you at risk for problems. Contact a health care provider if:  You think you are having a reaction to medicines you have taken.  You have repeated (recurrent) headaches.  You feel dizzy.  You have swelling in your ankles.  You have trouble with your vision. Get help right away if:  You develop a severe headache or confusion.  You have unusual weakness or numbness, or you feel faint.  You have severe pain in your chest or abdomen.  You vomit repeatedly.  You have trouble breathing. Summary  Hypertension is when the force of blood pumping through  your arteries is too strong. If this condition is not controlled, it may put you at risk for serious complications.  Your personal target blood pressure may vary depending on your medical conditions, your age, and other factors. For most people, a normal blood pressure is less than 120/80.  Hypertension is managed by lifestyle changes, medicines, or both. Lifestyle changes include weight loss, eating a healthy, low-sodium diet, exercising more, and limiting alcohol. This information is not intended to replace advice given to you by your health care provider. Make sure you discuss any questions you have with your health care provider. Document Released: 08/24/2012 Document Revised: 10/28/2016 Document Reviewed: 10/28/2016 Elsevier Interactive Patient Education  2018 [employer]  

## 2018-02-16 NOTE — Progress Notes (Signed)
New pt has uncontrolled HTN, only has metoprolol has been doubling dosage which she feels will prevent blood pressure from elevating and prevent her from possibly having a stroke. Pt states he mother passed away at 49 from constant elevated BP that caused an aneurysm.

## 2018-02-16 NOTE — Progress Notes (Signed)
Subjective:  Patient ID: Amber Meyer, female    DOB: 1956-09-18  Age: 62 y.o. MRN: [id number]  CC: HTN, GERD, Overactive bladder  HPI Amber Meyer is a 62 y.o. female with a medical history of HTN, GERD, chronic back pain (relegated to pain clinic by ortho), kidney stones, s/p total right knee replacement, and partial hysterectomy presents as a new patient to discuss multiple complaints.     In regards to back pain, patient had several injections to the back which failed. Two orthopedic specialists recommended against surgery. Continues with LBP. Also has chronic  right knee pain after her total knee replacement approximately two months ago.      In regards to OAB, patient to [facility] but ran out three months ago. Urinates quite frequently throughout the day and night without Myrbetriq. Myrbetriq helps drastically reduce frequency of urination to " three times per day and two times at night". Reportedly never tried any other medications before Myrbetriq.  Outpatient Medications Prior to Visit  Medication Sig Dispense Refill  . albuterol (PROVENTIL HFA;VENTOLIN HFA) 108 (90 BASE) MCG/ACT inhaler Inhale 2 puffs into the lungs every 4 (four) hours as needed for wheezing. 1 Inhaler 12  . metoprolol (LOPRESSOR) 100 MG tablet Take 100 mg by mouth at bedtime.     . Multiple Vitamins-Calcium (ONE-A-DAY WOMENS PO) Take 1 tablet by mouth daily.    . Amlodipine-Valsartan-HCTZ 5-160-25 MG TABS Take 1 tablet by mouth daily.  5  . apixaban (ELIQUIS) 2.5 MG TABS tablet Take 1 tablet (2.5 mg total) by mouth 2 (two) times daily. (Patient not taking: Reported on 02/16/2018) 60 tablet 0  . chlorpheniramine-HYDROcodone (TUSSIONEX PENNKINETIC ER) 10-8 MG/5ML LQCR Take 5 mLs by mouth every 12 (twelve) hours as needed. (Patient not taking: Reported on 01/16/2015) 60 mL 0  . clonazePAM (KLONOPIN) 0.5 MG tablet Take 1 tablet (0.5 mg total) by mouth 2 (two) times daily as needed for anxiety. (Patient not  taking: Reported on 12/08/2017) 15 tablet 0  . co-enzyme Q-10 30 MG capsule Take 30 mg by mouth daily.    Marland Kitchen Dexlansoprazole (DEXILANT) 30 MG capsule Take 30 mg by mouth daily.     Marland Kitchen docusate sodium (COLACE) 100 MG capsule Take 1 capsule (100 mg total) by mouth 2 (two) times daily. (Patient not taking: Reported on 02/16/2018) 60 capsule 1  . Menthol-Methyl Salicylate (THERA-GESIC EX) Apply 1 application topically as needed (for pain).    . MYRBETRIQ 50 MG TB24 tablet Take 50 mg by mouth daily.     . ondansetron (ZOFRAN) 4 MG tablet Take 1 tablet (4 mg total) by mouth every 6 (six) hours as needed for nausea. (Patient not taking: Reported on 02/16/2018) 20 tablet 0  . oxyCODONE (OXY IR/ROXICODONE) 5 MG immediate release tablet Take 1 tablet (5 mg total) by mouth every 4 (four) hours as needed (knee pain). (Patient not taking: Reported on 02/16/2018) 60 tablet 0  . senna (SENOKOT) 8.6 MG TABS tablet Take 2 tablets (17.2 mg total) by mouth at bedtime. (Patient not taking: Reported on 02/16/2018) 120 each 0   No facility-administered medications prior to visit.      ROS Review of Systems  Constitutional: Negative for chills, fever and malaise/fatigue.  Eyes: Negative for blurred vision.  Respiratory: Negative for shortness of breath.   Cardiovascular: Negative for chest pain and palpitations.  Gastrointestinal: Negative for abdominal pain and nausea.  Genitourinary: Positive for frequency. Negative for dysuria and hematuria.  Musculoskeletal: Positive for back  pain and joint pain. Negative for myalgias.  Skin: Negative for rash.  Neurological: Negative for tingling and headaches.  Psychiatric/Behavioral: Negative for depression. The patient is not nervous/anxious.     Objective:  BP (!) 126/99 (BP Location: Left Arm, Patient Position: Sitting, Cuff Size: Large)   Pulse 76   Temp 98.5 F (36.9 C) (Oral)   Ht 5' 6.14" (1.68 m)   Wt 203 lb (92.1 kg)   SpO2 97%   BMI 32.62 kg/m   BP/Weight  02/16/2018 12/25/2017 06/28/9677  Systolic BP 938 101 -  Diastolic BP 99 73 -  Wt. (Lbs) 203 - 209  BMI 32.62 - 32.73      Physical Exam  Constitutional: She is oriented to person, place, and time.  Well developed, obese, NAD, polite  HENT:  Head: Normocephalic and atraumatic.  Eyes: No scleral icterus.  Neck: Normal range of motion. Neck supple. No thyromegaly present.  Cardiovascular: Normal rate, regular rhythm and normal heart sounds.  Pulmonary/Chest: Effort normal and breath sounds normal.  Abdominal: Soft. Bowel sounds are normal. There is no tenderness.  Genitourinary:  Genitourinary Comments: Mild suprapubic TTP  Musculoskeletal: She exhibits no edema.  Back not properly assessed during this visit  Neurological: She is alert and oriented to person, place, and time.  Skin: Skin is warm and dry. No rash noted. No erythema. No pallor.  Psychiatric: She has a normal mood and affect. Her behavior is normal. Thought content normal.  Vitals reviewed.    Assessment & Plan:    1. Hypertension, unspecified type - amLODipine (NORVASC) 5 MG tablet; Take 1 tablet (5 mg total) by mouth daily.  Dispense: 90 tablet; Refill: 3 - metoprolol tartrate (LOPRESSOR) 100 MG tablet; Take 1 tablet (100 mg total) by mouth 2 (two) times daily.  Dispense: 180 tablet; Refill: 3 - Comprehensive metabolic panel - CBC with Differential - TSH - Lipid panel  2. Chronic midline low back pain without sciatica - acetaminophen-codeine (TYLENOL #3) 300-30 MG tablet; Take 1 tablet by mouth every 8 (eight) hours as needed for up to 7 days for moderate pain.  Dispense: 90 tablet; Refill: 0 - diphenhydrAMINE (BENADRYL ALLERGY) 25 MG tablet; Take 1 tablet (25 mg total) by mouth every 6 (six) hours as needed.  Dispense: 30 tablet; Refill: 0  3. Chronic pain of right knee - acetaminophen-codeine (TYLENOL #3) 300-30 MG tablet; Take 1 tablet by mouth every 8 (eight) hours as needed for up to 7 days for moderate  pain.  Dispense: 90 tablet; Refill: 0 - diphenhydrAMINE (BENADRYL ALLERGY) 25 MG tablet; Take 1 tablet (25 mg total) by mouth every 6 (six) hours as needed.  Dispense: 30 tablet; Refill: 0  4. Overactive bladder - amitriptyline (ELAVIL) 25 MG tablet; Take 1 tablet (25 mg total) by mouth at bedtime.  Dispense: 30 tablet; Refill: 2  5. Gastroesophageal reflux disease without esophagitis - esomeprazole (NEXIUM) 40 MG packet; Take 40 mg by mouth daily before breakfast.  Dispense: 90 each; Refill: 3   Meds ordered this encounter  Medications  . esomeprazole (NEXIUM) 40 MG packet    Sig: Take 40 mg by mouth daily before breakfast.    Dispense:  90 each    Refill:  3    Order Specific Question:   Supervising Provider    Answer:   Tresa Garter [id number]  . amLODipine (NORVASC) 5 MG tablet    Sig: Take 1 tablet (5 mg total) by mouth daily.    Dispense:  90 tablet    Refill:  3    Order Specific Question:   Supervising Provider    Answer:   Tresa Garter [id number]  . acetaminophen-codeine (TYLENOL #3) 300-30 MG tablet    Sig: Take 1 tablet by mouth every 8 (eight) hours as needed for up to 7 days for moderate pain.    Dispense:  90 tablet    Refill:  0    Order Specific Question:   Supervising Provider    Answer:   Tresa Garter [id number]  . amitriptyline (ELAVIL) 25 MG tablet    Sig: Take 1 tablet (25 mg total) by mouth at bedtime.    Dispense:  30 tablet    Refill:  2    Order Specific Question:   Supervising Provider    Answer:   Tresa Garter [id number]  . diphenhydrAMINE (BENADRYL ALLERGY) 25 MG tablet    Sig: Take 1 tablet (25 mg total) by mouth every 6 (six) hours as needed.    Dispense:  30 tablet    Refill:  0    Order Specific Question:   Supervising Provider    Answer:   Tresa Garter [id number]  . metoprolol tartrate (LOPRESSOR) 100 MG tablet    Sig: Take 1 tablet (100 mg total) by mouth 2 (two) times daily.    Dispense:  180 tablet     Refill:  3    Order Specific Question:   Supervising Provider    Answer:   Tresa Garter [id number]    Follow-up: Return for back pain.   Clent Demark PA

## 2018-02-17 ENCOUNTER — Telehealth (INDEPENDENT_AMBULATORY_CARE_PROVIDER_SITE_OTHER): Payer: Self-pay

## 2018-02-17 LAB — LIPID PANEL
Chol/HDL Ratio: 3.7 ratio (ref 0.0–4.4)
Cholesterol, Total: 164 mg/dL (ref 100–199)
HDL: 44 mg/dL (ref >39)
LDL Calculated: 92 mg/dL (ref 0–99)
Triglycerides: 140 mg/dL (ref 0–149)
VLDL Cholesterol Cal: 28 mg/dL (ref 5–40)

## 2018-02-17 LAB — CBC WITH DIFFERENTIAL/PLATELET
Basophils Absolute: 0 10*3/uL (ref 0.0–0.2)
Basos: 0 %
EOS (ABSOLUTE): 0.2 10*3/uL (ref 0.0–0.4)
Eos: 2 %
Hematocrit: 42.7 % (ref 34.0–46.6)
Hemoglobin: 13.9 g/dL (ref 11.1–15.9)
Immature Grans (Abs): 0 10*3/uL (ref 0.0–0.1)
Immature Granulocytes: 0 %
Lymphocytes Absolute: 2.8 10*3/uL (ref 0.7–3.1)
Lymphs: 35 %
MCH: 28.7 pg (ref 26.6–33.0)
MCHC: 32.6 g/dL (ref 31.5–35.7)
MCV: 88 fL (ref 79–97)
Monocytes Absolute: 0.6 10*3/uL (ref 0.1–0.9)
Monocytes: 8 %
Neutrophils Absolute: 4.4 10*3/uL (ref 1.4–7.0)
Neutrophils: 55 %
Platelets: 356 10*3/uL (ref 150–379)
RBC: 4.85 x10E6/uL (ref 3.77–5.28)
RDW: 14.6 % (ref 12.3–15.4)
WBC: 7.9 10*3/uL (ref 3.4–10.8)

## 2018-02-17 LAB — COMPREHENSIVE METABOLIC PANEL
ALT: 6 IU/L (ref 0–32)
AST: 13 IU/L (ref 0–40)
Albumin/Globulin Ratio: 1.7 (ref 1.2–2.2)
Albumin: 4.5 g/dL (ref 3.6–4.8)
Alkaline Phosphatase: 141 IU/L — ABNORMAL HIGH (ref 39–117)
BUN/Creatinine Ratio: 11 — ABNORMAL LOW (ref 12–28)
BUN: 10 mg/dL (ref 8–27)
Bilirubin Total: 0.3 mg/dL (ref 0.0–1.2)
CO2: 26 mmol/L (ref 20–29)
Calcium: 10.4 mg/dL — ABNORMAL HIGH (ref 8.7–10.3)
Chloride: 101 mmol/L (ref 96–106)
Creatinine, Ser: 0.93 mg/dL (ref 0.57–1.00)
GFR calc Af Amer: 76 mL/min/{1.73_m2} (ref >59)
GFR calc non Af Amer: 66 mL/min/{1.73_m2} (ref >59)
Globulin, Total: 2.7 g/dL (ref 1.5–4.5)
Glucose: 97 mg/dL (ref 65–99)
Potassium: 4.2 mmol/L (ref 3.5–5.2)
Sodium: 142 mmol/L (ref 134–144)
Total Protein: 7.2 g/dL (ref 6.0–8.5)

## 2018-02-17 LAB — TSH: TSH: 0.597 u[IU]/mL (ref 0.450–4.500)

## 2018-02-17 NOTE — Telephone Encounter (Signed)
Patient is aware of labs being normal except for elevated calcium and alk phos. Patient has scheduled to return on march 27 for additional work up. Nat Christen, CMA

## 2018-02-17 NOTE — Telephone Encounter (Signed)
-----   Message from Clent Demark, PA-C sent at 02/17/2018  8:39 AM EST ----- Results normal except for a slight increase in Calcium and a moderate increase in Alkaline Phosphatase which is an enzyme found mostly in liver an bones. Patient should make a f/u appt 2-4 weeks to work up further.

## 2018-03-09 ENCOUNTER — Ambulatory Visit (INDEPENDENT_AMBULATORY_CARE_PROVIDER_SITE_OTHER): Payer: Self-pay | Admitting: Physician Assistant

## 2018-03-17 MED FILL — ACETAMINOPHEN/COD #3 TABLET: 300-30 | 7 days supply | Qty: 21 | Fill #1

## 2018-03-17 MED FILL — ESOMEPRAZOLE MAG DR 40 MG C: 40 | 30 days supply | Qty: 30 | Fill #1

## 2018-03-17 MED FILL — AMITRIPTYLINE HCL 25 MG TAB: 25 | 30 days supply | Qty: 30 | Fill #1

## 2018-03-17 MED FILL — AMLODIPINE BESYLATE 5 MG TA: 5 | 30 days supply | Qty: 30 | Fill #1

## 2018-03-22 ENCOUNTER — Ambulatory Visit (INDEPENDENT_AMBULATORY_CARE_PROVIDER_SITE_OTHER): Payer: Self-pay | Admitting: Physician Assistant

## 2018-04-21 MED FILL — ACETAMINOPHEN/COD #3 TABLET: 300-30 | 7 days supply | Qty: 21 | Fill #2

## 2018-04-21 MED FILL — AMLODIPINE BESYLATE 5 MG TA: 5 | 30 days supply | Qty: 30 | Fill #2

## 2018-04-21 MED FILL — AMITRIPTYLINE HCL 25 MG TAB: 25 | 30 days supply | Qty: 30 | Fill #2

## 2018-04-21 MED FILL — ESOMEPRAZOLE MAG DR 40 MG C: 40 | 30 days supply | Qty: 30 | Fill #2

## 2018-06-02 MED FILL — METOPROLOL TARTRATE 100 MG: 100 | 30 days supply | Qty: 60 | Fill #1

## 2018-06-02 MED FILL — AMLODIPINE BESYLATE 5 MG TA: 5 | 30 days supply | Qty: 30 | Fill #3

## 2018-06-02 MED FILL — ACETAMINOPHEN/COD #3 TABLET: 300-30 | 7 days supply | Qty: 21 | Fill #3

## 2018-06-02 MED FILL — ESOMEPRAZOLE MAG DR 40 MG C: 40 | 30 days supply | Qty: 30 | Fill #3

## 2018-06-29 DIAGNOSIS — Z96651 Presence of right artificial knee joint: Secondary | ICD-10-CM | POA: Insufficient documentation

## 2018-07-05 ENCOUNTER — Other Ambulatory Visit (INDEPENDENT_AMBULATORY_CARE_PROVIDER_SITE_OTHER): Payer: Self-pay | Admitting: Physician Assistant

## 2018-07-05 DIAGNOSIS — N3281 Overactive bladder: Secondary | ICD-10-CM

## 2018-07-05 MED FILL — METOPROLOL TARTRATE 100 MG: 100 | 30 days supply | Qty: 60 | Fill #2

## 2018-07-05 MED FILL — AMITRIPTYLINE HCL 25 MG TAB: 25 | 30 days supply | Qty: 30 | Fill #0

## 2018-07-05 MED FILL — AMLODIPINE BESYLATE 5 MG TA: 5 | 30 days supply | Qty: 30 | Fill #4

## 2018-07-05 MED FILL — ESOMEPRAZOLE MAGNESIUM 40 M: 40 | 30 days supply | Qty: 30 | Fill #4

## 2018-07-05 NOTE — Telephone Encounter (Signed)
FWD to PCP. Tempestt S Roberts, CMA  

## 2018-08-09 MED FILL — AMITRIPTYLINE HCL 25 MG TAB: 25 | 30 days supply | Qty: 30 | Fill #1

## 2018-08-09 MED FILL — ESOMEPRAZOLE MAGNESIUM 40 M: 40 | 30 days supply | Qty: 30 | Fill #5

## 2018-08-09 MED FILL — AMLODIPINE BESYLATE 5 MG TA: 5 | 30 days supply | Qty: 30 | Fill #5

## 2018-09-09 ENCOUNTER — Other Ambulatory Visit (INDEPENDENT_AMBULATORY_CARE_PROVIDER_SITE_OTHER): Payer: Self-pay | Admitting: Physician Assistant

## 2018-09-09 MED FILL — METOPROLOL TARTRATE 100 MG: 100 | 30 days supply | Qty: 60 | Fill #3

## 2018-09-09 MED FILL — ESOMEPRAZOLE MAGNESIUM 40 M: 40 | 30 days supply | Qty: 30 | Fill #6

## 2018-09-09 MED FILL — AMLODIPINE BESYLATE 5 MG TA: 5 | 30 days supply | Qty: 30 | Fill #6

## 2018-09-09 MED FILL — AMITRIPTYLINE HCL 25 MG TAB: 25 | 30 days supply | Qty: 30 | Fill #2

## 2018-09-09 NOTE — Telephone Encounter (Signed)
FWD to PCP. Tempestt S Roberts, CMA  

## 2019-01-23 MED FILL — AMLODIPINE BESYLATE 5 MG TA: 5 | 30 days supply | Qty: 30 | Fill #7

## 2019-01-27 MED FILL — METOPROLOL TARTRATE 100 MG: 100 | 30 days supply | Qty: 60 | Fill #4

## 2019-04-04 ENCOUNTER — Ambulatory Visit: Payer: Self-pay | Attending: Primary Care | Admitting: Primary Care

## 2019-04-04 ENCOUNTER — Other Ambulatory Visit: Payer: Self-pay

## 2019-04-04 ENCOUNTER — Encounter: Payer: Self-pay | Admitting: Primary Care

## 2019-04-04 VITALS — BP 169/112 | HR 127 | Temp 98.8°F

## 2019-04-04 DIAGNOSIS — E669 Obesity, unspecified: Secondary | ICD-10-CM

## 2019-04-04 DIAGNOSIS — F17210 Nicotine dependence, cigarettes, uncomplicated: Secondary | ICD-10-CM

## 2019-04-04 DIAGNOSIS — I10 Essential (primary) hypertension: Secondary | ICD-10-CM

## 2019-04-04 DIAGNOSIS — Z760 Encounter for issue of repeat prescription: Secondary | ICD-10-CM

## 2019-04-04 DIAGNOSIS — F17200 Nicotine dependence, unspecified, uncomplicated: Secondary | ICD-10-CM

## 2019-04-04 DIAGNOSIS — N3281 Overactive bladder: Secondary | ICD-10-CM

## 2019-04-04 DIAGNOSIS — M1711 Unilateral primary osteoarthritis, right knee: Secondary | ICD-10-CM

## 2019-04-04 MED ORDER — AMLODIPINE BESYLATE 5 MG PO TABS: 10.0000 mg | ORAL_TABLET | Freq: Every day | ORAL | 0 refills | Status: DC

## 2019-04-04 MED ORDER — AMITRIPTYLINE HCL 25 MG PO TABS: 25.0000 mg | ORAL_TABLET | Freq: Every day | ORAL | 3 refills | Status: DC

## 2019-04-04 MED ORDER — METOPROLOL TARTRATE 100 MG PO TABS: 100.0000 mg | ORAL_TABLET | Freq: Two times a day (BID) | ORAL | 0 refills | Status: DC

## 2019-04-04 MED FILL — AMITRIPTYLINE HCL 25 MG TAB: 25 | 30 days supply | Qty: 30 | Fill #0

## 2019-04-04 MED FILL — METOPROLOL TARTRATE 100 MG: 100 | 30 days supply | Qty: 60 | Fill #0

## 2019-04-04 MED FILL — AMLODIPINE BESYLATE 5 MG TA: 5 | 30 days supply | Qty: 60 | Fill #0

## 2019-04-04 NOTE — Progress Notes (Signed)
Virtual Visit via Telephone Note  I connected with Amber Meyer on 04/04/19 at  2:30 PM EDT by telephone and verified that I am speaking with the correct person using two identifiers.   I discussed the limitations, risks, security and privacy concerns of performing an evaluation and management service by telephone and the availability of in person appointments. I also discussed with the patient that there may be a patient responsible charge related to this service. The patient expressed understanding and agreed to proceed.   History of Present Illness: Amber Meyer called for medication refilled and last office visit was 02/16/18. Past medical history of HTN, GERD, chronic back pain and  kidney stones, 2019 total right knee replacement, and partial hysterectomy.    In regards to back pain, patient had several injections to the back which failed. Two orthopedic specialists recommended against surgery. Continues with LBP. Also has chronic right knee pain resolved after her total knee replacement.      Observations/Objective: Review of Systems  Constitutional: Negative.   HENT: Negative.   Eyes: Negative.   Respiratory: Negative.   Cardiovascular: Negative.   Gastrointestinal: Negative.   Genitourinary: Negative.   Musculoskeletal:       S/p TKR Meyer 10,2019  Skin: Negative.   Neurological: Negative.   Endo/Heme/Allergies: Negative.   Psychiatric/Behavioral: Negative.     Assessment and Plan: Amber Meyer was seen today for establish care.  Diagnoses and all orders for this visit:  Essential hypertension Increased Norvasc to 10mg  QD, cont metoprolol tartrate 100mg  bid. Re ck Bp when she arrives to pick up medication.   -     CBC -     COMPLETE METABOLIC PANEL WITH GFR -     Lipid panel  Osteoarthritis of right knee, unspecified osteoarthritis type Work related surgery TKR completed 2019 followed by ortho  Tobacco dependence  Bp elevated at time of call pt reading was 195/110.  She is to continuing to smoke 1/2 ppd. Discussed risk AA, obesity , HTN stroke or MI  Medication refill All medication requested   Obesity (BMI 30-39.9) Encourage diet and exercise  -     Lipid panel    Follow Up Instructions:    I discussed the assessment and treatment plan with the patient. The patient was provided an opportunity to ask questions and all were answered. The patient agreed with the plan and demonstrated an understanding of the instructions.   The patient was advised to call back or seek an in-person evaluation if the symptoms worsen or if the condition fails to improve as anticipated.  I provided 20 minutes of non-face-to-face time during this encounter.   Kerin Perna, NP

## 2019-04-04 NOTE — Addendum Note (Signed)
Addended by: Trecia Rogers on: 04/04/2019 04:44 PM   Modules accepted: Orders

## 2019-04-04 NOTE — Progress Notes (Signed)
Patient verified DOB Patient has taken OTC BAYER. Patient has eaten today. Patient denies pain. Refill are needed for inhaler, amitriptyline, amlodipine, Nexium, metoprolol 195/110 HR 120

## 2019-04-05 ENCOUNTER — Other Ambulatory Visit: Payer: Self-pay | Admitting: Primary Care

## 2019-04-05 LAB — COMPREHENSIVE METABOLIC PANEL
ALT: 16 IU/L (ref 0–32)
AST: 20 IU/L (ref 0–40)
Albumin/Globulin Ratio: 1.8 (ref 1.2–2.2)
Albumin: 4.6 g/dL (ref 3.8–4.8)
Alkaline Phosphatase: 161 IU/L — ABNORMAL HIGH (ref 39–117)
BUN/Creatinine Ratio: 18 (ref 12–28)
BUN: 16 mg/dL (ref 8–27)
Bilirubin Total: 0.2 mg/dL (ref 0.0–1.2)
CO2: 20 mmol/L (ref 20–29)
Calcium: 10.2 mg/dL (ref 8.7–10.3)
Chloride: 105 mmol/L (ref 96–106)
Creatinine, Ser: 0.9 mg/dL (ref 0.57–1.00)
GFR calc Af Amer: 78 mL/min/{1.73_m2} (ref >59)
GFR calc non Af Amer: 68 mL/min/{1.73_m2} (ref >59)
Globulin, Total: 2.6 g/dL (ref 1.5–4.5)
Glucose: 125 mg/dL — ABNORMAL HIGH (ref 65–99)
Potassium: 3.8 mmol/L (ref 3.5–5.2)
Sodium: 143 mmol/L (ref 134–144)
Total Protein: 7.2 g/dL (ref 6.0–8.5)

## 2019-04-05 LAB — LIPID PANEL
Chol/HDL Ratio: 4.5 ratio — ABNORMAL HIGH (ref 0.0–4.4)
Cholesterol, Total: 196 mg/dL (ref 100–199)
HDL: 44 mg/dL (ref >39)
LDL Calculated: 94 mg/dL (ref 0–99)
Triglycerides: 288 mg/dL — ABNORMAL HIGH (ref 0–149)
VLDL Cholesterol Cal: 58 mg/dL — ABNORMAL HIGH (ref 5–40)

## 2019-04-05 LAB — CBC
Hematocrit: 43.6 % (ref 34.0–46.6)
Hemoglobin: 14.4 g/dL (ref 11.1–15.9)
MCH: 28.5 pg (ref 26.6–33.0)
MCHC: 33 g/dL (ref 31.5–35.7)
MCV: 86 fL (ref 79–97)
Platelets: 346 10*3/uL (ref 150–450)
RBC: 5.05 x10E6/uL (ref 3.77–5.28)
RDW: 14.4 % (ref 11.7–15.4)
WBC: 10.8 10*3/uL (ref 3.4–10.8)

## 2019-04-05 MED ORDER — ATORVASTATIN CALCIUM 10 MG PO TABS: 40.0000 mg | ORAL_TABLET | Freq: Every day | ORAL | 0 refills | Status: DC

## 2019-04-10 ENCOUNTER — Telehealth: Payer: Self-pay | Admitting: *Deleted

## 2019-04-10 NOTE — Telephone Encounter (Signed)
-----   Message from Kerin Perna, NP sent at 04/05/2019  4:13 PM EDT ----- Elevated TG and VLDL 58 with increase risk for stroke . Other labs wnl

## 2019-04-10 NOTE — Telephone Encounter (Signed)
Patient verified DOB Patient is aware of of cholesterol being elevated and needing to take atorvastatin and limit fried foods. Patient had no questions

## 2019-04-18 ENCOUNTER — Other Ambulatory Visit: Payer: Self-pay

## 2019-04-18 ENCOUNTER — Ambulatory Visit: Payer: Self-pay | Attending: Family Medicine | Admitting: Primary Care

## 2019-04-18 ENCOUNTER — Ambulatory Visit (INDEPENDENT_AMBULATORY_CARE_PROVIDER_SITE_OTHER): Payer: Self-pay

## 2019-04-18 VITALS — BP 142/87 | HR 79 | Resp 16

## 2019-04-18 DIAGNOSIS — I10 Essential (primary) hypertension: Secondary | ICD-10-CM

## 2019-04-18 DIAGNOSIS — Z720 Tobacco use: Secondary | ICD-10-CM

## 2019-04-18 DIAGNOSIS — J302 Other seasonal allergic rhinitis: Secondary | ICD-10-CM

## 2019-04-18 MED ORDER — HYDROCHLOROTHIAZIDE 25 MG PO TABS: 25.0000 mg | ORAL_TABLET | Freq: Every day | ORAL | 0 refills | Status: DC

## 2019-04-18 MED ORDER — NICOTINE 21 MG/24HR TD PT24: 21.0000 mg | MEDICATED_PATCH | Freq: Every day | TRANSDERMAL | 0 refills | Status: DC

## 2019-04-18 MED ORDER — ALBUTEROL SULFATE HFA 108 (90 BASE) MCG/ACT IN AERS: 2.0000 | INHALATION_SPRAY | RESPIRATORY_TRACT | 12 refills | Status: DC | PRN

## 2019-04-18 MED ORDER — LORATADINE 10 MG PO TABS: 10.0000 mg | ORAL_TABLET | Freq: Every day | ORAL | 11 refills | Status: DC

## 2019-04-18 MED ORDER — NICOTINE 7 MG/24HR TD PT24: 7.0000 mg | MEDICATED_PATCH | Freq: Every day | TRANSDERMAL | 0 refills | Status: DC

## 2019-04-18 MED ORDER — NICOTINE 14 MG/24HR TD PT24: 14.0000 mg | MEDICATED_PATCH | Freq: Every day | TRANSDERMAL | 0 refills | Status: DC

## 2019-04-18 MED FILL — !VENTOLIN HFA INHALER: 108 (90 BAS | 16 days supply | Qty: 18 | Fill #0

## 2019-04-18 MED FILL — LORATADINE 10 MG TABLET: 10 | 30 days supply | Qty: 30 | Fill #0

## 2019-04-18 MED FILL — HYDROCHLOROTHIAZIDE 25 MG T: 25 | 30 days supply | Qty: 30 | Fill #0

## 2019-04-18 MED FILL — NICOTINE 21 MG/24HR PATCH: 21 | 28 days supply | Qty: 28 | Fill #0

## 2019-04-18 NOTE — Patient Instructions (Signed)
Coping with Quitting Smoking  Quitting smoking is a physical and mental challenge. You will face cravings, withdrawal symptoms, and temptation. Before quitting, work with your health care provider to make a plan that can help you cope. Preparation can help you quit and keep you from giving in. How can I cope with cravings? Cravings usually last for 5-10 minutes. If you get through it, the craving will pass. Consider taking the following actions to help you cope with cravings:  Keep your mouth busy: ? Chew sugar-free gum. ? Suck on hard candies or a straw. ? Brush your teeth.  Keep your hands and body busy: ? Immediately change to a different activity when you feel a craving. ? Squeeze or play with a ball. ? Do an activity or a hobby, like making bead jewelry, practicing needlepoint, or working with wood. ? Mix up your normal routine. ? Take a short exercise break. Go for a quick walk or run up and down stairs. ? Spend time in public places where smoking is not allowed.  Focus on doing something kind or helpful for someone else.  Call a friend or family member to talk during a craving.  Join a support group.  Call a quit line, such as 1-800-QUIT-NOW.  Talk with your health care provider about medicines that might help you cope with cravings and make quitting easier for you. How can I deal with withdrawal symptoms? Your body may experience negative effects as it tries to get used to not having nicotine in the system. These effects are called withdrawal symptoms. They may include:  Feeling hungrier than normal.  Trouble concentrating.  Irritability.  Trouble sleeping.  Feeling depressed.  Restlessness and agitation.  Craving a cigarette. To manage withdrawal symptoms:  Avoid places, people, and activities that trigger your cravings.  Remember why you want to quit.  Get plenty of sleep.  Avoid coffee and other caffeinated drinks. These may worsen some of your symptoms.  How can I handle social situations? Social situations can be difficult when you are quitting smoking, especially in the first few weeks. To manage this, you can:  Avoid parties, bars, and other social situations where people might be smoking.  Avoid alcohol.  Leave right away if you have the urge to smoke.  Explain to your family and friends that you are quitting smoking. Ask for understanding and support.  Plan activities with friends or family where smoking is not an option. What are some ways I can cope with stress? Wanting to smoke may cause stress, and stress can make you want to smoke. Find ways to manage your stress. Relaxation techniques can help. For example:  Breathe slowly and deeply, in through your nose and out through your mouth.  Listen to soothing, relaxing music.  Talk with a family member or friend about your stress.  Light a candle.  Soak in a bath or take a shower.  Think about a peaceful place. What are some ways I can prevent weight gain? Be aware that many people gain weight after they quit smoking. However, not everyone does. To keep from gaining weight, have a plan in place before you quit and stick to the plan after you quit. Your plan should include:  Having healthy snacks. When you have a craving, it may help to: ? Eat plain popcorn, crunchy carrots, celery, or other cut vegetables. ? Chew sugar-free gum.  Changing how you eat: ? Eat small portion sizes at meals. ? Eat 4-6 small meals   throughout the day instead of 1-2 large meals a day. ? Be mindful when you eat. Do not watch television or do other things that might distract you as you eat.  Exercising regularly: ? Make time to exercise each day. If you do not have time for a long workout, do short bouts of exercise for 5-10 minutes several times a day. ? Do some form of strengthening exercise, like weight lifting, and some form of aerobic exercise, like running or swimming.  Drinking plenty of  water or other low-calorie or no-calorie drinks. Drink 6-8 glasses of water daily, or as much as instructed by your health care provider. Summary  Quitting smoking is a physical and mental challenge. You will face cravings, withdrawal symptoms, and temptation to smoke again. Preparation can help you as you go through these challenges.  You can cope with cravings by keeping your mouth busy (such as by chewing gum), keeping your body and hands busy, and making calls to family, friends, or a helpline for people who want to quit smoking.  You can cope with withdrawal symptoms by avoiding places where people smoke, avoiding drinks with caffeine, and getting plenty of rest.  Ask your health care provider about the different ways to prevent weight gain, avoid stress, and handle social situations. This information is not intended to replace advice given to you by your health care provider. Make sure you discuss any questions you have with your health care provider. Document Released: 11/27/2016 Document Revised: 11/27/2016 Document Reviewed: 11/27/2016 Elsevier Interactive Patient Education  2019 [employer]  

## 2019-04-18 NOTE — Progress Notes (Signed)
Acute Office Visit  Subjective:    Patient ID: Amber Meyer, female    DOB: 1956-10-18, 63 y.o.   MRN: [id number]  No chief complaint on file.   HPI Patient is in today for Bp follow up. Also, concern with bronchospasm with pollen and requesting a refill on albuterol.  Past Medical History:  Diagnosis Date  . GERD (gastroesophageal reflux disease)   . Hypertension   . Renal disorder    3 kidney stones  . Sinus bradycardia seen on cardiac monitor   . Urinary frequency     Past Surgical History:  Procedure Laterality Date  . ABDOMINAL HYSTERECTOMY  1981   partial  . Kidney stone removal Left 1999  . KNEE ARTHROPLASTY Right 12/23/2017   Procedure: RIGHT TOTAL KNEE ARTHROPLASTY WITH COMPUTER NAVIGATION;  Surgeon: Rod Can, MD;  Location: [facility];  Service: Orthopedics;  Laterality: Right;  NEEDS RNFA  . KNEE SURGERY Right 2013   cleaned out damaged tissue and arthritis  . LITHOTRIPSY  2003, 2006   x 2    Family History  Problem Relation Age of Onset  . Aneurysm Mother   . Alzheimer's disease Father     Social History   Socioeconomic History  . Marital status: Single    Spouse name: Not on file  . Number of children: Not on file  . Years of education: Not on file  . Highest education level: Not on file  Occupational History  . Not on file  Social Needs  . Financial resource strain: Not on file  . Food insecurity:    Worry: Not on file    Inability: Not on file  . Transportation needs:    Medical: Not on file    Non-medical: Not on file  Tobacco Use  . Smoking status: Current Every Day Smoker    Packs/day: 0.50    Types: Cigarettes  . Smokeless tobacco: Never Used  . Tobacco comment: down to 2 cigs a day   Substance and Sexual Activity  . Alcohol use: No  . Drug use: No  . Sexual activity: Not Currently  Lifestyle  . Physical activity:    Days per week: Not on file    Minutes per session: Not on file  . Stress: Not on file   Relationships  . Social connections:    Talks on phone: Not on file    Gets together: Not on file    Attends religious service: Not on file    Active member of club or organization: Not on file    Attends meetings of clubs or organizations: Not on file    Relationship status: Not on file  . Intimate partner violence:    Fear of current or ex partner: Not on file    Emotionally abused: Not on file    Physically abused: Not on file    Forced sexual activity: Not on file  Other Topics Concern  . Not on file  Social History Narrative  . Not on file    Outpatient Medications Prior to Visit  Medication Sig Dispense Refill  . amitriptyline (ELAVIL) 25 MG tablet Take 1 tablet (25 mg total) by mouth at bedtime. 30 tablet 3  . amLODipine (NORVASC) 5 MG tablet Take 2 tablets (10 mg total) by mouth daily. 90 tablet 0  . albuterol (PROVENTIL HFA;VENTOLIN HFA) 108 (90 BASE) MCG/ACT inhaler Inhale 2 puffs into the lungs every 4 (four) hours as needed for wheezing. (Patient not taking: Reported on  04/18/2019) 1 Inhaler 12  . atorvastatin (LIPITOR) 10 MG tablet Take 4 tablets (40 mg total) by mouth daily for 30 days. (Patient not taking: Reported on 04/18/2019) 90 tablet 0  . co-enzyme Q-10 30 MG capsule Take 30 mg by mouth daily.    . diphenhydrAMINE (BENADRYL ALLERGY) 25 MG tablet Take 1 tablet (25 mg total) by mouth every 6 (six) hours as needed. (Patient not taking: Reported on 04/04/2019) 30 tablet 0  . docusate sodium (COLACE) 100 MG capsule Take 1 capsule (100 mg total) by mouth 2 (two) times daily. (Patient not taking: Reported on 02/16/2018) 60 capsule 1  . esomeprazole (NEXIUM) 40 MG packet Take 40 mg by mouth daily before breakfast. 90 each 3  . metoprolol tartrate (LOPRESSOR) 100 MG tablet Take 1 tablet (100 mg total) by mouth 2 (two) times daily. 180 tablet 0  . Multiple Vitamins-Calcium (ONE-A-DAY WOMENS PO) Take 1 tablet by mouth daily.     No facility-administered medications prior to visit.      Allergies  Allergen Reactions  . Hydrocodone-Acetaminophen Hives and Other (See Comments)    Can take with Benadryl     Review of Systems  Constitutional: Negative.   HENT: Negative.   Eyes: Negative.   Respiratory: Positive for wheezing.   Cardiovascular: Negative.   Gastrointestinal: Negative.   Genitourinary: Negative.   Musculoskeletal: Negative.   Skin: Negative.        Objective:    Physical Exam  Constitutional: She appears well-developed and well-nourished.  HENT:  Head: Normocephalic and atraumatic.  Cardiovascular: Normal rate and regular rhythm.  Pulmonary/Chest: Breath sounds normal.  Abdominal: Soft. Bowel sounds are normal. She exhibits distension.  Musculoskeletal: Normal range of motion.  Neurological: She is alert.  Psychiatric: She has a normal mood and affect.    There were no vitals taken for this visit. Wt Readings from Last 3 Encounters:  02/16/18 203 lb (92.1 kg)  12/23/17 209 lb (94.8 kg)  12/17/17 209 lb (94.8 kg)    Health Maintenance Due  Topic Date Due  . Hepatitis C Screening  07/03/1956  . HIV Screening  12/07/1969  . TETANUS/TDAP  12/07/1973  . PAP SMEAR-Modifier  12/08/1975  . MAMMOGRAM  12/07/2004  . COLONOSCOPY  12/07/2004    There are no preventive care reminders to display for this patient.   Lab Results  Component Value Date   TSH 0.597 02/16/2018   Lab Results  Component Value Date   WBC 10.8 04/04/2019   HGB 14.4 04/04/2019   HCT 43.6 04/04/2019   MCV 86 04/04/2019   PLT 346 04/04/2019   Lab Results  Component Value Date   NA 143 04/04/2019   K 3.8 04/04/2019   CO2 20 04/04/2019   GLUCOSE 125 (H) 04/04/2019   BUN 16 04/04/2019   CREATININE 0.90 04/04/2019   BILITOT 0.2 04/04/2019   ALKPHOS 161 (H) 04/04/2019   AST 20 04/04/2019   ALT 16 04/04/2019   PROT 7.2 04/04/2019   ALBUMIN 4.6 04/04/2019   CALCIUM 10.2 04/04/2019   ANIONGAP 4 (L) 12/24/2017   Lab Results  Component Value Date   CHOL  196 04/04/2019   Lab Results  Component Value Date   HDL 44 04/04/2019   Lab Results  Component Value Date   LDLCALC 94 04/04/2019   Lab Results  Component Value Date   TRIG 288 (H) 04/04/2019   Lab Results  Component Value Date   CHOLHDL 4.5 (H) 04/04/2019   No results  found for: HGBA1C     Assessment & Plan:   Problem List Items Addressed This Visit    None    Visit Diagnoses    Essential hypertension    -  Primary   Relevant Medications   hydrochlorothiazide (HYDRODIURIL) 25 MG tablet    Diagnoses and all orders for this visit:  Essential hypertension Remains elevated added  -     hydrochlorothiazide (HYDRODIURIL) 25 MG tablet; Take 1 tablet (25 mg total) by mouth daily.  Seasonal allergies Pollen high and difficulty with seasonal allergies will add proair and antihistamine   Tobacco abuse Voiced wanted to stop will send in nicoderm patches     Meds ordered this encounter  Medications  . hydrochlorothiazide (HYDRODIURIL) 25 MG tablet    Sig: Take 1 tablet (25 mg total) by mouth daily.    Dispense:  90 tablet    Refill:  0     Kerin Perna, NP

## 2019-04-18 NOTE — Progress Notes (Signed)
Nurse visit Bp  142/87 HR 79 remains elevated added HCTZ 25mg  QAM rtn in 2 weeks . Nurse discussed low sodium intake

## 2019-04-18 NOTE — Progress Notes (Signed)
Pt arrived to [facility]. Pt alert and oriented and arrives in good spirits. At the last OV on April 127,2020 with Mrs. Edwards, Mrs. Headings's blood pressure was 169/112.    Pt denies chest pain, SOB, HA, dizziness, or blurred vision.  Verified medication. Pt states medication was taken this morning.  Blood pressure reading today is 142/87.   Mrs. Oletta Lamas added medication HCTZ to her regimen.

## 2019-05-01 MED FILL — AMLODIPINE BESYLATE 5 MG TA: 5 | 15 days supply | Qty: 30 | Fill #1

## 2019-05-01 MED FILL — METOPROLOL TARTRATE 100 MG: 100 | 30 days supply | Qty: 60 | Fill #1

## 2019-05-01 MED FILL — AMITRIPTYLINE HCL 25 MG TAB: 25 | 30 days supply | Qty: 30 | Fill #1

## 2019-05-02 ENCOUNTER — Ambulatory Visit: Payer: Self-pay | Attending: Family Medicine | Admitting: Pharmacist

## 2019-05-02 ENCOUNTER — Encounter: Payer: Self-pay | Admitting: Pharmacist

## 2019-05-02 ENCOUNTER — Other Ambulatory Visit: Payer: Self-pay

## 2019-05-02 VITALS — BP 113/73 | HR 75

## 2019-05-02 DIAGNOSIS — I10 Essential (primary) hypertension: Secondary | ICD-10-CM

## 2019-05-02 MED ORDER — ATORVASTATIN CALCIUM 20 MG PO TABS: 20.0000 mg | ORAL_TABLET | Freq: Every day | ORAL | 2 refills | Status: DC

## 2019-05-02 MED ORDER — ESOMEPRAZOLE MAGNESIUM 40 MG PO CPDR: 40.0000 mg | DELAYED_RELEASE_CAPSULE | Freq: Every day | ORAL | 2 refills | Status: DC

## 2019-05-02 MED FILL — ESOMEPRAZOLE MAG DR 40 MG C: 40 | 30 days supply | Qty: 30 | Fill #0

## 2019-05-02 MED FILL — ATORVASTATIN 20 MG TABLET: 20 | 30 days supply | Qty: 30 | Fill #0

## 2019-05-02 NOTE — Progress Notes (Signed)
   S:    PCP: Michelle  Patient arrives in good spirits. Presents to the clinic for hypertension management. Patient was referred by Sharyn Lull on 04/18/19. BP was above goal on that day; Sharyn Lull added HCTZ 25 mg daily to pt's regimen.   Patient reports adherence with medications. Denies chest pain, shortness of breath, HA or blurred vision.   Current BP Medications include:  Amlodipine 5 mg daily, HCTZ 25 mg daily, metoprolol 100 mg BID  Dietary habits include: reports cutting out salt in April; denies drinking caffeine  Exercise habits include: exercises on an exercise bike 20 minutes, three times a day Family / Social history:  - FHx: no pertinent positives listed  - Tobacco: reports cutting back to a couple cigarettes a day since starting NRT - Alcohol: denies   Home BP readings:  - Reports 120s/80s at home since seeing Sharyn Lull   O:  L arm after 5 minutes rest: 113/73, HR 75  Last 3 Office BP readings: BP Readings from Last 3 Encounters:  05/02/19 113/73  04/18/19 (!) 142/87  04/04/19 (!) 169/112   BMET    Component Value Date/Time   NA 143 04/04/2019 1600   K 3.8 04/04/2019 1600   CL 105 04/04/2019 1600   CO2 20 04/04/2019 1600   GLUCOSE 125 (H) 04/04/2019 1600   GLUCOSE 152 (H) 12/24/2017 0536   BUN 16 04/04/2019 1600   CREATININE 0.90 04/04/2019 1600   CREATININE 0.83 01/16/2014 1205   CALCIUM 10.2 04/04/2019 1600   GFRNONAA 68 04/04/2019 1600   GFRAA 78 04/04/2019 1600    Renal function: CrCl cannot be calculated (Patient's most recent lab result is older than the maximum 21 days allowed.).  Clinical ASCVD: No  The 10-year ASCVD risk score Mikey Bussing DC Jr., et al., 2013) is: 12.9%   Values used to calculate the score:     Age: 63 years     Sex: Female     Is Non-Hispanic African American: Yes     Diabetic: No     Tobacco smoker: Yes     Systolic Blood Pressure: 638 mmHg     Is BP treated: Yes     HDL Cholesterol: 44 mg/dL     Total Cholesterol: 196 mg/dL   A/P: Hypertension longstanding currently at goal on current medications. BP Goal = <130/80 mmHg. Patient is adherent with current medications.  -Continued antihypertensives.  -Started atorvastatin 20 mg daily.   -F/u labs ordered - LFTs at next visit after atorvastatin start.  -Counseled on lifestyle modifications for blood pressure control including reduced dietary sodium, increased exercise, adequate sleep -ASCVD: primary prevention in patient without DM. ASCVD risk score less than 20% with improved BP. She did not pick up the 40 mg atorvastatin. Will send in for 20 mg daily.  -HM: tetanus, Pneumovax due; will address at next visit.   Results reviewed and written information provided. Total time in face-to-face counseling 15 minutes.   F/U Clinic Visit in 1 month.    Benard Halsted, PharmD, [place] [phone]

## 2019-05-02 NOTE — Patient Instructions (Signed)

## 2019-05-29 ENCOUNTER — Other Ambulatory Visit: Payer: Self-pay | Admitting: Primary Care

## 2019-05-29 DIAGNOSIS — I10 Essential (primary) hypertension: Secondary | ICD-10-CM

## 2019-05-29 MED FILL — METOPROLOL TARTRATE 100 MG: 100 | 30 days supply | Qty: 60 | Fill #2

## 2019-05-29 MED FILL — ?AMLODIPINE BESYLATE 5MG TA: 5 | 30 days supply | Qty: 60 | Fill #0

## 2019-05-29 MED FILL — AMITRIPTYLINE HCL 25 MG TAB: 25 | 30 days supply | Qty: 30 | Fill #2

## 2019-05-29 MED FILL — ?HYDROCHLOROTHIAZIDE 25MG T: 25 | 30 days supply | Qty: 30 | Fill #1

## 2019-05-29 MED FILL — ?ATORVASTATIN 20 MG TABLET: 20 | 30 days supply | Qty: 30 | Fill #1

## 2019-05-29 MED FILL — ?LORATADINE 10 MG TABS: 10 | 30 days supply | Qty: 30 | Fill #1

## 2019-06-02 ENCOUNTER — Encounter: Payer: Self-pay | Admitting: Pharmacist

## 2019-06-02 ENCOUNTER — Ambulatory Visit: Payer: Self-pay | Attending: Family Medicine | Admitting: Pharmacist

## 2019-06-02 ENCOUNTER — Other Ambulatory Visit: Payer: Self-pay

## 2019-06-02 VITALS — BP 115/71 | HR 80

## 2019-06-02 DIAGNOSIS — I10 Essential (primary) hypertension: Secondary | ICD-10-CM

## 2019-06-02 MED FILL — !VENTOLIN HFA INHALER: 108 (90 BAS | 16 days supply | Qty: 18 | Fill #1

## 2019-06-02 NOTE — Progress Notes (Signed)
   S:    PCP: Michelle  Patient arrives in good spirits. Presents to the clinic for hypertension management. Patient was referred by Sharyn Lull on 04/18/19. BP was above goal on that day; Sharyn Lull added HCTZ 25 mg daily to pt's regimen.  I saw her 05/02/19 - her BP at that visit 113/73.   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  Patient reports adherence with medications. Denies chest pain, shortness of breath, HA or blurred vision.   Current BP Medications include:  Amlodipine 10 mg daily, HCTZ 25 mg daily, metoprolol 100 mg BID  Dietary habits include: "99% salt-free"; denies drinking caffeine  Exercise habits include: exercises on an exercise bike 20 minutes, three times a day; also has been walking Family / Social history:  - FHx: no pertinent positives listed  - Tobacco: reports cutting back to a couple cigarettes a day since starting NRT - Alcohol: denies   Home BP readings:  - 120s/80s  O:  L arm after 5 minutes rest: 115/71, HR 80  Last 3 Office BP readings: BP Readings from Last 3 Encounters:  05/02/19 113/73  04/18/19 (!) 142/87  04/04/19 (!) 169/112   BMET    Component Value Date/Time   NA 143 04/04/2019 1600   K 3.8 04/04/2019 1600   CL 105 04/04/2019 1600   CO2 20 04/04/2019 1600   GLUCOSE 125 (H) 04/04/2019 1600   GLUCOSE 152 (H) 12/24/2017 0536   BUN 16 04/04/2019 1600   CREATININE 0.90 04/04/2019 1600   CREATININE 0.83 01/16/2014 1205   CALCIUM 10.2 04/04/2019 1600   GFRNONAA 68 04/04/2019 1600   GFRAA 78 04/04/2019 1600   Renal function: CrCl cannot be calculated (Patient's most recent lab result is older than the maximum 21 days allowed.).  Clinical ASCVD: No  The 10-year ASCVD risk score Mikey Bussing DC Jr., et al., 2013) is: 12.9%   Values used to calculate the score:     Age: 63 years     Sex: Female     Is Non-Hispanic African American: Yes     Diabetic: No     Tobacco smoker: Yes     Systolic Blood Pressure: 876 mmHg   Is BP treated: Yes     HDL Cholesterol: 44 mg/dL     Total Cholesterol: 196 mg/dL  A/P: Hypertension longstanding currently at goal on current medications. BP Goal = <130/80 mmHg. Patient is adherent with current medications.  -Continued antihypertensives.  -LFTs -Counseled on lifestyle modifications for blood pressure control including reduced dietary sodium, increased exercise, adequate sleep -ASCVD: primary prevention in patient without DM. ASCVD risk score less than 20% with improved BP. Continue atorvastatin 20 mg daily.  -HM: tetanus, Pneumovax due; administered - will coordinate with Pharmacy for MAP   Results reviewed and written information provided. Total time in face-to-face counseling 15 minutes.   F/U Clinic Visit in 1 month.    Benard Halsted, PharmD, [place] [phone]

## 2019-06-02 NOTE — Patient Instructions (Addendum)
Thank you for coming to see [place] today.   Blood pressure today is well-controlled.   Continue taking blood pressure medications as prescribed.   Limiting salt and caffeine, as well as exercising as able for at least 30 minutes for 5 days out of the week, can also help you lower your blood pressure.  Take your blood pressure at home if you are able. Please write down these numbers and bring them to your visits.  If you have any questions about medications, please call me [phone].  Follow-up with your PCP in August.   Lurena Joiner

## 2019-06-03 LAB — HEPATIC FUNCTION PANEL
ALT: 19 IU/L (ref 0–32)
AST: 21 IU/L (ref 0–40)
Albumin: 4.4 g/dL (ref 3.8–4.8)
Alkaline Phosphatase: 163 IU/L — ABNORMAL HIGH (ref 39–117)
Bilirubin Total: 0.4 mg/dL (ref 0.0–1.2)
Bilirubin, Direct: 0.12 mg/dL (ref 0.00–0.40)
Total Protein: 6.8 g/dL (ref 6.0–8.5)

## 2019-06-29 ENCOUNTER — Other Ambulatory Visit: Payer: Self-pay | Admitting: Primary Care

## 2019-06-29 ENCOUNTER — Other Ambulatory Visit (INDEPENDENT_AMBULATORY_CARE_PROVIDER_SITE_OTHER): Payer: Self-pay | Admitting: Primary Care

## 2019-06-29 DIAGNOSIS — I10 Essential (primary) hypertension: Secondary | ICD-10-CM

## 2019-06-29 MED FILL — METOPROLOL TARTRATE 100 MG: 100 | 30 days supply | Qty: 60 | Fill #0

## 2019-06-29 MED FILL — AMITRIPTYLINE HCL 25 MG TAB: 25 | 30 days supply | Qty: 30 | Fill #3

## 2019-06-29 MED FILL — ?ATORVASTATIN 20 MG TABLET: 20 | 30 days supply | Qty: 30 | Fill #2

## 2019-06-29 MED FILL — !VENTOLIN HFA INHALER: 108 (90 BAS | 16 days supply | Qty: 18 | Fill #2

## 2019-06-29 MED FILL — ?AMLODIPINE BESYLATE 5MG TA: 5 | 30 days supply | Qty: 60 | Fill #0

## 2019-06-29 MED FILL — ?HYDROCHLOROTHIAZIDE 25MG T: 25 | 30 days supply | Qty: 30 | Fill #2

## 2019-07-27 ENCOUNTER — Other Ambulatory Visit: Payer: Self-pay | Admitting: Family Medicine

## 2019-07-27 ENCOUNTER — Other Ambulatory Visit: Payer: Self-pay | Admitting: Primary Care

## 2019-07-27 DIAGNOSIS — I10 Essential (primary) hypertension: Secondary | ICD-10-CM

## 2019-07-27 DIAGNOSIS — N3281 Overactive bladder: Secondary | ICD-10-CM

## 2019-07-27 MED FILL — ?HYDROCHLOROTHIAZIDE 25MG T: 25 | 30 days supply | Qty: 30 | Fill #0

## 2019-07-27 MED FILL — METOPROLOL TARTRATE 100 MG: 100 | 30 days supply | Qty: 60 | Fill #1

## 2019-07-27 MED FILL — ALBUTEROL SULFATE HFA 108 (: 108 (90 BAS | 25 days supply | Qty: 18 | Fill #3

## 2019-07-27 MED FILL — ?AMITRIPTYLINE HCL 25 MG TA: 25 | 30 days supply | Qty: 30 | Fill #0

## 2019-07-27 NOTE — Telephone Encounter (Signed)
FWD to PCP. Tempestt S Roberts, CMA  

## 2019-07-28 MED FILL — ATORVASTATIN 20 MG TABLET: 20 | 30 days supply | Qty: 30 | Fill #0

## 2019-08-28 MED FILL — METOPROLOL TARTRATE 100 MG: 100 | 30 days supply | Qty: 60 | Fill #2

## 2019-08-28 MED FILL — ALBUTEROL SULFATE HFA 108 (: 108 (90 BAS | 25 days supply | Qty: 18 | Fill #4

## 2019-08-28 MED FILL — ?HYDROCHLOROTHIAZIDE 25MG T: 25 | 30 days supply | Qty: 30 | Fill #1

## 2019-08-28 MED FILL — ?AMITRIPTYLINE HCL 25 MG TA: 25 | 30 days supply | Qty: 30 | Fill #1

## 2019-08-28 MED FILL — ATORVASTATIN 20 MG TABLET: 20 | 30 days supply | Qty: 30 | Fill #1

## 2019-09-01 MED FILL — ?AMLODIPINE BESYLATE 5MG TA: 5 | 30 days supply | Qty: 60 | Fill #1

## 2019-09-21 ENCOUNTER — Encounter (HOSPITAL_COMMUNITY): Payer: Self-pay | Admitting: Emergency Medicine

## 2019-09-21 DIAGNOSIS — I10 Essential (primary) hypertension: Secondary | ICD-10-CM | POA: Insufficient documentation

## 2019-09-21 DIAGNOSIS — R739 Hyperglycemia, unspecified: Secondary | ICD-10-CM | POA: Insufficient documentation

## 2019-09-21 DIAGNOSIS — H538 Other visual disturbances: Secondary | ICD-10-CM | POA: Insufficient documentation

## 2019-09-21 DIAGNOSIS — F17210 Nicotine dependence, cigarettes, uncomplicated: Secondary | ICD-10-CM | POA: Insufficient documentation

## 2019-09-21 DIAGNOSIS — Z79899 Other long term (current) drug therapy: Secondary | ICD-10-CM | POA: Insufficient documentation

## 2019-09-21 NOTE — ED Triage Notes (Signed)
Patient reports blurred vision in bilateral eyes since yesterday and dizziness today. States dizziness worsens with movement. Denies headache and weakness. Denies recent falls.

## 2019-09-22 ENCOUNTER — Emergency Department (HOSPITAL_COMMUNITY): Payer: Self-pay

## 2019-09-22 ENCOUNTER — Emergency Department (HOSPITAL_COMMUNITY)
Admission: EM | Admit: 2019-09-22 | Discharge: 2019-09-22 | Disposition: A | Discharge status: Home / Self Care | Payer: Self-pay | Attending: Emergency Medicine | Admitting: Emergency Medicine

## 2019-09-22 ENCOUNTER — Other Ambulatory Visit: Payer: Self-pay

## 2019-09-22 DIAGNOSIS — R739 Hyperglycemia, unspecified: Secondary | ICD-10-CM

## 2019-09-22 LAB — URINALYSIS, ROUTINE W REFLEX MICROSCOPIC
Bacteria, UA: NONE SEEN
Bilirubin Urine: NEGATIVE
Glucose, UA: >=500 mg/dL — AB
Hgb urine dipstick: NEGATIVE
Ketones, ur: 20 mg/dL — AB
Leukocytes,Ua: NEGATIVE
Nitrite: NEGATIVE
Protein, ur: NEGATIVE mg/dL
Specific Gravity, Urine: 1.029 (ref 1.005–1.030)
pH: 6 (ref 5.0–8.0)

## 2019-09-22 LAB — CBC WITH DIFFERENTIAL/PLATELET
Abs Immature Granulocytes: 0.03 10*3/uL (ref 0.00–0.07)
Basophils Absolute: 0 10*3/uL (ref 0.0–0.1)
Basophils Relative: 1 %
Eosinophils Absolute: 0.2 10*3/uL (ref 0.0–0.5)
Eosinophils Relative: 3 %
HCT: 43.8 % (ref 36.0–46.0)
Hemoglobin: 14.5 g/dL (ref 12.0–15.0)
Immature Granulocytes: 0 %
Lymphocytes Relative: 36 %
Lymphs Abs: 2.5 10*3/uL (ref 0.7–4.0)
MCH: 29.2 pg (ref 26.0–34.0)
MCHC: 33.1 g/dL (ref 30.0–36.0)
MCV: 88.3 fL (ref 80.0–100.0)
Monocytes Absolute: 0.5 10*3/uL (ref 0.1–1.0)
Monocytes Relative: 8 %
Neutro Abs: 3.5 10*3/uL (ref 1.7–7.7)
Neutrophils Relative %: 52 %
Platelets: 272 10*3/uL (ref 150–400)
RBC: 4.96 MIL/uL (ref 3.87–5.11)
RDW: 12.9 % (ref 11.5–15.5)
WBC: 6.8 10*3/uL (ref 4.0–10.5)
nRBC: 0 % (ref 0.0–0.2)

## 2019-09-22 LAB — BASIC METABOLIC PANEL
Anion gap: 14 (ref 5–15)
BUN: 16 mg/dL (ref 8–23)
CO2: 20 mmol/L — ABNORMAL LOW (ref 22–32)
Calcium: 9.8 mg/dL (ref 8.9–10.3)
Chloride: 98 mmol/L (ref 98–111)
Creatinine, Ser: 0.97 mg/dL (ref 0.44–1.00)
GFR calc Af Amer: >60 mL/min (ref >60)
GFR calc non Af Amer: >60 mL/min (ref >60)
Glucose, Bld: 623 mg/dL (ref 70–99)
Potassium: 4.3 mmol/L (ref 3.5–5.1)
Sodium: 132 mmol/L — ABNORMAL LOW (ref 135–145)

## 2019-09-22 LAB — GLUCOSE, CAPILLARY
Glucose-Capillary: 539 mg/dL (ref 70–99)
Glucose-Capillary: 328 mg/dL — ABNORMAL HIGH (ref 70–99)
Glucose-Capillary: 296 mg/dL — ABNORMAL HIGH (ref 70–99)

## 2019-09-22 MED ORDER — INSULIN ASPART 100 UNIT/ML ~~LOC~~ SOLN
10.0000 [IU] | Freq: Once | SUBCUTANEOUS | Status: AC
  Administered 2019-09-22: 10 [IU] via INTRAVENOUS
  Filled 2019-09-22: qty 0.1

## 2019-09-22 MED ORDER — METFORMIN HCL 500 MG PO TABS: 500.0000 mg | ORAL_TABLET | Freq: Two times a day (BID) | ORAL | 1 refills | Status: DC

## 2019-09-22 MED ORDER — METFORMIN HCL 1000 MG PO TABS: 500.0000 mg | ORAL_TABLET | Freq: Two times a day (BID) | ORAL | 1 refills | Status: DC

## 2019-09-22 MED ORDER — INSULIN ASPART 100 UNIT/ML ~~LOC~~ SOLN
5.0000 [IU] | Freq: Once | SUBCUTANEOUS | Status: AC
  Administered 2019-09-22: 5 [IU] via INTRAVENOUS
  Filled 2019-09-22: qty 0.05

## 2019-09-22 MED ORDER — METFORMIN HCL 500 MG PO TABS
500.0000 mg | ORAL_TABLET | Freq: Once | ORAL | Status: AC
  Administered 2019-09-22: 500 mg via ORAL
  Filled 2019-09-22: qty 1

## 2019-09-22 MED ORDER — SODIUM CHLORIDE 0.9 % IV BOLUS (SEPSIS)
1000.0000 mL | Freq: Once | INTRAVENOUS | Status: AC
  Administered 2019-09-22: 1000 mL via INTRAVENOUS

## 2019-09-22 MED ORDER — INSULIN ASPART 100 UNIT/ML ~~LOC~~ SOLN
10.0000 [IU] | Freq: Once | SUBCUTANEOUS | Status: DC
  Filled 2019-09-22: qty 0.1

## 2019-09-22 MED FILL — metFORMIN HCL 500 MG TABS: 500 | 30 days supply | Qty: 60 | Fill #0

## 2019-09-22 NOTE — ED Provider Notes (Signed)
TIME SEEN: 3:27 AM  CHIEF COMPLAINT: Vertigo, dry mouth, polydipsia and polyuria  HPI: Patient is a 63 year old female with history of hypertension, kidney stones who presents to the emergency department with 1 month of dry mouth, polydipsia and polyuria and vertigo that started yesterday.  Also has had a blurry vision without vision loss.  No headaches, numbness, tingling or focal weakness.  No chest pain or shortness of breath.  No previous history of diabetes.  No recent steroid use.  ROS: See HPI Constitutional: no fever  Eyes: no drainage  ENT: no runny nose   Cardiovascular:  no chest pain  Resp: no SOB  GI: no vomiting GU: no dysuria Integumentary: no rash  Allergy: no hives  Musculoskeletal: no leg swelling  Neurological: no slurred speech ROS otherwise negative  PAST MEDICAL HISTORY/PAST SURGICAL HISTORY:  Past Medical History:  Diagnosis Date  . GERD (gastroesophageal reflux disease)   . Hypertension   . Renal disorder    3 kidney stones  . Sinus bradycardia seen on cardiac monitor   . Urinary frequency     MEDICATIONS:  Prior to Admission medications   Medication Sig Start Date End Date Taking? Authorizing Provider  albuterol (VENTOLIN HFA) 108 (90 Base) MCG/ACT inhaler Inhale 2 puffs into the lungs every 4 (four) hours as needed for wheezing. 04/18/19   Kerin Perna, NP  amitriptyline (ELAVIL) 25 MG tablet TAKE 1 TABLET (25 MG TOTAL) BY MOUTH AT BEDTIME. 07/27/19   Kerin Perna, NP  amLODipine (NORVASC) 5 MG tablet TAKE 2 TABLETS (10 MG TOTAL) BY MOUTH DAILY. 06/29/19   Kerin Perna, NP  atorvastatin (LIPITOR) 20 MG tablet TAKE 1 TABLET (20 MG TOTAL) BY MOUTH DAILY. 07/28/19   Kerin Perna, NP  co-enzyme Q-10 30 MG capsule Take 30 mg by mouth daily.    [provider]  diphenhydrAMINE (BENADRYL ALLERGY) 25 MG tablet Take 1 tablet (25 mg total) by mouth every 6 (six) hours as needed. 02/16/18   Clent Demark, PA-C  docusate sodium  (COLACE) 100 MG capsule Take 1 capsule (100 mg total) by mouth 2 (two) times daily. 12/24/17   Swinteck, Aaron Edelman, MD  esomeprazole (NEXIUM) 40 MG capsule Take 1 capsule (40 mg total) by mouth daily at 12 noon. 05/02/19   Charlott Rakes, MD  hydrochlorothiazide (HYDRODIURIL) 25 MG tablet TAKE 1 TABLET (25 MG TOTAL) BY MOUTH DAILY. 07/27/19   Kerin Perna, NP  loratadine (CLARITIN) 10 MG tablet Take 1 tablet (10 mg total) by mouth daily. 04/18/19   Kerin Perna, NP  metoprolol tartrate (LOPRESSOR) 100 MG tablet TAKE 1 TABLET (100 MG TOTAL) BY MOUTH 2 (TWO) TIMES DAILY. 06/29/19   Kerin Perna, NP  Multiple Vitamins-Calcium (ONE-A-DAY WOMENS PO) Take 1 tablet by mouth daily.    [provider]  nicotine (NICODERM CQ) 14 mg/24hr patch Place 1 patch (14 mg total) onto the skin daily. 04/18/19   Kerin Perna, NP  nicotine (NICODERM CQ) 21 mg/24hr patch Place 1 patch (21 mg total) onto the skin daily. 04/18/19   Kerin Perna, NP  nicotine (NICODERM CQ) 7 mg/24hr patch Place 1 patch (7 mg total) onto the skin daily. 04/18/19   Kerin Perna, NP    ALLERGIES:  Allergies  Allergen Reactions  . Hydrocodone-Acetaminophen Hives and Other (See Comments)    Can take with Benadryl     SOCIAL HISTORY:  Social History   Tobacco Use  . Smoking status: Current  Every Day Smoker    Packs/day: 0.50    Types: Cigarettes  . Smokeless tobacco: Never Used  . Tobacco comment: down to 2 cigs a day   Substance Use Topics  . Alcohol use: No    FAMILY HISTORY: Family History  Problem Relation Age of Onset  . Aneurysm Mother   . Alzheimer's disease Father     EXAM: BP (!) 147/79 (BP Location: Right Arm)   Pulse (!) 103   Temp 98.3 F (36.8 C) (Oral)   Resp 16   SpO2 98%  CONSTITUTIONAL: Alert and oriented and responds appropriately to questions. Well-appearing; well-nourished HEAD: Normocephalic EYES: Conjunctivae clear, pupils appear equal, EOMI ENT: normal nose;  moist mucous membranes NECK: Supple, no meningismus, no nuchal rigidity, no LAD  CARD: RRR; S1 and S2 appreciated; no murmurs, no clicks, no rubs, no gallops RESP: Normal chest excursion without splinting or tachypnea; breath sounds clear and equal bilaterally; no wheezes, no rhonchi, no rales, no hypoxia or respiratory distress, speaking full sentences ABD/GI: Normal bowel sounds; non-distended; soft, non-tender, no rebound, no guarding, no peritoneal signs, no hepatosplenomegaly BACK:  The back appears normal and is non-tender to palpation, there is no CVA tenderness EXT: Normal ROM in all joints; non-tender to palpation; no edema; normal capillary refill; no cyanosis, no calf tenderness or swelling    SKIN: Normal color for age and race; warm; no rash NEURO: Moves all extremities equally, normal speech, cranial nerves II through XII intact, normal gait, normal sensation diffusely PSYCH: The patient's mood and manner are appropriate. Grooming and personal hygiene are appropriate.  MEDICAL DECISION MAKING: Patient here with vertigo, polydipsia, polyuria.  Head CT obtained in triage shows no acute abnormality.  She has no focal neurologic deficits here.  Symptoms likely secondary to hyperglycemia.  Her bicarb is 20 but she has a normal anion gap.  Urinalysis pending.  Will give IV fluids, IV insulin and reassess.  ED PROGRESS: Blood sugar has improved to 328.  She states she is feeling better.  Will give third liter of IV fluids another dose of IV insulin.  Urine does show 20 ketones but again doubt DKA given she has no anion gap.  7:07 AM  Pt's blood glucose now under 300.  I feel she is safe to be discharged home on metformin.  Recommended diet changes and close follow-up with her primary care provider at [facility].  Discussed return precautions.  Recommended she obtain a glucometer over-the-counter and check her blood sugar 4 times daily.   At this time, I do not feel there is any  life-threatening condition present. I have reviewed and discussed all results (EKG, imaging, lab, urine as appropriate) and exam findings with patient/family. I have reviewed nursing notes and appropriate previous records.  I feel the patient is safe to be discharged home without further emergent workup and can continue workup as an outpatient as needed. Discussed usual and customary return precautions. Patient/family verbalize understanding and are comfortable with this plan.  Outpatient follow-up has been provided as needed. All questions have been answered.  Amber Meyer was evaluated in Emergency Department on 09/22/2019 for the symptoms described in the history of present illness. She was evaluated in the context of the global COVID-19 pandemic, which necessitated consideration that the patient might be at risk for infection with the SARS-CoV-2 virus that causes COVID-19. Institutional protocols and algorithms that pertain to the evaluation of patients at risk for COVID-19 are in a state of  rapid change based on information released by regulatory bodies including the CDC and federal and state organizations. These policies and algorithms were followed during the patient's care in the ED.    Amber Meyer, Delice Bison, DO 09/22/19 [phone]

## 2019-09-22 NOTE — ED Notes (Signed)
Pt ambulated to restroom without concerns

## 2019-09-22 NOTE — Discharge Instructions (Addendum)
I recommend that you check your blood sugar 4 times daily and keep a log.  I recommend close follow-up with your primary care provider.  Please take your metformin as prescribed.

## 2019-09-25 ENCOUNTER — Telehealth (INDEPENDENT_AMBULATORY_CARE_PROVIDER_SITE_OTHER): Payer: Self-pay

## 2019-09-25 NOTE — Telephone Encounter (Signed)
Patient called to inform that she was recently in the hospital on Friday and was diagnosed with Diabetes. Patient states she checked her blood sugars this morning at 4am and it was 366. Patient states she rechecked it at 11:18am after eating and it went up to 459. Patient states that the hospital gave her 500mg  of metformin and wants to know what she has to do in order to bring her blood sugars down or does she increase he medication until she she her PCP.  Please advice [phone]  Thank you Whitney Post

## 2019-09-25 NOTE — Telephone Encounter (Signed)
FWD to provider. Nat Christen, CMA

## 2019-09-25 NOTE — Telephone Encounter (Signed)
Blood sugar will not go done on 500mg  of metformin bid. Inform her to go to Urgent care and make an appointment as soon as available . Advise.decrease foods that are high in carbohydrates are the following rice, potatoes, breads, sugars, and pastas.  Reduction in the intake (eating) will assist in lowering your blood sugars.

## 2019-09-26 NOTE — Telephone Encounter (Signed)
Patient aware. Amber Meyer, CMA  

## 2019-09-27 ENCOUNTER — Other Ambulatory Visit: Payer: Self-pay | Admitting: Primary Care

## 2019-09-27 DIAGNOSIS — I10 Essential (primary) hypertension: Secondary | ICD-10-CM

## 2019-09-27 MED FILL — ?HYDROCHLOROTHIAZIDE 25MG T: 25 | 30 days supply | Qty: 30 | Fill #2

## 2019-09-27 MED FILL — ?ATORVASTATIN 20 MG TABLET: 20 | 30 days supply | Qty: 30 | Fill #2

## 2019-09-27 MED FILL — ?AMITRIPTYLINE HCL 25 MG TA: 25 | 30 days supply | Qty: 30 | Fill #2

## 2019-09-27 MED FILL — ?AMLODIPINE BESYLATE 5MG TA: 5 | 30 days supply | Qty: 60 | Fill #2

## 2019-09-28 ENCOUNTER — Other Ambulatory Visit: Payer: Self-pay

## 2019-09-28 ENCOUNTER — Ambulatory Visit (INDEPENDENT_AMBULATORY_CARE_PROVIDER_SITE_OTHER): Payer: Self-pay | Admitting: Primary Care

## 2019-09-28 ENCOUNTER — Encounter (INDEPENDENT_AMBULATORY_CARE_PROVIDER_SITE_OTHER): Payer: Self-pay | Admitting: Primary Care

## 2019-09-28 VITALS — BP 132/85 | HR 96 | Temp 97.3°F | Ht 67.0 in | Wt 225.4 lb

## 2019-09-28 DIAGNOSIS — Z1159 Encounter for screening for other viral diseases: Secondary | ICD-10-CM

## 2019-09-28 DIAGNOSIS — Z23 Encounter for immunization: Secondary | ICD-10-CM

## 2019-09-28 DIAGNOSIS — R739 Hyperglycemia, unspecified: Secondary | ICD-10-CM

## 2019-09-28 DIAGNOSIS — E1165 Type 2 diabetes mellitus with hyperglycemia: Secondary | ICD-10-CM

## 2019-09-28 DIAGNOSIS — I10 Essential (primary) hypertension: Secondary | ICD-10-CM

## 2019-09-28 DIAGNOSIS — Z131 Encounter for screening for diabetes mellitus: Secondary | ICD-10-CM

## 2019-09-28 DIAGNOSIS — Z114 Encounter for screening for human immunodeficiency virus [HIV]: Secondary | ICD-10-CM

## 2019-09-28 DIAGNOSIS — Z1211 Encounter for screening for malignant neoplasm of colon: Secondary | ICD-10-CM

## 2019-09-28 LAB — POCT GLYCOSYLATED HEMOGLOBIN (HGB A1C): Hemoglobin A1C: 14.8 % — AB (ref 4.0–5.6)

## 2019-09-28 LAB — POCT URINALYSIS DIP (CLINITEK)
Glucose, UA: 500 mg/dL — AB
Leukocytes, UA: NEGATIVE
Nitrite, UA: NEGATIVE
POC PROTEIN,UA: 30 — AB
Spec Grav, UA: 1.02 (ref 1.010–1.025)
Urobilinogen, UA: 1 E.U./dL
pH, UA: 5.5 (ref 5.0–8.0)

## 2019-09-28 LAB — GLUCOSE, POCT (MANUAL RESULT ENTRY): POC Glucose: 352 mg/dl — AB (ref 70–99)

## 2019-09-28 MED ORDER — INSULIN ASPART 100 UNIT/ML ~~LOC~~ SOLN
15.0000 [IU] | Freq: Once | SUBCUTANEOUS | Status: AC
  Administered 2019-09-28: 15 [IU] via SUBCUTANEOUS

## 2019-09-28 MED ORDER — GLIPIZIDE 10 MG PO TABS: 10.0000 mg | ORAL_TABLET | Freq: Two times a day (BID) | ORAL | 1 refills | Status: DC

## 2019-09-28 MED ORDER — METFORMIN HCL 1000 MG PO TABS: 1000.0000 mg | ORAL_TABLET | Freq: Two times a day (BID) | ORAL | 1 refills | Status: DC

## 2019-09-28 MED ORDER — LOSARTAN POTASSIUM-HCTZ 100-25 MG PO TABS: 1.0000 | ORAL_TABLET | Freq: Every day | ORAL | 3 refills | Status: DC

## 2019-09-28 MED ORDER — METOPROLOL TARTRATE 25 MG PO TABS: 25.0000 mg | ORAL_TABLET | Freq: Two times a day (BID) | ORAL | 1 refills | Status: DC

## 2019-09-28 MED ORDER — TOLTERODINE TARTRATE 1 MG PO TABS: 1.0000 mg | ORAL_TABLET | Freq: Two times a day (BID) | ORAL | 3 refills | Status: DC

## 2019-09-28 MED FILL — metFORMIN HCL 1000 MG TABS: 1000 | 30 days supply | Qty: 60 | Fill #0

## 2019-09-28 MED FILL — LOSARTAN-HCTZ 100-25 MG TAB: 100-25 | 30 days supply | Qty: 30 | Fill #0

## 2019-09-28 MED FILL — ?GLIPIZIDE 10 MG TABLET: 10 | 30 days supply | Qty: 60 | Fill #0

## 2019-09-28 MED FILL — ?METOPROLOL 25 MG TABLET: 25 | 30 days supply | Qty: 60 | Fill #0

## 2019-09-28 NOTE — Patient Instructions (Signed)

## 2019-09-28 NOTE — Progress Notes (Signed)
Established Patient Office Visit  Subjective:  Patient ID: Amber Meyer, female    DOB: Dec 30, 1955  Age: 63 y.o. MRN: [id number]  CC:  Chief Complaint  Patient presents with  . Hospitalization Follow-up    hyperglycemia    HPI Amber Meyer presents for hospital follow up new diagnosis of type 2 diabetes. Patient went to the emergency room because her vision became blurred. A1C today is 14.8 and CBG- 352. She knew something was wrong started drinking increase water and never got thirist quench.  Past Medical History:  Diagnosis Date  . GERD (gastroesophageal reflux disease)   . Hypertension   . Renal disorder    3 kidney stones  . Sinus bradycardia seen on cardiac monitor   . Urinary frequency     Past Surgical History:  Procedure Laterality Date  . ABDOMINAL HYSTERECTOMY  1981   partial  . Kidney stone removal Left 1999  . KNEE ARTHROPLASTY Right 12/23/2017   Procedure: RIGHT TOTAL KNEE ARTHROPLASTY WITH COMPUTER NAVIGATION;  Surgeon: Rod Can, MD;  Location: [facility];  Service: Orthopedics;  Laterality: Right;  NEEDS RNFA  . KNEE SURGERY Right 2013   cleaned out damaged tissue and arthritis  . LITHOTRIPSY  2003, 2006   x 2    Family History  Problem Relation Age of Onset  . Aneurysm Mother   . Alzheimer's disease Father     Social History   Socioeconomic History  . Marital status: Single    Spouse name: Not on file  . Number of children: Not on file  . Years of education: Not on file  . Highest education level: Not on file  Occupational History  . Not on file  Social Needs  . Financial resource strain: Not on file  . Food insecurity    Worry: Not on file    Inability: Not on file  . Transportation needs    Medical: Not on file    Non-medical: Not on file  Tobacco Use  . Smoking status: Current Every Day Smoker    Packs/day: 0.50    Types: Cigarettes  . Smokeless tobacco: Never Used  . Tobacco comment: down to 2 cigs a day    Substance and Sexual Activity  . Alcohol use: No  . Drug use: No  . Sexual activity: Not Currently  Lifestyle  . Physical activity    Days per week: Not on file    Minutes per session: Not on file  . Stress: Not on file  Relationships  . Social Herbalist on phone: Not on file    Gets together: Not on file    Attends religious service: Not on file    Active member of club or organization: Not on file    Attends meetings of clubs or organizations: Not on file    Relationship status: Not on file  . Intimate partner violence    Fear of current or ex partner: Not on file    Emotionally abused: Not on file    Physically abused: Not on file    Forced sexual activity: Not on file  Other Topics Concern  . Not on file  Social History Narrative  . Not on file    Outpatient Medications Prior to Visit  Medication Sig Dispense Refill  . albuterol (VENTOLIN HFA) 108 (90 Base) MCG/ACT inhaler Inhale 2 puffs into the lungs every 4 (four) hours as needed for wheezing. 1 Inhaler 12  . amitriptyline (ELAVIL) 25  MG tablet TAKE 1 TABLET (25 MG TOTAL) BY MOUTH AT BEDTIME. 30 tablet 3  . amLODipine (NORVASC) 5 MG tablet TAKE 2 TABLETS (10 MG TOTAL) BY MOUTH DAILY. 60 tablet 2  . atorvastatin (LIPITOR) 20 MG tablet TAKE 1 TABLET (20 MG TOTAL) BY MOUTH DAILY. 30 tablet 2  . co-enzyme Q-10 30 MG capsule Take 30 mg by mouth daily.    Marland Kitchen docusate sodium (COLACE) 100 MG capsule Take 1 capsule (100 mg total) by mouth 2 (two) times daily. 60 capsule 1  . esomeprazole (NEXIUM) 40 MG capsule Take 1 capsule (40 mg total) by mouth daily at 12 noon. 30 capsule 2  . loratadine (CLARITIN) 10 MG tablet Take 1 tablet (10 mg total) by mouth daily. 30 tablet 11  . Multiple Vitamins-Calcium (ONE-A-DAY WOMENS PO) Take 1 tablet by mouth daily.    . hydrochlorothiazide (HYDRODIURIL) 25 MG tablet TAKE 1 TABLET (25 MG TOTAL) BY MOUTH DAILY. 90 tablet 0  . metFORMIN (GLUCOPHAGE) 500 MG tablet Take 1 tablet (500 mg  total) by mouth 2 (two) times daily with a meal. 60 tablet 1  . metoprolol tartrate (LOPRESSOR) 100 MG tablet TAKE 1 TABLET (100 MG TOTAL) BY MOUTH 2 (TWO) TIMES DAILY. 60 tablet 0  . diphenhydrAMINE (BENADRYL ALLERGY) 25 MG tablet Take 1 tablet (25 mg total) by mouth every 6 (six) hours as needed. (Patient taking differently: Take 25 mg by mouth every 6 (six) hours as needed for itching. ) 30 tablet 0   No facility-administered medications prior to visit.     Allergies  Allergen Reactions  . Hydrocodone-Acetaminophen Hives and Other (See Comments)    Can take with Benadryl     ROS Review of Systems  Endocrine: Positive for polydipsia, polyphagia and polyuria.  All other systems reviewed and are negative.     Objective:    Physical Exam  Constitutional: She is oriented to person, place, and time. She appears well-developed and well-nourished.  obese  Eyes: Pupils are equal, round, and reactive to light. EOM are normal.  Neck: Normal range of motion. Neck supple.  Cardiovascular: Normal rate and regular rhythm.  Pulmonary/Chest: Effort normal and breath sounds normal.  Abdominal: Soft. Bowel sounds are normal.  Musculoskeletal: Normal range of motion.  Neurological: She is oriented to person, place, and time.  Skin: Skin is warm and dry.  Psychiatric: She has a normal mood and affect.    BP 132/85 (BP Location: Right Arm, Patient Position: Sitting, Cuff Size: Large)   Pulse 96   Temp (!) 97.3 F (36.3 C) (Temporal)   Ht 5' 7"  (1.702 m)   Wt 225 lb 6.4 oz (102.2 kg)   SpO2 95%   BMI 35.30 kg/m  Wt Readings from Last 3 Encounters:  09/28/19 225 lb 6.4 oz (102.2 kg)  02/16/18 203 lb (92.1 kg)  12/23/17 209 lb (94.8 kg)     Health Maintenance Due  Topic Date Due  . Hepatitis C Screening  1956-01-30  . HIV Screening  12/07/1969  . PAP SMEAR-Modifier  12/08/1975  . MAMMOGRAM  12/07/2004  . COLONOSCOPY  12/07/2004    There are no preventive care reminders to  display for this patient.  Lab Results  Component Value Date   TSH 0.597 02/16/2018   Lab Results  Component Value Date   WBC 6.4 10/02/2019   HGB 14.6 10/02/2019   HCT 43.3 10/02/2019   MCV 85 10/02/2019   PLT 273 10/02/2019   Lab Results  Component  Value Date   NA 143 10/02/2019   K 3.6 10/02/2019   CO2 24 10/02/2019   GLUCOSE 259 (H) 10/02/2019   BUN 17 10/02/2019   CREATININE 1.05 (H) 10/02/2019   BILITOT 0.3 10/02/2019   ALKPHOS 138 (H) 10/02/2019   AST 23 10/02/2019   ALT 24 10/02/2019   PROT 6.8 10/02/2019   ALBUMIN 4.2 10/02/2019   CALCIUM 10.2 10/02/2019   ANIONGAP 14 09/22/2019   Lab Results  Component Value Date   CHOL 107 10/02/2019   Lab Results  Component Value Date   HDL 53 10/02/2019   Lab Results  Component Value Date   LDLCALC 32 10/02/2019   Lab Results  Component Value Date   TRIG 124 10/02/2019   Lab Results  Component Value Date   CHOLHDL 2.0 10/02/2019   Lab Results  Component Value Date   HGBA1C 14.8 (A) 09/28/2019      Assessment & Plan:   [name] was seen today for hospitalization follow-up.  Diagnoses and all orders for this visit:  Screening for diabetes mellitus -     HgB A1c 14.8 uncontrolled discussed risk factors with uncontrolled diabetes decrease circulation causing delayed wound healing , neuropathy injury to foot-unknown may lead to amputation,  Kidney dysfunction can lead to dialysis. Importance of taking medication as prescribed. Decrease foods that are high in carbohydrates are the following rice, potatoes, breads, sugars, and pastas.  Reduction in the intake (eating) will assist in lowering your blood sugars.   Hyperglycemia -     Glucose (CBG) -     insulin aspart (novoLOG) injection 15 Units -     Microalbumin, urine -     POCT URINALYSIS DIP (CLINITEK) -     CBC with Differential/Platelet; Future -     CMP14+EGFR; Future -     Lipid panel; Future  Need for hepatitis C screening test -      Hepatitis C Antibody; Future  Encounter for screening for HIV -     HIV Antibody (routine testing w rflx); Future  Colon cancer screening -     Fecal occult blood, imunochemical; Future  Hypertension, unspecified type Counseled on blood pressure goal of less than 130/80, low-sodium diet, 150 minutes of moderate intensity exercise per week. Discussed medication compliance, adverse effects. -     metoprolol tartrate (LOPRESSOR) 25 MG tablet; Take 1 tablet (25 mg total) by mouth 2 (two) times daily.  Need for immunization against influenza -     Flu Vaccine QUAD 36+ mos IM  Other orders -     metFORMIN (GLUCOPHAGE) 1000 MG tablet; Take 1 tablet (1,000 mg total) by mouth 2 (two) times daily with a meal. -     glipiZIDE (GLUCOTROL) 10 MG tablet; Take 1 tablet (10 mg total) by mouth 2 (two) times daily before a meal. -     losartan-hydrochlorothiazide (HYZAAR) 100-25 MG tablet; Take 1 tablet by mouth daily. -     tolterodine (DETROL) 1 MG tablet; Take 1 tablet (1 mg total) by mouth 2 (two) times daily.    Meds ordered this encounter  Medications  . insulin aspart (novoLOG) injection 15 Units  . metFORMIN (GLUCOPHAGE) 1000 MG tablet    Sig: Take 1 tablet (1,000 mg total) by mouth 2 (two) times daily with a meal.    Dispense:  180 tablet    Refill:  1  . glipiZIDE (GLUCOTROL) 10 MG tablet    Sig: Take 1 tablet (10 mg total) by mouth  2 (two) times daily before a meal.    Dispense:  180 tablet    Refill:  1  . losartan-hydrochlorothiazide (HYZAAR) 100-25 MG tablet    Sig: Take 1 tablet by mouth daily.    Dispense:  90 tablet    Refill:  3  . metoprolol tartrate (LOPRESSOR) 25 MG tablet    Sig: Take 1 tablet (25 mg total) by mouth 2 (two) times daily.    Dispense:  180 tablet    Refill:  1  . tolterodine (DETROL) 1 MG tablet    Sig: Take 1 tablet (1 mg total) by mouth 2 (two) times daily.    Dispense:  60 tablet    Refill:  3    Follow-up: Return for follow up with [facility] new dx  of T2D first avaible . Scedule fasting lab.    Kerin Perna, NP

## 2019-09-29 ENCOUNTER — Telehealth (INDEPENDENT_AMBULATORY_CARE_PROVIDER_SITE_OTHER): Payer: Self-pay

## 2019-09-29 ENCOUNTER — Ambulatory Visit: Payer: Self-pay | Attending: Family Medicine | Admitting: Pharmacist

## 2019-09-29 ENCOUNTER — Other Ambulatory Visit: Payer: Self-pay | Admitting: Pharmacist

## 2019-09-29 DIAGNOSIS — R739 Hyperglycemia, unspecified: Secondary | ICD-10-CM

## 2019-09-29 LAB — MICROALBUMIN, URINE: Microalbumin, Urine: 33.8 ug/mL

## 2019-09-29 MED ORDER — TRUE METRIX BLOOD GLUCOSE TEST VI STRP: ORAL_STRIP | 12 refills | Status: DC

## 2019-09-29 MED ORDER — TRUEPLUS LANCETS 28G MISC: 11 refills | Status: DC

## 2019-09-29 MED ORDER — TRUE METRIX METER W/DEVICE KIT: PACK | 0 refills | Status: DC

## 2019-09-29 MED FILL — TRUE METRIX GLUCOSE TEST ST: 30 days supply | Qty: 100 | Fill #0

## 2019-09-29 MED FILL — TOLTERODINE TARTRATE 1 MG T: 1 | 30 days supply | Qty: 60 | Fill #0

## 2019-09-29 MED FILL — TRUEplus LANCETS 28G MISC: 30 days supply | Qty: 100 | Fill #0

## 2019-09-29 MED FILL — !TRUE METRIX BLOOD GLUCOSE: 30 days supply | Qty: 1 | Fill #0

## 2019-09-29 NOTE — Telephone Encounter (Signed)
FWD to PCP. Tempestt S Roberts, CMA  

## 2019-09-29 NOTE — Progress Notes (Signed)
    S:    PCP: Sharyn Lull   No chief complaint on file.  Patient arrives in good spirits.  Presents for diabetes evaluation, education, and management Patient was referred and last seen by Primary Care Provider on 09/28/19.     Family/Social History:  - No DM hx in family - Current 0.5 PPD smoker  - Denies alcohol use   Insurance coverage/medication affordability: self pay  Patient denies adherence with medications. Has not picked up from her pharmacy yet.  Current diabetes medications include: metformin 1000 mg BID, glipizide 10 mg BID Current hypertension medications include: amlodipine 5 mg daily, losartan-HCTZ 100-25 mg daily, metoprolol 25 mg BID Current hyperlipidemia medications include: atorvastatin 20 mg daily  Patient denies hypoglycemic events.  Patient reported dietary habits: - Reports eliminating refined sugars, added sugars, and sugar-sweetened beverages - Limits carbohydrates but does admit to occasionally eating high carb foods such as mac and cheese  Patient-reported exercise habits:  - Denies    Patient denies polyuria but endorses some polydipsia.  Patient denies neuropathy. Patient reports improvement in vision. Patient denies self foot exams.    O:   Lab Results  Component Value Date   HGBA1C 14.8 (A) 09/28/2019   There were no vitals filed for this visit.  Lipid Panel     Component Value Date/Time   CHOL 196 04/04/2019 1651   TRIG 288 (H) 04/04/2019 1651   HDL 44 04/04/2019 1651   CHOLHDL 4.5 (H) 04/04/2019 1651   LDLCALC 94 04/04/2019 1651   Home CBG: 200-300  Clinical ASCVD: No  The 10-year ASCVD risk score Mikey Bussing DC Jr., et al., 2013) is: 18.5%   Values used to calculate the score:     Age: 61 years     Sex: Female     Is Non-Hispanic African American: Yes     Diabetic: No     Tobacco smoker: Yes     Systolic Blood Pressure: 948 mmHg     Is BP treated: Yes     HDL Cholesterol: 44 mg/dL     Total Cholesterol: 196 mg/dL   A/P:  Diabetes longstanding currently uncontrolled. Patient is able to verbalize appropriate hypoglycemia management plan. Patient is not adherent with medication but plans to pick-up today. Control is suboptimal due to dietary indiscretion and physical inactivity.   -Continued current medications. -Will have her follow-up in 1 month for evaluation of blood sugar on new medications. -Extensively discussed pathophysiology of DM, recommended lifestyle interventions, dietary effects on glycemic control -Counseled on s/sx of and management of hypoglycemia -Next A1C anticipated 12/2019.   ASCVD risk - primary prevention in patient with DM. Last LDL is not controlled. ASCVD risk score is not >20% but borderline. Will continue moderate intensity statin for now.  -Continued atorvastatin 20 mg.   HM: UTD on PNA, influenza and tetanus vaccines.   Written patient instructions provided.  Total time in face to face counseling 30 minutes.   Follow up Pharmacist Clinic Visit in 1 month.    Benard Halsted, PharmD, [place] [phone]

## 2019-09-29 NOTE — Telephone Encounter (Signed)
Patient called stating that she was seen by Sharyn Lull yesterday and was expecting a meter,test strips and lancets to be sent in to [facility].   Please advice [phone]  Thank you Whitney Post

## 2019-10-02 ENCOUNTER — Other Ambulatory Visit: Payer: Self-pay

## 2019-10-02 ENCOUNTER — Other Ambulatory Visit (INDEPENDENT_AMBULATORY_CARE_PROVIDER_SITE_OTHER): Payer: Self-pay

## 2019-10-02 DIAGNOSIS — Z1159 Encounter for screening for other viral diseases: Secondary | ICD-10-CM

## 2019-10-02 DIAGNOSIS — R739 Hyperglycemia, unspecified: Secondary | ICD-10-CM

## 2019-10-02 DIAGNOSIS — Z1211 Encounter for screening for malignant neoplasm of colon: Secondary | ICD-10-CM

## 2019-10-02 DIAGNOSIS — Z114 Encounter for screening for human immunodeficiency virus [HIV]: Secondary | ICD-10-CM

## 2019-10-03 LAB — CMP14+EGFR
ALT: 24 IU/L (ref 0–32)
AST: 23 IU/L (ref 0–40)
Albumin/Globulin Ratio: 1.6 (ref 1.2–2.2)
Albumin: 4.2 g/dL (ref 3.8–4.8)
Alkaline Phosphatase: 138 IU/L — ABNORMAL HIGH (ref 39–117)
BUN/Creatinine Ratio: 16 (ref 12–28)
BUN: 17 mg/dL (ref 8–27)
Bilirubin Total: 0.3 mg/dL (ref 0.0–1.2)
CO2: 24 mmol/L (ref 20–29)
Calcium: 10.2 mg/dL (ref 8.7–10.3)
Chloride: 99 mmol/L (ref 96–106)
Creatinine, Ser: 1.05 mg/dL — ABNORMAL HIGH (ref 0.57–1.00)
GFR calc Af Amer: 65 mL/min/{1.73_m2} (ref >59)
GFR calc non Af Amer: 56 mL/min/{1.73_m2} — ABNORMAL LOW (ref >59)
Globulin, Total: 2.6 g/dL (ref 1.5–4.5)
Glucose: 259 mg/dL — ABNORMAL HIGH (ref 65–99)
Potassium: 3.6 mmol/L (ref 3.5–5.2)
Sodium: 143 mmol/L (ref 134–144)
Total Protein: 6.8 g/dL (ref 6.0–8.5)

## 2019-10-03 LAB — CBC WITH DIFFERENTIAL/PLATELET
Basophils Absolute: 0 10*3/uL (ref 0.0–0.2)
Basos: 1 %
EOS (ABSOLUTE): 0.2 10*3/uL (ref 0.0–0.4)
Eos: 2 %
Hematocrit: 43.3 % (ref 34.0–46.6)
Hemoglobin: 14.6 g/dL (ref 11.1–15.9)
Immature Grans (Abs): 0 10*3/uL (ref 0.0–0.1)
Immature Granulocytes: 0 %
Lymphocytes Absolute: 2.8 10*3/uL (ref 0.7–3.1)
Lymphs: 43 %
MCH: 28.8 pg (ref 26.6–33.0)
MCHC: 33.7 g/dL (ref 31.5–35.7)
MCV: 85 fL (ref 79–97)
Monocytes Absolute: 0.6 10*3/uL (ref 0.1–0.9)
Monocytes: 9 %
Neutrophils Absolute: 2.9 10*3/uL (ref 1.4–7.0)
Neutrophils: 45 %
Platelets: 273 10*3/uL (ref 150–450)
RBC: 5.07 x10E6/uL (ref 3.77–5.28)
RDW: 12.8 % (ref 11.7–15.4)
WBC: 6.4 10*3/uL (ref 3.4–10.8)

## 2019-10-03 LAB — HEPATITIS C ANTIBODY: Hep C Virus Ab: <0.1 s/co ratio (ref 0.0–0.9)

## 2019-10-03 LAB — HIV ANTIBODY (ROUTINE TESTING W REFLEX): HIV Screen 4th Generation wRfx: NONREACTIVE

## 2019-10-03 LAB — LIPID PANEL
Chol/HDL Ratio: 2 ratio (ref 0.0–4.4)
Cholesterol, Total: 107 mg/dL (ref 100–199)
HDL: 53 mg/dL (ref >39)
LDL Chol Calc (NIH): 32 mg/dL (ref 0–99)
Triglycerides: 124 mg/dL (ref 0–149)
VLDL Cholesterol Cal: 22 mg/dL (ref 5–40)

## 2019-10-03 LAB — FECAL OCCULT BLOOD, IMMUNOCHEMICAL: Fecal Occult Bld: POSITIVE — AB

## 2019-10-06 ENCOUNTER — Other Ambulatory Visit (INDEPENDENT_AMBULATORY_CARE_PROVIDER_SITE_OTHER): Payer: Self-pay | Admitting: Primary Care

## 2019-10-06 ENCOUNTER — Telehealth (INDEPENDENT_AMBULATORY_CARE_PROVIDER_SITE_OTHER): Payer: Self-pay

## 2019-10-06 DIAGNOSIS — K921 Melena: Secondary | ICD-10-CM

## 2019-10-06 NOTE — Telephone Encounter (Signed)
-----   Message from Kerin Perna, NP sent at 10/06/2019  9:15 AM EDT ----- Hemoccult positive referred to GI other labs normal (essentially)

## 2019-10-06 NOTE — Telephone Encounter (Signed)
Patient is aware that FIT was positive. Referred to GI for colonoscopy. All other labs normal. Patient expressed understanding and did not have any questions. Nat Christen, CMA

## 2019-10-27 ENCOUNTER — Other Ambulatory Visit: Payer: Self-pay | Admitting: Primary Care

## 2019-10-27 DIAGNOSIS — I10 Essential (primary) hypertension: Secondary | ICD-10-CM

## 2019-10-27 MED FILL — LOSARTAN-HCTZ 100-25 MG TAB: 100-25 | 30 days supply | Qty: 30 | Fill #1

## 2019-10-27 MED FILL — ?AMITRIPTYLINE HCL 25 MG TA: 25 | 30 days supply | Qty: 30 | Fill #3

## 2019-10-27 MED FILL — ?METOPROLOL 25 MG TABLET: 25 | 30 days supply | Qty: 60 | Fill #1

## 2019-10-27 MED FILL — ?GLIPIZIDE 10 MG TABLET: 10 | 30 days supply | Qty: 60 | Fill #1

## 2019-10-27 MED FILL — ?ATORVASTATIN 20 MG TABLET: 20 | 30 days supply | Qty: 30 | Fill #0

## 2019-10-27 MED FILL — metFORMIN HCL 1000 MG TABS: 1000 | 30 days supply | Qty: 60 | Fill #1

## 2019-10-30 ENCOUNTER — Encounter: Payer: Self-pay | Admitting: Pharmacist

## 2019-10-30 ENCOUNTER — Other Ambulatory Visit: Payer: Self-pay

## 2019-10-30 ENCOUNTER — Ambulatory Visit: Payer: Self-pay | Attending: Family Medicine | Admitting: Pharmacist

## 2019-10-30 DIAGNOSIS — R739 Hyperglycemia, unspecified: Secondary | ICD-10-CM

## 2019-10-30 MED FILL — TRUEplus LANCETS 28G MISC: 30 days supply | Qty: 100 | Fill #1

## 2019-10-30 MED FILL — TRUE METRIX GLUCOSE TEST ST: 30 days supply | Qty: 100 | Fill #1

## 2019-10-30 NOTE — Progress Notes (Signed)
    S:    PCP: Sharyn Lull   No chief complaint on file.  Patient arrives in good spirits.  Presents for diabetes evaluation, education, and management Patient was referred and last seen by Primary Care Provider on 09/28/19.  I saw her on 09/29/19 to provide education.   Family/Social History:  - No DM hx in family - Current 0.5 PPD smoker  - Denies alcohol use   Insurance coverage/medication affordability: self pay  Patient reports adherence with medications butt takes differently than prescribed.  Current diabetes medications include: metformin 1000 mg BID ( takes 1000 qAM, 500 qPM), glipizide 10 mg BID (takes 57m qAM) Current hypertension medications include: amlodipine 5 mg daily, losartan-HCTZ 100-25 mg daily, metoprolol 25 mg BID Current hyperlipidemia medications include: atorvastatin 20 mg daily  Patient reports hypoglycemic events. Gives readings of 61, 55. Stopped taking her evening glipizide and this has not happened since.    Patient reported dietary habits: - Reports eliminating refined sugars, added sugars, and sugar-sweetened beverages - Limits carbohydrates but does admit to occasionally eating high carb foods such as mac and cheese  Patient-reported exercise habits:  - Denies    Patient denies polyuria but endorses some polydipsia.  Patient denies neuropathy. Patient reports improvement in vision. Patient denies self foot exams.    O:  POCT glucose: 110   Lab Results  Component Value Date   HGBA1C 14.8 (A) 09/28/2019   There were no vitals filed for this visit.  Lipid Panel     Component Value Date/Time   CHOL 107 10/02/2019 0932   TRIG 124 10/02/2019 0932   HDL 53 10/02/2019 0932   CHOLHDL 2.0 10/02/2019 0932   LDLCALC 32 10/02/2019 0932   Home CBG:  Last 2 weeks: 80s - 110s fasting Post-prandial: 100s, max is 154  Clinical ASCVD: No  The ASCVD Risk score (Mikey BussingDC Jr., et al., 2013) failed to calculate for the following reasons:   The valid  total cholesterol range is 130 to 320 mg/dL   A/P: Diabetes longstanding currently uncontrolled but home sugars reveal improvement. Patient is able to verbalize appropriate hypoglycemia management plan. Patient is adherent with medication. Due to hypoglycemia, I recommend to stop evening dose of glipizide and start taking metformin 1000 mg BID. She can continue with AM dose of glipizide for now.  -Continued current medications. -Will have her follow-up in 1 month for evaluation of blood sugar on new medications. -Extensively discussed pathophysiology of DM, recommended lifestyle interventions, dietary effects on glycemic control -Counseled on s/sx of and management of hypoglycemia -Next A1C anticipated 12/2019.   ASCVD risk - primary prevention in patient with DM. Last LDL is controlled. Recommend to continue moderate intensity statin for now.  -Continued atorvastatin 20 mg.   HM: UTD on PNA, influenza and tetanus vaccines.   Written patient instructions provided.  Total time in face to face counseling 30 minutes.   Follow up PCP in Jan.  LBenard Halsted PharmD, [phone]

## 2019-10-30 NOTE — Patient Instructions (Signed)
Thank you for coming to see me today. Please do the following:  1. Try to go back to 1000 mg of metformin twice a day.  2. Only take glipizide in the morning.  3. Continue checking blood sugars at home.  4. Continue making the lifestyle changes we've discussed together during our visit. Diet and exercise play a significant role in improving your blood sugars.  5. Follow-up with you PCP in January.    Hypoglycemia or low blood sugar:   Low blood sugar can happen quickly and may become an emergency if not treated right away.   While this shouldn't happen often, it can be brought upon if you skip a meal or do not eat enough. Also, if your insulin or other diabetes medications are dosed too high, this can cause your blood sugar to go to low.   Warning signs of low blood sugar include: 1. Feeling shaky or dizzy 2. Feeling weak or tired  3. Excessive hunger 4. Feeling anxious or upset  5. Sweating even when you aren't exercising  What to do if I experience low blood sugar? 1. Check your blood sugar with your meter. If lower than 70, proceed to step 2.  2. Treat with 3-4 glucose tablets or 3 packets of regular sugar. If these aren't around, you can try hard candy. Yet another option would be to drink 4 ounces of fruit juice or 6 ounces of REGULAR soda.  3. Re-check your sugar in 15 minutes. If it is still below 70, do what you did in step 2 again. If has come back up, go ahead and eat a snack or small meal at this time.

## 2019-11-07 ENCOUNTER — Other Ambulatory Visit (INDEPENDENT_AMBULATORY_CARE_PROVIDER_SITE_OTHER): Payer: Self-pay | Admitting: Primary Care

## 2019-11-07 DIAGNOSIS — I10 Essential (primary) hypertension: Secondary | ICD-10-CM

## 2019-11-27 ENCOUNTER — Other Ambulatory Visit: Payer: Self-pay | Admitting: Primary Care

## 2019-11-27 DIAGNOSIS — I10 Essential (primary) hypertension: Secondary | ICD-10-CM

## 2019-11-27 DIAGNOSIS — N3281 Overactive bladder: Secondary | ICD-10-CM

## 2019-11-27 MED FILL — metFORMIN HCL 1000 MG TABS: 1000 | 30 days supply | Qty: 60 | Fill #2

## 2019-11-27 MED FILL — ?METOPROLOL 25 MG TABLET: 25 | 30 days supply | Qty: 60 | Fill #2

## 2019-11-27 MED FILL — LOSARTAN-HCTZ 100-25 MG TAB: 100-25 | 30 days supply | Qty: 30 | Fill #2

## 2019-11-27 MED FILL — ?ATORVASTATIN 20 MG TABLET: 20 | 30 days supply | Qty: 30 | Fill #1

## 2019-11-27 NOTE — Telephone Encounter (Signed)
Sent to PCP ?

## 2019-11-28 DIAGNOSIS — H524 Presbyopia: Secondary | ICD-10-CM | POA: Diagnosis not present

## 2019-11-28 DIAGNOSIS — H52203 Unspecified astigmatism, bilateral: Secondary | ICD-10-CM | POA: Diagnosis not present

## 2019-11-28 DIAGNOSIS — E119 Type 2 diabetes mellitus without complications: Secondary | ICD-10-CM | POA: Diagnosis not present

## 2019-11-28 DIAGNOSIS — H2513 Age-related nuclear cataract, bilateral: Secondary | ICD-10-CM | POA: Diagnosis not present

## 2019-11-28 DIAGNOSIS — H25013 Cortical age-related cataract, bilateral: Secondary | ICD-10-CM | POA: Diagnosis not present

## 2019-11-28 LAB — HM DIABETES EYE EXAM

## 2019-11-28 MED FILL — OFLOXACIN 0.3% EYE DROPS: 0.3 | 23 days supply | Qty: 5 | Fill #0

## 2019-11-28 MED FILL — PREDNISOLONE AC 1% EYE DROP: 1 | 21 days supply | Qty: 5 | Fill #0

## 2019-11-28 MED FILL — KETOROLAC 0.5% OPHTH SOLN: 0.5 | 23 days supply | Qty: 3 | Fill #0

## 2019-11-30 MED FILL — ?AMITRIPTYLINE HCL 25 MG TA: 25 | 30 days supply | Qty: 30 | Fill #0

## 2019-11-30 MED FILL — ?HYDROCHLOROTHIAZIDE 25MG T: 25 | 30 days supply | Qty: 30 | Fill #0

## 2019-12-19 MED FILL — TRUE METRIX GLUCOSE TEST ST: 30 days supply | Qty: 100 | Fill #2

## 2019-12-29 ENCOUNTER — Encounter (INDEPENDENT_AMBULATORY_CARE_PROVIDER_SITE_OTHER): Payer: Self-pay | Admitting: Primary Care

## 2019-12-29 ENCOUNTER — Other Ambulatory Visit: Payer: Self-pay

## 2019-12-29 ENCOUNTER — Ambulatory Visit (INDEPENDENT_AMBULATORY_CARE_PROVIDER_SITE_OTHER): Payer: Medicare HMO | Admitting: Primary Care

## 2019-12-29 VITALS — BP 137/83 | HR 99 | Temp 97.0°F | Ht 67.0 in | Wt 232.0 lb

## 2019-12-29 DIAGNOSIS — E119 Type 2 diabetes mellitus without complications: Secondary | ICD-10-CM | POA: Diagnosis not present

## 2019-12-29 DIAGNOSIS — I10 Essential (primary) hypertension: Secondary | ICD-10-CM

## 2019-12-29 DIAGNOSIS — N3281 Overactive bladder: Secondary | ICD-10-CM | POA: Diagnosis not present

## 2019-12-29 DIAGNOSIS — F17200 Nicotine dependence, unspecified, uncomplicated: Secondary | ICD-10-CM | POA: Diagnosis not present

## 2019-12-29 LAB — POCT GLYCOSYLATED HEMOGLOBIN (HGB A1C): HbA1c, POC (controlled diabetic range): 6.7 % (ref 0.0–7.0)

## 2019-12-29 LAB — GLUCOSE, POCT (MANUAL RESULT ENTRY): POC Glucose: 105 mg/dl — AB (ref 70–99)

## 2019-12-29 MED ORDER — LOSARTAN POTASSIUM-HCTZ 100-25 MG PO TABS: 1.0000 | ORAL_TABLET | Freq: Every day | ORAL | 3 refills | Status: DC

## 2019-12-29 MED ORDER — AMLODIPINE BESYLATE 10 MG PO TABS: 10.0000 mg | ORAL_TABLET | Freq: Every day | ORAL | 1 refills | Status: DC

## 2019-12-29 MED ORDER — AMITRIPTYLINE HCL 25 MG PO TABS: 25.0000 mg | ORAL_TABLET | Freq: Every day | ORAL | 1 refills | Status: DC

## 2019-12-29 MED ORDER — HYDROCHLOROTHIAZIDE 25 MG PO TABS: 25.0000 mg | ORAL_TABLET | Freq: Every day | ORAL | 1 refills | Status: DC

## 2019-12-29 MED ORDER — ATORVASTATIN CALCIUM 20 MG PO TABS: 20.0000 mg | ORAL_TABLET | Freq: Every day | ORAL | 1 refills | Status: DC

## 2019-12-29 MED ORDER — METOPROLOL TARTRATE 25 MG PO TABS: 25.0000 mg | ORAL_TABLET | Freq: Two times a day (BID) | ORAL | 1 refills | Status: DC

## 2019-12-29 MED ORDER — METFORMIN HCL 1000 MG PO TABS: 1000.0000 mg | ORAL_TABLET | Freq: Two times a day (BID) | ORAL | 1 refills | Status: DC

## 2019-12-29 NOTE — Patient Instructions (Signed)
Diabetes Mellitus and Exercise Exercising regularly is important for your overall health, especially when you have diabetes (diabetes mellitus). Exercising is not only about losing weight. It has many other health benefits, such as increasing muscle strength and bone density and reducing body fat and stress. This leads to improved fitness, flexibility, and endurance, all of which result in better overall health. Exercise has additional benefits for people with diabetes, including:  Reducing appetite.  Helping to lower and control blood glucose.  Lowering blood pressure.  Helping to control amounts of fatty substances (lipids) in the blood, such as cholesterol and triglycerides.  Helping the body to respond better to insulin (improving insulin sensitivity).  Reducing how much insulin the body needs.  Decreasing the risk for heart disease by: ? Lowering cholesterol and triglyceride levels. ? Increasing the levels of good cholesterol. ? Lowering blood glucose levels. What is my activity plan? Your health care provider or certified diabetes educator can help you make a plan for the type and frequency of exercise (activity plan) that works for you. Make sure that you:  Do at least 150 minutes of moderate-intensity or vigorous-intensity exercise each week. This could be brisk walking, biking, or water aerobics. ? Do stretching and strength exercises, such as yoga or weightlifting, at least 2 times a week. ? Spread out your activity over at least 3 days of the week.  Get some form of physical activity every day. ? Do not go more than 2 days in a row without some kind of physical activity. ? Avoid being inactive for more than 30 minutes at a time. Take frequent breaks to walk or stretch.  Choose a type of exercise or activity that you enjoy, and set realistic goals.  Start slowly, and gradually increase the intensity of your exercise over time. What do I need to know about managing my  diabetes?   Check your blood glucose before and after exercising. ? If your blood glucose is 240 mg/dL (13.3 mmol/L) or higher before you exercise, check your urine for ketones. If you have ketones in your urine, do not exercise until your blood glucose returns to normal. ? If your blood glucose is 100 mg/dL (5.6 mmol/L) or lower, eat a snack containing 15-20 grams of carbohydrate. Check your blood glucose 15 minutes after the snack to make sure that your level is above 100 mg/dL (5.6 mmol/L) before you start your exercise.  Know the symptoms of low blood glucose (hypoglycemia) and how to treat it. Your risk for hypoglycemia increases during and after exercise. Common symptoms of hypoglycemia can include: ? Hunger. ? Anxiety. ? Sweating and feeling clammy. ? Confusion. ? Dizziness or feeling light-headed. ? Increased heart rate or palpitations. ? Blurry vision. ? Tingling or numbness around the mouth, lips, or tongue. ? Tremors or shakes. ? Irritability.  Keep a rapid-acting carbohydrate snack available before, during, and after exercise to help prevent or treat hypoglycemia.  Avoid injecting insulin into areas of the body that are going to be exercised. For example, avoid injecting insulin into: ? The arms, when playing tennis. ? The legs, when jogging.  Keep records of your exercise habits. Doing this can help you and your health care provider adjust your diabetes management plan as needed. Write down: ? Food that you eat before and after you exercise. ? Blood glucose levels before and after you exercise. ? The type and amount of exercise you have done. ? When your insulin is expected to peak, if you use   insulin. Avoid exercising at times when your insulin is peaking.  When you start a new exercise or activity, work with your health care provider to make sure the activity is safe for you, and to adjust your insulin, medicines, or food intake as needed.  Drink plenty of water while  you exercise to prevent dehydration or heat stroke. Drink enough fluid to keep your urine clear or pale yellow. Summary  Exercising regularly is important for your overall health, especially when you have diabetes (diabetes mellitus).  Exercising has many health benefits, such as increasing muscle strength and bone density and reducing body fat and stress.  Your health care provider or certified diabetes educator can help you make a plan for the type and frequency of exercise (activity plan) that works for you.  When you start a new exercise or activity, work with your health care provider to make sure the activity is safe for you, and to adjust your insulin, medicines, or food intake as needed. This information is not intended to replace advice given to you by your health care provider. Make sure you discuss any questions you have with your health care provider. Document Revised: 06/24/2017 Document Reviewed: 05/11/2016 Elsevier Patient Education  2020 [employer]  

## 2019-12-29 NOTE — Progress Notes (Signed)
Established Patient Office Visit  Subjective:  Patient ID: Amber Meyer, female    DOB: 13-Dec-1956  Age: 63 y.o. MRN: [id number]  CC:  Chief Complaint  Patient presents with  . Diabetes    HPI Amber Meyer presents for management of diabetes she is doing excellent A1C 6.7 Today 3 months ago A1C 14.8 she has done a complete lifestyle change. Very very please and proud of Amber Meyer   Past Medical History:  Diagnosis Date  . GERD (gastroesophageal reflux disease)   . Hypertension   . Renal disorder    3 kidney stones  . Sinus bradycardia seen on cardiac monitor   . Urinary frequency     Past Surgical History:  Procedure Laterality Date  . ABDOMINAL HYSTERECTOMY  1981   partial  . Kidney stone removal Left 1999  . KNEE ARTHROPLASTY Right 12/23/2017   Procedure: RIGHT TOTAL KNEE ARTHROPLASTY WITH COMPUTER NAVIGATION;  Surgeon: Rod Can, MD;  Location: [facility];  Service: Orthopedics;  Laterality: Right;  NEEDS RNFA  . KNEE SURGERY Right 2013   cleaned out damaged tissue and arthritis  . LITHOTRIPSY  2003, 2006   x 2    Family History  Problem Relation Age of Onset  . Aneurysm Mother   . Alzheimer's disease Father     Social History   Socioeconomic History  . Marital status: Single    Spouse name: Not on file  . Number of children: Not on file  . Years of education: Not on file  . Highest education level: Not on file  Occupational History  . Not on file  Tobacco Use  . Smoking status: Current Every Day Smoker    Packs/day: 0.50    Types: Cigarettes  . Smokeless tobacco: Never Used  . Tobacco comment: down to 2 cigs a day   Substance and Sexual Activity  . Alcohol use: No  . Drug use: No  . Sexual activity: Not Currently  Other Topics Concern  . Not on file  Social History Narrative  . Not on file   Social Determinants of Health   Financial Resource Strain:   . Difficulty of Paying Living Expenses: Not on file  Food Insecurity:   .  Worried About Charity fundraiser in the Last Year: Not on file  . Ran Out of Food in the Last Year: Not on file  Transportation Needs:   . Lack of Transportation (Medical): Not on file  . Lack of Transportation (Non-Medical): Not on file  Physical Activity:   . Days of Exercise per Week: Not on file  . Minutes of Exercise per Session: Not on file  Stress:   . Feeling of Stress : Not on file  Social Connections:   . Frequency of Communication with Friends and Family: Not on file  . Frequency of Social Gatherings with Friends and Family: Not on file  . Attends Religious Services: Not on file  . Active Member of Clubs or Organizations: Not on file  . Attends Archivist Meetings: Not on file  . Marital Status: Not on file  Intimate Partner Violence:   . Fear of Current or Ex-Partner: Not on file  . Emotionally Abused: Not on file  . Physically Abused: Not on file  . Sexually Abused: Not on file    Outpatient Medications Prior to Visit  Medication Sig Dispense Refill  . albuterol (VENTOLIN HFA) 108 (90 Base) MCG/ACT inhaler Inhale 2 puffs into the lungs every  4 (four) hours as needed for wheezing. 1 Inhaler 12  . Blood Glucose Monitoring Suppl (TRUE METRIX METER) w/Device KIT Use as instructed. 1 kit 0  . co-enzyme Q-10 30 MG capsule Take 30 mg by mouth daily.    . diphenhydrAMINE (BENADRYL ALLERGY) 25 MG tablet Take 1 tablet (25 mg total) by mouth every 6 (six) hours as needed. (Patient taking differently: Take 25 mg by mouth every 6 (six) hours as needed for itching. ) 30 tablet 0  . docusate sodium (COLACE) 100 MG capsule Take 1 capsule (100 mg total) by mouth 2 (two) times daily. 60 capsule 1  . esomeprazole (NEXIUM) 40 MG capsule Take 1 capsule (40 mg total) by mouth daily at 12 noon. 30 capsule 2  . glucose blood (TRUE METRIX BLOOD GLUCOSE TEST) test strip Use as instructed 100 each 12  . loratadine (CLARITIN) 10 MG tablet Take 1 tablet (10 mg total) by mouth daily. 30  tablet 11  . Multiple Vitamins-Calcium (ONE-A-DAY WOMENS PO) Take 1 tablet by mouth daily.    Marland Kitchen tolterodine (DETROL) 1 MG tablet Take 1 tablet (1 mg total) by mouth 2 (two) times daily. 60 tablet 3  . TRUEplus Lancets 28G MISC Use as instructed. 100 each 11  . amitriptyline (ELAVIL) 25 MG tablet TAKE 1 TABLET (25 MG TOTAL) BY MOUTH AT BEDTIME. 30 tablet 3  . amLODipine (NORVASC) 5 MG tablet TAKE 2 TABLETS (10 MG TOTAL) BY MOUTH DAILY. 60 tablet 2  . atorvastatin (LIPITOR) 20 MG tablet TAKE 1 TABLET (20 MG TOTAL) BY MOUTH DAILY. 30 tablet 2  . hydrochlorothiazide (HYDRODIURIL) 25 MG tablet TAKE 1 TABLET (25 MG TOTAL) BY MOUTH DAILY. 30 tablet 0  . losartan-hydrochlorothiazide (HYZAAR) 100-25 MG tablet Take 1 tablet by mouth daily. 90 tablet 3  . metFORMIN (GLUCOPHAGE) 1000 MG tablet Take 1 tablet (1,000 mg total) by mouth 2 (two) times daily with a meal. 180 tablet 1  . metoprolol tartrate (LOPRESSOR) 25 MG tablet Take 1 tablet (25 mg total) by mouth 2 (two) times daily. 180 tablet 1  . glipiZIDE (GLUCOTROL) 10 MG tablet Take 1 tablet (10 mg total) by mouth 2 (two) times daily before a meal. (Patient not taking: Reported on 12/29/2019) 180 tablet 1   No facility-administered medications prior to visit.    Allergies  Allergen Reactions  . Hydrocodone-Acetaminophen Hives and Other (See Comments)    Can take with Benadryl     ROS Review of Systems  Gastrointestinal: Positive for diarrhea.       From metformin  All other systems reviewed and are negative.     Objective:    Physical Exam  Constitutional: She is oriented to person, place, and time. She appears well-developed and well-nourished.  obesed  Cardiovascular: Normal rate and regular rhythm.  Pulmonary/Chest: Effort normal and breath sounds normal.  Abdominal: Soft. Bowel sounds are normal.  Musculoskeletal:        General: Normal range of motion.     Cervical back: Normal range of motion and neck supple.  Neurological: She  is oriented to person, place, and time.  Skin: Skin is warm and dry.  Psychiatric: She has a normal mood and affect. Her behavior is normal. Thought content normal.    BP 137/83   Pulse 99   Temp (!) 97 F (36.1 C) (Oral)   Ht 5' 7"  (1.702 m)   Wt 232 lb (105.2 kg)   SpO2 97%   BMI 36.34 kg/m  Wt Readings  from Last 3 Encounters:  12/29/19 232 lb (105.2 kg)  09/28/19 225 lb 6.4 oz (102.2 kg)  02/16/18 203 lb (92.1 kg)     Health Maintenance Due  Topic Date Due  . PAP SMEAR-Modifier  12/08/1975  . MAMMOGRAM  12/07/2004  . COLONOSCOPY  12/07/2004  . DEXA SCAN  12/08/2019    There are no preventive care reminders to display for this patient.  Lab Results  Component Value Date   TSH 0.597 02/16/2018   Lab Results  Component Value Date   WBC 6.4 10/02/2019   HGB 14.6 10/02/2019   HCT 43.3 10/02/2019   MCV 85 10/02/2019   PLT 273 10/02/2019   Lab Results  Component Value Date   NA 143 10/02/2019   K 3.6 10/02/2019   CO2 24 10/02/2019   GLUCOSE 259 (H) 10/02/2019   BUN 17 10/02/2019   CREATININE 1.05 (H) 10/02/2019   BILITOT 0.3 10/02/2019   ALKPHOS 138 (H) 10/02/2019   AST 23 10/02/2019   ALT 24 10/02/2019   PROT 6.8 10/02/2019   ALBUMIN 4.2 10/02/2019   CALCIUM 10.2 10/02/2019   ANIONGAP 14 09/22/2019   Lab Results  Component Value Date   CHOL 107 10/02/2019   Lab Results  Component Value Date   HDL 53 10/02/2019   Lab Results  Component Value Date   LDLCALC 32 10/02/2019   Lab Results  Component Value Date   TRIG 124 10/02/2019   Lab Results  Component Value Date   CHOLHDL 2.0 10/02/2019   Lab Results  Component Value Date   HGBA1C 6.7 12/29/2019      Assessment & Plan:  [name] was seen today for diabetes.  Diagnoses and all orders for this visit:  Type 2 diabetes mellitus without complication, without long-term current use of insulin (HCC) Discontinued glipizide 66m bid agree with patient blood sugars were dropping with life  style modification she completely stop eating all carbs. -     Glucose (CBG) -     HgB A1c 6.7   Tobacco dependence  She is aware of the increased risk for lung cancer and other respiratory diseases recommend cessation.  This will be reminded at each clinical visit.  Hypertension, unspecified type Blood pressure slightly elevated she was fasting in expectations of labs she will continue  Hyzaar  -     metoprolol tartrate (LOPRESSOR) 25 MG tablet; Take 1 tablet (25 mg total) by mouth 2 (two) times daily. -     amLODipine (NORVASC) 10 MG tablet; Take 1 tablet (10 mg total) by mouth daily. -     hydrochlorothiazide (HYDRODIURIL) 25 MG tablet; Take 1 tablet (25 mg total) by mouth daily.  Overactive bladder -     amitriptyline (ELAVIL) 25 MG tablet; Take 1 tablet (25 mg total) by mouth at bedtime.  Other orders -     metFORMIN (GLUCOPHAGE) 1000 MG tablet; Take 1 tablet (1,000 mg total) by mouth 2 (two) times daily with a meal. -     losartan-hydrochlorothiazide (HYZAAR) 100-25 MG tablet; Take 1 tablet by mouth daily. -     atorvastatin (LIPITOR) 20 MG tablet; Take 1 tablet (20 mg total) by mouth daily.    Meds ordered this encounter  Medications  . metFORMIN (GLUCOPHAGE) 1000 MG tablet    Sig: Take 1 tablet (1,000 mg total) by mouth 2 (two) times daily with a meal.    Dispense:  180 tablet    Refill:  1  . losartan-hydrochlorothiazide (HYZAAR) 100-25  MG tablet    Sig: Take 1 tablet by mouth daily.    Dispense:  90 tablet    Refill:  3  . metoprolol tartrate (LOPRESSOR) 25 MG tablet    Sig: Take 1 tablet (25 mg total) by mouth 2 (two) times daily.    Dispense:  180 tablet    Refill:  1  . amLODipine (NORVASC) 10 MG tablet    Sig: Take 1 tablet (10 mg total) by mouth daily.    Dispense:  90 tablet    Refill:  1  . atorvastatin (LIPITOR) 20 MG tablet    Sig: Take 1 tablet (20 mg total) by mouth daily.    Dispense:  90 tablet    Refill:  1  . hydrochlorothiazide (HYDRODIURIL) 25  MG tablet    Sig: Take 1 tablet (25 mg total) by mouth daily.    Dispense:  90 tablet    Refill:  1  . amitriptyline (ELAVIL) 25 MG tablet    Sig: Take 1 tablet (25 mg total) by mouth at bedtime.    Dispense:  90 tablet    Refill:  1    Follow-up: Return in about 6 months (around 06/27/2020) for in person DM.HTN and fasting labs (AM).    Kerin Perna, NP

## 2019-12-29 NOTE — Progress Notes (Signed)
Patient states that sugars have been running normal since she stop taking glipizide 2 weeks ago.

## 2020-01-16 ENCOUNTER — Telehealth (INDEPENDENT_AMBULATORY_CARE_PROVIDER_SITE_OTHER): Payer: Self-pay

## 2020-01-16 NOTE — Telephone Encounter (Signed)
By all meds keep COVID vaccine schedule and have it done

## 2020-01-16 NOTE — Telephone Encounter (Signed)
Patient called stating that she was put on the wait list for the covid-19 vaccine for Saturday Feb 6 but was advice to call her PCP to an ask if it was okay for her to get the vaccine since she is a Diabetic and has High Blood Pressure.   Please advice [phone]

## 2020-01-16 NOTE — Telephone Encounter (Signed)
Please advise 

## 2020-01-17 NOTE — Telephone Encounter (Signed)
Patient is aware to keep scheduled appointment to receive COVID vaccine.

## 2020-01-23 IMAGING — CT CT HEAD W/O CM
3 series · 16 of 47 positions shown, 19 images · non-contrast
Comparison: None.

CLINICAL DATA: Dizziness and blurred vision

EXAM:
CT HEAD WITHOUT CONTRAST
TECHNIQUE: Contiguous axial images were obtained from the base of the skull
through the vertex without intravenous contrast.

[Series 2: head wo · axial · 0.47mm/px · z∈[-155,-30]mm · 10 of 31 slices shown, 13 images]
[im 3/31  brain]
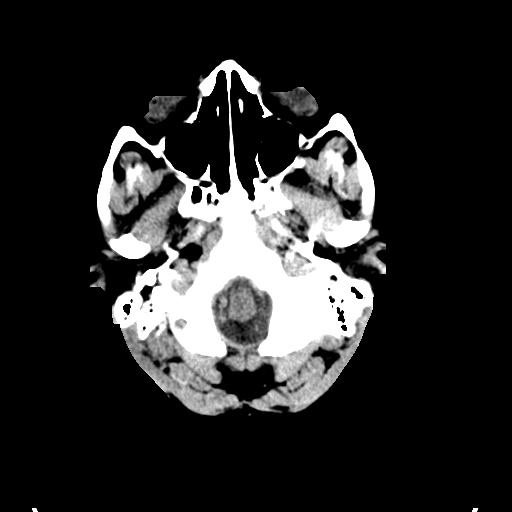
[im 3/31  bone]
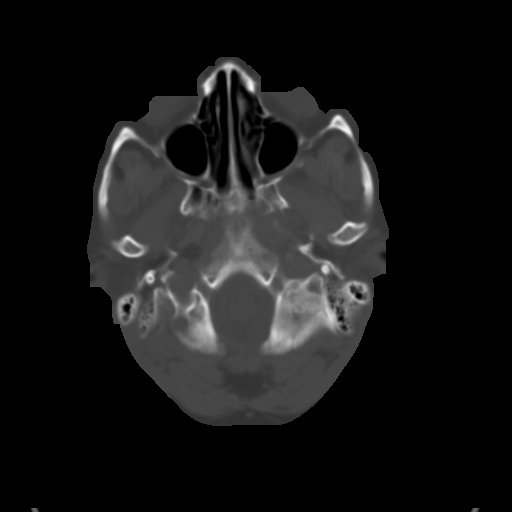
[im 6/31  brain]
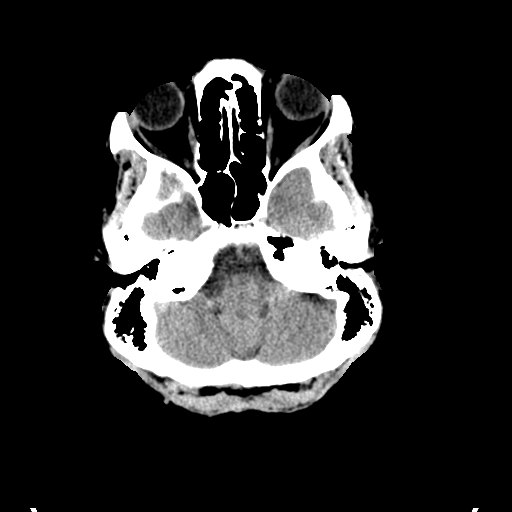
[im 9/31  brain]
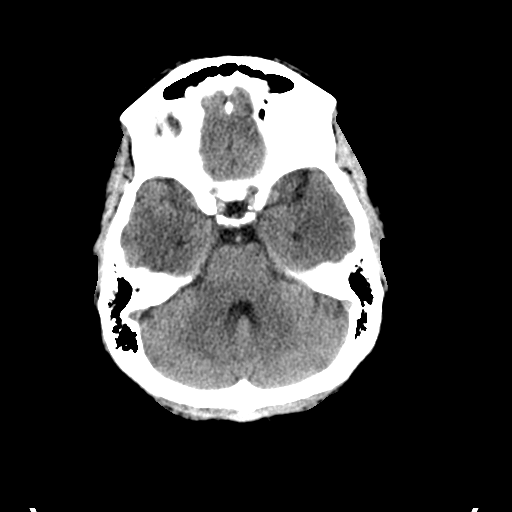
[im 11/31  brain]
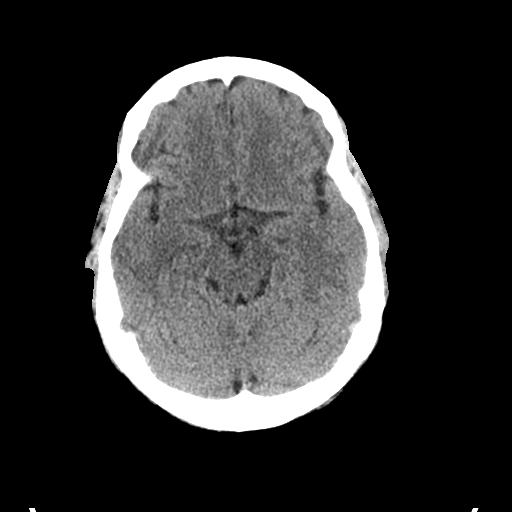
[im 14/31  brain]
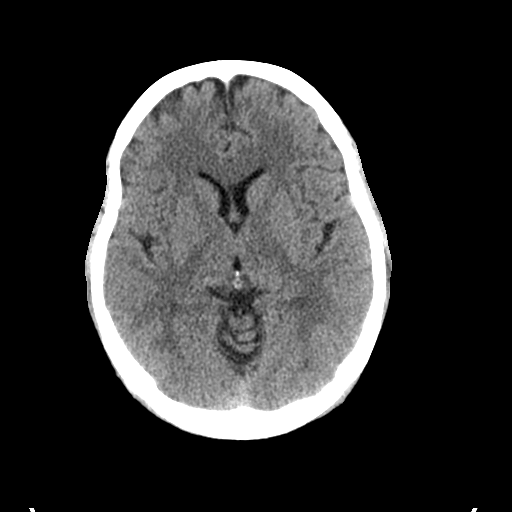
[im 14/31  bone]
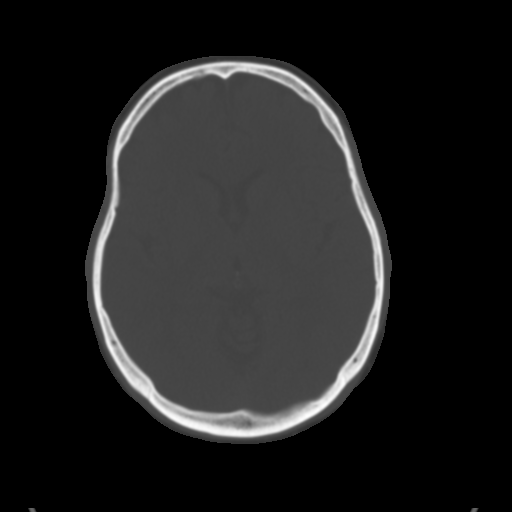
[im 17/31  brain]
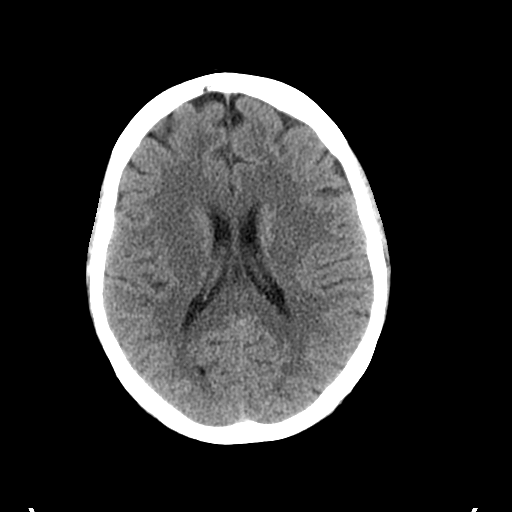
[im 20/31  brain]
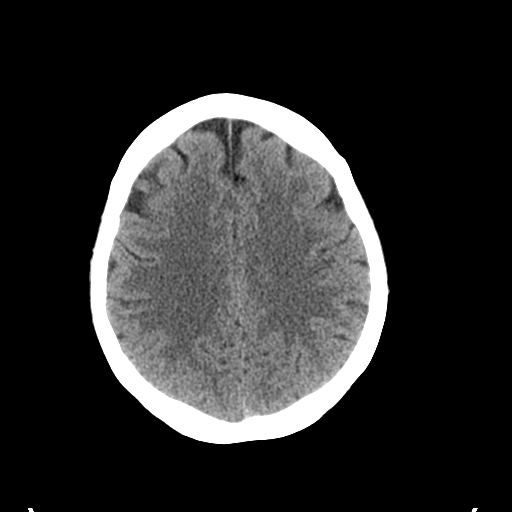
[im 23/31  brain]
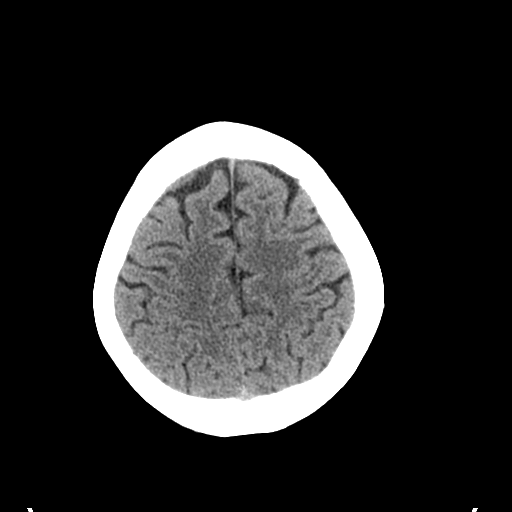
[im 25/31  brain]
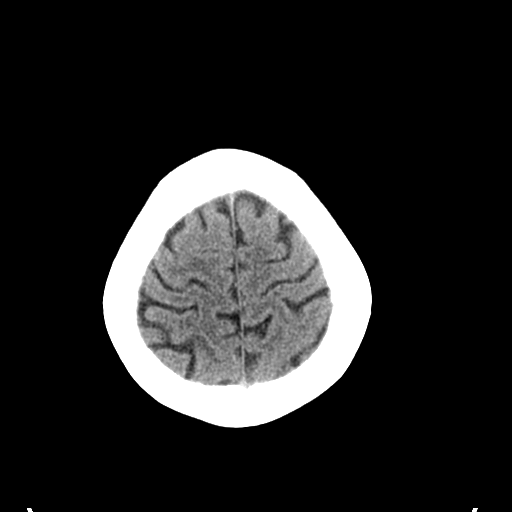
[im 25/31  bone]
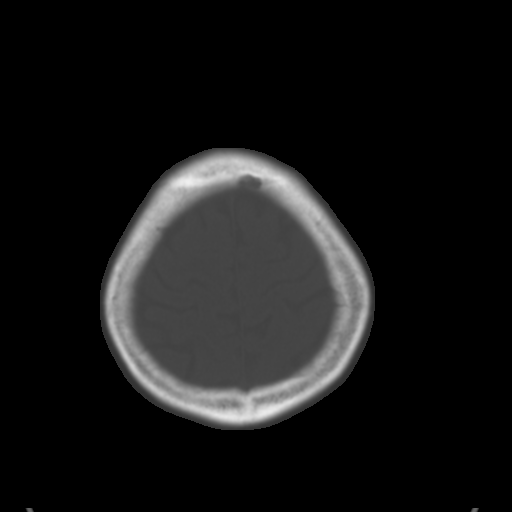
[im 28/31  brain]
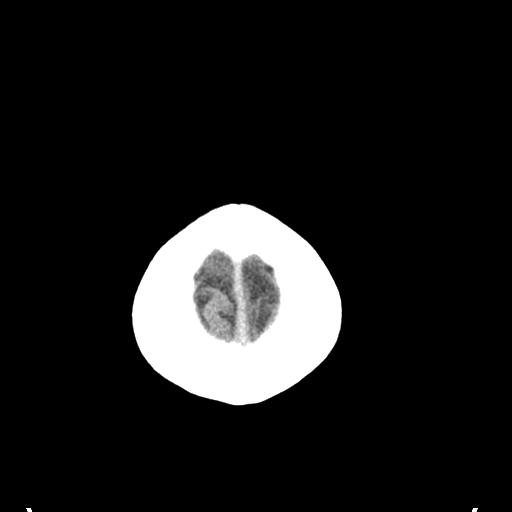

[Series 4: coronal soft tissue · coronal · 0.30mm/px · 3 of 61 slices shown]
[im 21/61  brain]
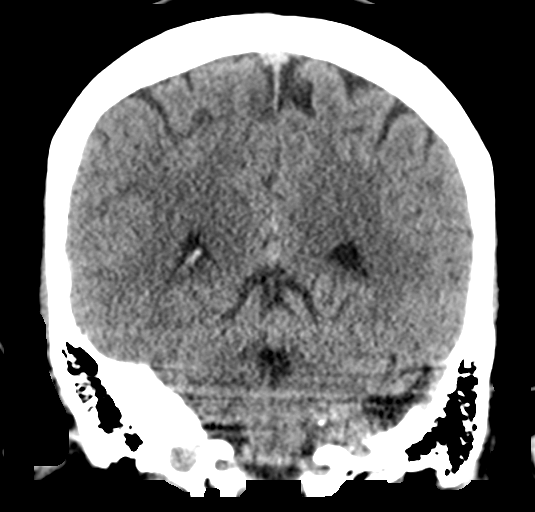
[im 27/61  brain]
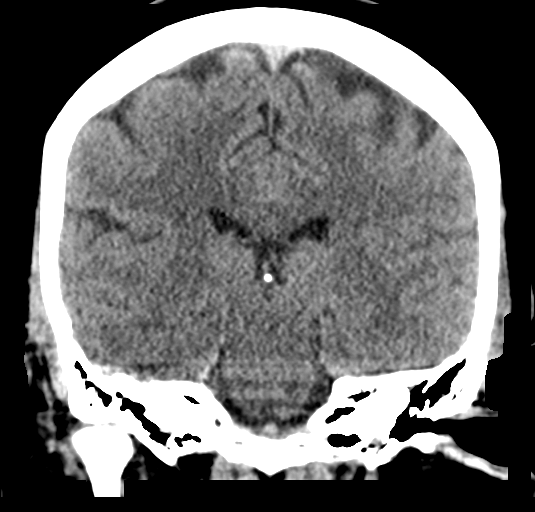
[im 34/61  brain]
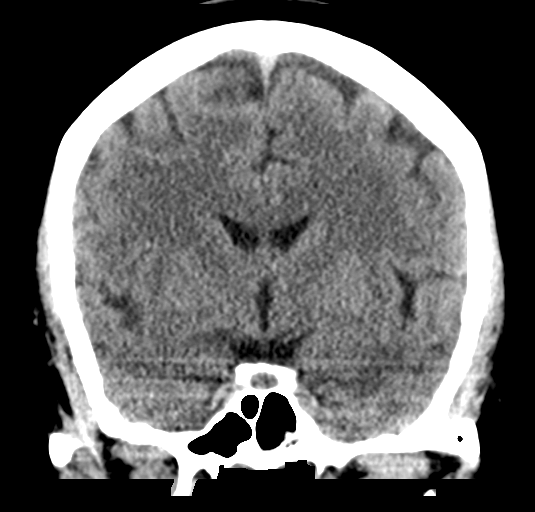

[Series 5: sagittal soft tissue · sagittal · 0.30mm/px · 3 of 50 slices shown]
[im 17/50  brain]
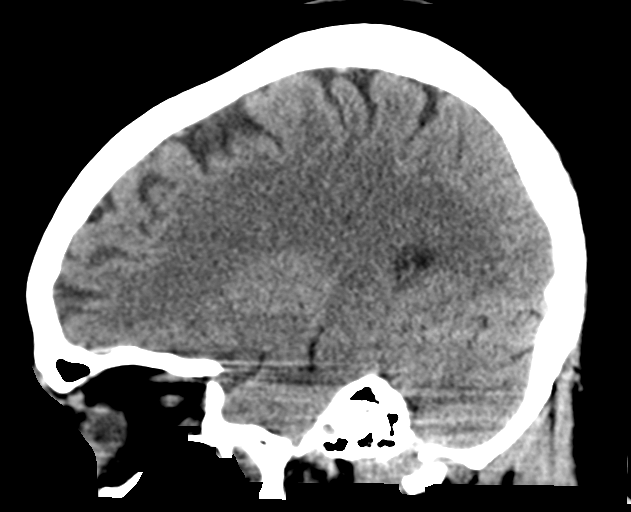
[im 25/50  brain]
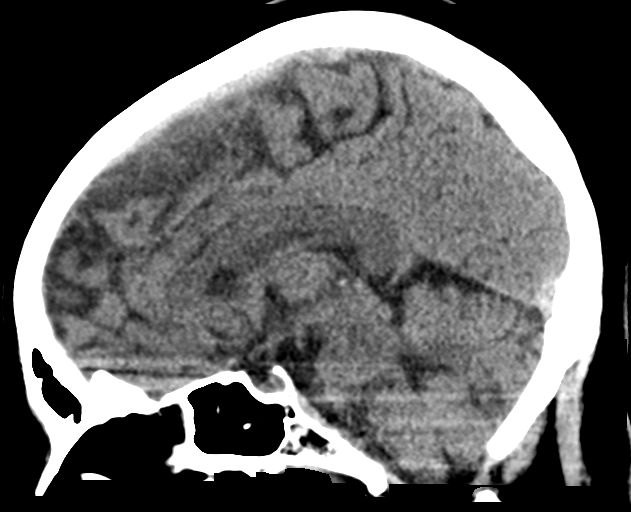
[im 33/50  brain]
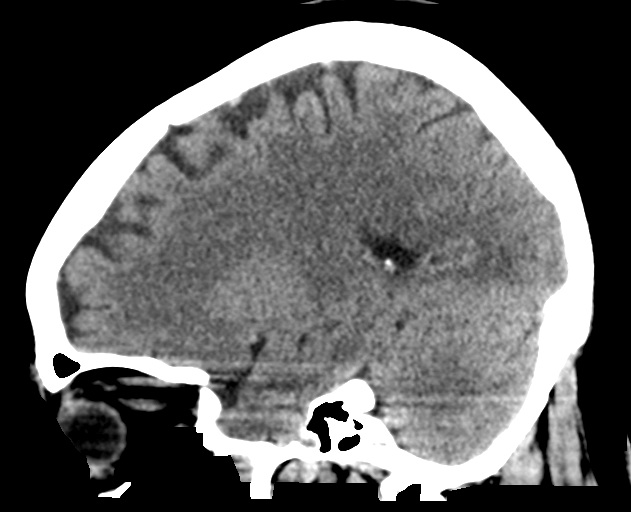

[16 of 47 positions shown; findings below may reference images not displayed]

FINDINGS: Brain: No evidence of acute territorial infarction, hemorrhage,
hydrocephalus,extra-axial collection or mass lesion/mass effect.
Normal gray-white differentiation. Ventricles are normal in size and
contour.

Vascular: No hyperdense vessel or unexpected calcification.

Skull: The skull is intact. No fracture or focal lesion identified.

Sinuses/Orbits: The visualized paranasal sinuses and mastoid air
cells are clear. The orbits and globes intact.

Other: None
IMPRESSION: No acute intracranial abnormality.

## 2020-02-26 ENCOUNTER — Telehealth (INDEPENDENT_AMBULATORY_CARE_PROVIDER_SITE_OTHER): Payer: Self-pay

## 2020-02-26 NOTE — Telephone Encounter (Signed)
Sent to PCP ?

## 2020-02-26 NOTE — Telephone Encounter (Signed)
Patient called to make a medication refill for   esomeprazole (NEXIUM) 40 MG capsule  Patient would like a 90 day supply.  Patient uses  [facility] on [facility]  Please advice [phone]

## 2020-03-20 ENCOUNTER — Other Ambulatory Visit: Payer: Self-pay | Admitting: Pharmacist

## 2020-03-20 MED ORDER — ESOMEPRAZOLE MAGNESIUM 40 MG PO CPDR: 40.0000 mg | DELAYED_RELEASE_CAPSULE | Freq: Every day | ORAL | 1 refills | Status: DC

## 2020-04-18 ENCOUNTER — Telehealth (INDEPENDENT_AMBULATORY_CARE_PROVIDER_SITE_OTHER): Payer: Self-pay

## 2020-04-18 NOTE — Telephone Encounter (Signed)
Patient called to request MYRBETRIQ 50 MG TB24 tablet  States that this medication really helped her. Patient also states that they had switched her a generic brand because she had lost her insurance but now she has [employer].  Patient would like to use [place] [place]

## 2020-04-18 NOTE — Telephone Encounter (Signed)
Patient would need an appointment to evaluate and discuss need for Myrbetriq. Medication is not in her current medication list. Not sure who was prescribing it for her.

## 2020-04-24 ENCOUNTER — Telehealth (INDEPENDENT_AMBULATORY_CARE_PROVIDER_SITE_OTHER): Payer: Self-pay

## 2020-04-24 NOTE — Telephone Encounter (Signed)
Patient called to make a medication refill for  tolterodine (DETROL) 1 MG tablet    Patient uses  [facility] on [address]  Please advice [phone]

## 2020-04-24 NOTE — Telephone Encounter (Signed)
Will forward to pcp

## 2020-04-29 ENCOUNTER — Other Ambulatory Visit (INDEPENDENT_AMBULATORY_CARE_PROVIDER_SITE_OTHER): Payer: Self-pay | Admitting: Primary Care

## 2020-04-29 MED ORDER — TOLTERODINE TARTRATE 1 MG PO TABS: 1.0000 mg | ORAL_TABLET | Freq: Two times a day (BID) | ORAL | 3 refills | Status: DC

## 2020-05-01 ENCOUNTER — Ambulatory Visit (INDEPENDENT_AMBULATORY_CARE_PROVIDER_SITE_OTHER): Payer: Medicare HMO | Admitting: Primary Care

## 2020-05-01 ENCOUNTER — Encounter (INDEPENDENT_AMBULATORY_CARE_PROVIDER_SITE_OTHER): Payer: Self-pay | Admitting: Primary Care

## 2020-05-01 ENCOUNTER — Other Ambulatory Visit: Payer: Self-pay

## 2020-05-01 VITALS — BP 133/75 | HR 103 | Temp 97.5°F | Ht 67.0 in | Wt 230.4 lb

## 2020-05-01 DIAGNOSIS — G4700 Insomnia, unspecified: Secondary | ICD-10-CM

## 2020-05-01 DIAGNOSIS — Z1211 Encounter for screening for malignant neoplasm of colon: Secondary | ICD-10-CM

## 2020-05-01 DIAGNOSIS — N3281 Overactive bladder: Secondary | ICD-10-CM | POA: Diagnosis not present

## 2020-05-01 DIAGNOSIS — I10 Essential (primary) hypertension: Secondary | ICD-10-CM | POA: Diagnosis not present

## 2020-05-01 DIAGNOSIS — E119 Type 2 diabetes mellitus without complications: Secondary | ICD-10-CM

## 2020-05-01 LAB — POCT GLYCOSYLATED HEMOGLOBIN (HGB A1C): Hemoglobin A1C: 5.8 % — AB (ref 4.0–5.6)

## 2020-05-01 MED ORDER — ATORVASTATIN CALCIUM 20 MG PO TABS: 20.0000 mg | ORAL_TABLET | Freq: Every day | ORAL | 1 refills | Status: DC

## 2020-05-01 MED ORDER — LOSARTAN POTASSIUM-HCTZ 100-25 MG PO TABS: 1.0000 | ORAL_TABLET | Freq: Every day | ORAL | 3 refills | Status: DC

## 2020-05-01 MED ORDER — MIRABEGRON ER 50 MG PO TB24: 50.0000 mg | ORAL_TABLET | Freq: Every day | ORAL | 1 refills | Status: DC

## 2020-05-01 MED ORDER — METOPROLOL TARTRATE 25 MG PO TABS: 25.0000 mg | ORAL_TABLET | Freq: Two times a day (BID) | ORAL | 1 refills | Status: DC

## 2020-05-01 MED ORDER — ALBUTEROL SULFATE HFA 108 (90 BASE) MCG/ACT IN AERS: 2.0000 | INHALATION_SPRAY | RESPIRATORY_TRACT | 2 refills | Status: DC | PRN

## 2020-05-01 MED ORDER — AMLODIPINE BESYLATE 10 MG PO TABS: 10.0000 mg | ORAL_TABLET | Freq: Every day | ORAL | 1 refills | Status: DC

## 2020-05-01 MED ORDER — CETIRIZINE HCL 10 MG PO TABS
10.0000 mg | ORAL_TABLET | Freq: Every day | ORAL | 11 refills | Status: DC
Start: 2020-05-01 — End: 2021-06-09

## 2020-05-01 MED ORDER — MIRABEGRON ER 50 MG PO TB24: 50.0000 mg | ORAL_TABLET | Freq: Every day | ORAL | 0 refills | Status: DC

## 2020-05-01 MED ORDER — METFORMIN HCL 1000 MG PO TABS: 1000.0000 mg | ORAL_TABLET | Freq: Two times a day (BID) | ORAL | 1 refills | Status: DC

## 2020-05-01 MED ORDER — AMITRIPTYLINE HCL 25 MG PO TABS: 25.0000 mg | ORAL_TABLET | Freq: Every day | ORAL | 1 refills | Status: DC

## 2020-05-01 MED ORDER — HYDROCHLOROTHIAZIDE 25 MG PO TABS: 25.0000 mg | ORAL_TABLET | Freq: Every day | ORAL | 1 refills | Status: DC

## 2020-05-01 NOTE — Progress Notes (Signed)
Established Patient Office Visit  Subjective:  Patient ID: Amber Meyer, female    DOB: [birth year]  Age: 64 y.o. MRN: [id number]  CC:  Chief Complaint  Patient presents with  . overactive bladder    detrol not helping   . Medication Refill    HPI Amber Meyer is a 64 year old African American female presents to day for follow up on hypertension - Denies shortness of breath, headaches, chest pain or lower extremity edema, sudden onset, vision changes, unilateral weakness, dizziness, paresthesias. Allergies and overactive bladder medication is not working as well. Patient is aware she is on diuretics.   Past Medical History:  Diagnosis Date  . GERD (gastroesophageal reflux disease)   . Hypertension   . Renal disorder    3 kidney stones  . Sinus bradycardia seen on cardiac monitor   . Urinary frequency     Past Surgical History:  Procedure Laterality Date  . ABDOMINAL HYSTERECTOMY  1981   partial  . Kidney stone removal Left 1999  . KNEE ARTHROPLASTY Right 12/23/2017   Procedure: RIGHT TOTAL KNEE ARTHROPLASTY WITH COMPUTER NAVIGATION;  Surgeon: Rod Can, MD;  Location: [facility];  Service: Orthopedics;  Laterality: Right;  NEEDS RNFA  . KNEE SURGERY Right 2013   cleaned out damaged tissue and arthritis  . LITHOTRIPSY  2003, 2006   x 2    Family History  Problem Relation Age of Onset  . Aneurysm Mother   . Alzheimer's disease Father     Social History   Socioeconomic History  . Marital status: Single    Spouse name: Not on file  . Number of children: Not on file  . Years of education: Not on file  . Highest education level: Not on file  Occupational History  . Not on file  Tobacco Use  . Smoking status: Current Every Day Smoker    Packs/day: 0.50    Types: Cigarettes  . Smokeless tobacco: Never Used  . Tobacco comment: down to 2 cigs a day   Substance and Sexual Activity  . Alcohol use: No  . Drug use: No  . Sexual activity: Not  Currently  Other Topics Concern  . Not on file  Social History Narrative  . Not on file   Social Determinants of Health   Financial Resource Strain:   . Difficulty of Paying Living Expenses:   Food Insecurity:   . Worried About Charity fundraiser in the Last Year:   . Arboriculturist in the Last Year:   Transportation Needs:   . Film/video editor (Medical):   Marland Kitchen Lack of Transportation (Non-Medical):   Physical Activity:   . Days of Exercise per Week:   . Minutes of Exercise per Session:   Stress:   . Feeling of Stress :   Social Connections:   . Frequency of Communication with Friends and Family:   . Frequency of Social Gatherings with Friends and Family:   . Attends Religious Services:   . Active Member of Clubs or Organizations:   . Attends Archivist Meetings:   Marland Kitchen Marital Status:   Intimate Partner Violence:   . Fear of Current or Ex-Partner:   . Emotionally Abused:   Marland Kitchen Physically Abused:   . Sexually Abused:     Outpatient Medications Prior to Visit  Medication Sig Dispense Refill  . Blood Glucose Monitoring Suppl (TRUE METRIX METER) w/Device KIT Use as instructed. 1 kit 0  .  co-enzyme Q-10 30 MG capsule Take 30 mg by mouth daily.    Marland Kitchen esomeprazole (NEXIUM) 40 MG capsule Take 1 capsule (40 mg total) by mouth daily at 12 noon. 90 capsule 1  . glucose blood (TRUE METRIX BLOOD GLUCOSE TEST) test strip Use as instructed 100 each 12  . Multiple Vitamins-Calcium (ONE-A-DAY WOMENS PO) Take 1 tablet by mouth daily.    . TRUEplus Lancets 28G MISC Use as instructed. 100 each 11  . albuterol (VENTOLIN HFA) 108 (90 Base) MCG/ACT inhaler Inhale 2 puffs into the lungs every 4 (four) hours as needed for wheezing. 1 Inhaler 12  . amitriptyline (ELAVIL) 25 MG tablet Take 1 tablet (25 mg total) by mouth at bedtime. 90 tablet 1  . amLODipine (NORVASC) 10 MG tablet Take 1 tablet (10 mg total) by mouth daily. 90 tablet 1  . atorvastatin (LIPITOR) 20 MG tablet Take 1 tablet  (20 mg total) by mouth daily. 90 tablet 1  . hydrochlorothiazide (HYDRODIURIL) 25 MG tablet Take 1 tablet (25 mg total) by mouth daily. 90 tablet 1  . losartan-hydrochlorothiazide (HYZAAR) 100-25 MG tablet Take 1 tablet by mouth daily. 90 tablet 3  . metFORMIN (GLUCOPHAGE) 1000 MG tablet Take 1 tablet (1,000 mg total) by mouth 2 (two) times daily with a meal. 180 tablet 1  . metoprolol tartrate (LOPRESSOR) 25 MG tablet Take 1 tablet (25 mg total) by mouth 2 (two) times daily. 180 tablet 1  . tolterodine (DETROL) 1 MG tablet Take 1 tablet (1 mg total) by mouth 2 (two) times daily. 60 tablet 3  . diphenhydrAMINE (BENADRYL ALLERGY) 25 MG tablet Take 1 tablet (25 mg total) by mouth every 6 (six) hours as needed. (Patient taking differently: Take 25 mg by mouth every 6 (six) hours as needed for itching. ) 30 tablet 0  . docusate sodium (COLACE) 100 MG capsule Take 1 capsule (100 mg total) by mouth 2 (two) times daily. 60 capsule 1  . loratadine (CLARITIN) 10 MG tablet Take 1 tablet (10 mg total) by mouth daily. 30 tablet 11   No facility-administered medications prior to visit.    Allergies  Allergen Reactions  . Hydrocodone-Acetaminophen Hives and Other (See Comments)    Can take with Benadryl     ROS Review of Systems  All other systems reviewed and are negative.     Objective:    Physical Exam  Constitutional: She is oriented to person, place, and time. She appears well-developed and well-nourished.  HENT:  Head: Normocephalic.  Eyes: Pupils are equal, round, and reactive to light. EOM are normal.  Cardiovascular: Normal rate and regular rhythm.  Pulmonary/Chest: Effort normal and breath sounds normal.  Abdominal: Soft. Bowel sounds are normal.  Musculoskeletal:        General: Normal range of motion.     Cervical back: Normal range of motion and neck supple.  Neurological: She is oriented to person, place, and time.  Skin: Skin is warm and dry.  Psychiatric: She has a normal  mood and affect. Her behavior is normal. Judgment and thought content normal.    BP 133/75 (BP Location: Left Arm, Patient Position: Sitting, Cuff Size: Large)   Pulse (!) 103   Temp (!) 97.5 F (36.4 C) (Temporal)   Ht 5' 7"  (1.702 m)   Wt 230 lb 6.4 oz (104.5 kg)   SpO2 99%   BMI 36.09 kg/m  Wt Readings from Last 3 Encounters:  05/01/20 230 lb 6.4 oz (104.5 kg)  12/29/19 232  lb (105.2 kg)  09/28/19 225 lb 6.4 oz (102.2 kg)     Health Maintenance Due  Topic Date Due  . PAP SMEAR-Modifier  Never done  . MAMMOGRAM  Never done  . COLONOSCOPY  Never done  . DEXA SCAN  Never done    There are no preventive care reminders to display for this patient.  Lab Results  Component Value Date   TSH 0.597 02/16/2018   Lab Results  Component Value Date   WBC 6.4 10/02/2019   HGB 14.6 10/02/2019   HCT 43.3 10/02/2019   MCV 85 10/02/2019   PLT 273 10/02/2019   Lab Results  Component Value Date   NA 143 10/02/2019   K 3.6 10/02/2019   CO2 24 10/02/2019   GLUCOSE 259 (H) 10/02/2019   BUN 17 10/02/2019   CREATININE 1.05 (H) 10/02/2019   BILITOT 0.3 10/02/2019   ALKPHOS 138 (H) 10/02/2019   AST 23 10/02/2019   ALT 24 10/02/2019   PROT 6.8 10/02/2019   ALBUMIN 4.2 10/02/2019   CALCIUM 10.2 10/02/2019   ANIONGAP 14 09/22/2019   Lab Results  Component Value Date   CHOL 107 10/02/2019   Lab Results  Component Value Date   HDL 53 10/02/2019   Lab Results  Component Value Date   LDLCALC 32 10/02/2019   Lab Results  Component Value Date   TRIG 124 10/02/2019   Lab Results  Component Value Date   CHOLHDL 2.0 10/02/2019   Lab Results  Component Value Date   HGBA1C 5.8 (A) 05/01/2020      Assessment & Plan:  [name] was seen today for overactive bladder and medication refill.  Diagnoses and all orders for this visit:  Overactive bladder -     mirabegron ER (MYRBETRIQ) 50 MG TB24 tablet; Take 1 tablet (50 mg total) by mouth daily. -    Essential  hypertension   metoprolol tartrate (LOPRESSOR) 25 MG tablet; Take 1 tablet (25 mg total) by mouth 2 (two) times daily. -     amLODipine (NORVASC) 10 MG tablet; Take 1 tablet (10 mg total) by mouth daily. -     hydrochlorothiazide (HYDRODIURIL) 25 MG tablet; Take 1 tablet (25 mg total) by mouth daily. Losartan/HCTZ 100/25 take 1 tablet daily   Type 2 diabetes mellitus without complication, without long-term current use of insulin ([facility]) Well controlled on metformin  -     HgB A1c -     Lipid Panel -     CBC with Differential   Insomnia, unspecified type Mange with amitriptyline . Discussed insomnia causes place on AVS for review   amitriptyline (ELAVIL) 25 MG tablet; Take 1 tablet (25 mg total) by mouth at bedtime.  Other orders -     mirabegron ER (MYRBETRIQ) 50 MG TB24 tablet; Take 1 tablet (50 mg total) by mouth daily. -     atorvastatin (LIPITOR) 20 MG tablet; Take 1 tablet (20 mg total) by mouth daily. -     albuterol (VENTOLIN HFA) 108 (90 Base) MCG/ACT inhaler; Inhale 2 puffs into the lungs every 4 (four) hours as needed for wheezing. -     metFORMIN (GLUCOPHAGE) 1000 MG tablet; Take 1 tablet (1,000 mg total) by mouth 2 (two) times daily with a meal. -     losartan-hydrochlorothiazide (HYZAAR) 100-25 MG tablet; Take 1 tablet by mouth daily. -     cetirizine (ZYRTEC) 10 MG tablet; Take 1 tablet (10 mg total) by mouth daily.    Meds ordered  this encounter  Medications  . hydrochlorothiazide (HYDRODIURIL) 25 MG tablet    Sig: Take 1 tablet (25 mg total) by mouth daily.    Dispense:  90 tablet    Refill:  1  . mirabegron ER (MYRBETRIQ) 50 MG TB24 tablet    Sig: Take 1 tablet (50 mg total) by mouth daily.    Dispense:  90 tablet    Refill:  1  . mirabegron ER (MYRBETRIQ) 50 MG TB24 tablet    Sig: Take 1 tablet (50 mg total) by mouth daily.    Dispense:  30 tablet    Refill:  0  . amitriptyline (ELAVIL) 25 MG tablet    Sig: Take 1 tablet (25 mg total) by mouth at bedtime.     Dispense:  90 tablet    Refill:  1  . metoprolol tartrate (LOPRESSOR) 25 MG tablet    Sig: Take 1 tablet (25 mg total) by mouth 2 (two) times daily.    Dispense:  180 tablet    Refill:  1  . atorvastatin (LIPITOR) 20 MG tablet    Sig: Take 1 tablet (20 mg total) by mouth daily.    Dispense:  90 tablet    Refill:  1  . albuterol (VENTOLIN HFA) 108 (90 Base) MCG/ACT inhaler    Sig: Inhale 2 puffs into the lungs every 4 (four) hours as needed for wheezing.    Dispense:  18 g    Refill:  2  . amLODipine (NORVASC) 10 MG tablet    Sig: Take 1 tablet (10 mg total) by mouth daily.    Dispense:  90 tablet    Refill:  1  . metFORMIN (GLUCOPHAGE) 1000 MG tablet    Sig: Take 1 tablet (1,000 mg total) by mouth 2 (two) times daily with a meal.    Dispense:  180 tablet    Refill:  1  . losartan-hydrochlorothiazide (HYZAAR) 100-25 MG tablet    Sig: Take 1 tablet by mouth daily.    Dispense:  90 tablet    Refill:  3  . cetirizine (ZYRTEC) 10 MG tablet    Sig: Take 1 tablet (10 mg total) by mouth daily.    Dispense:  30 tablet    Refill:  11    Follow-up: Return in about 6 months (around 11/01/2020) for DM follow up in person.    Kerin Perna, NP
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
## 2020-05-01 NOTE — Progress Notes (Signed)
Pt states Detrol is not helping with her overactive bladder she is still waking up 6-7 times a night to use the bathroom. She would like to put back on Myrbetriq.

## 2020-05-02 LAB — CBC WITH DIFFERENTIAL/PLATELET
Basophils Absolute: 0.1 10*3/uL (ref 0.0–0.2)
Basos: 0 %
EOS (ABSOLUTE): 0.2 10*3/uL (ref 0.0–0.4)
Eos: 2 %
Hematocrit: 41.1 % (ref 34.0–46.6)
Hemoglobin: 13.6 g/dL (ref 11.1–15.9)
Immature Grans (Abs): 0 10*3/uL (ref 0.0–0.1)
Immature Granulocytes: 0 %
Lymphocytes Absolute: 4 10*3/uL — ABNORMAL HIGH (ref 0.7–3.1)
Lymphs: 35 %
MCH: 29.1 pg (ref 26.6–33.0)
MCHC: 33.1 g/dL (ref 31.5–35.7)
MCV: 88 fL (ref 79–97)
Monocytes Absolute: 0.6 10*3/uL (ref 0.1–0.9)
Monocytes: 5 %
Neutrophils Absolute: 6.7 10*3/uL (ref 1.4–7.0)
Neutrophils: 58 %
Platelets: 359 10*3/uL (ref 150–450)
RBC: 4.68 x10E6/uL (ref 3.77–5.28)
RDW: 14.1 % (ref 11.7–15.4)
WBC: 11.7 10*3/uL — ABNORMAL HIGH (ref 3.4–10.8)

## 2020-05-02 LAB — CMP14+EGFR
ALT: 15 IU/L (ref 0–32)
AST: 15 IU/L (ref 0–40)
Albumin/Globulin Ratio: 1.9 (ref 1.2–2.2)
Albumin: 4.7 g/dL (ref 3.8–4.8)
Alkaline Phosphatase: 154 IU/L — ABNORMAL HIGH (ref 48–121)
BUN/Creatinine Ratio: 18 (ref 12–28)
BUN: 16 mg/dL (ref 8–27)
Bilirubin Total: 0.2 mg/dL (ref 0.0–1.2)
CO2: 23 mmol/L (ref 20–29)
Calcium: 10.6 mg/dL — ABNORMAL HIGH (ref 8.7–10.3)
Chloride: 100 mmol/L (ref 96–106)
Creatinine, Ser: 0.87 mg/dL (ref 0.57–1.00)
GFR calc Af Amer: 81 mL/min/{1.73_m2} (ref >59)
GFR calc non Af Amer: 70 mL/min/{1.73_m2} (ref >59)
Globulin, Total: 2.5 g/dL (ref 1.5–4.5)
Glucose: 80 mg/dL (ref 65–99)
Potassium: 4.1 mmol/L (ref 3.5–5.2)
Sodium: 140 mmol/L (ref 134–144)
Total Protein: 7.2 g/dL (ref 6.0–8.5)

## 2020-05-02 LAB — LIPID PANEL
Chol/HDL Ratio: 2.5 ratio (ref 0.0–4.4)
Cholesterol, Total: 117 mg/dL (ref 100–199)
HDL: 47 mg/dL (ref >39)
LDL Chol Calc (NIH): 45 mg/dL (ref 0–99)
Triglycerides: 149 mg/dL (ref 0–149)
VLDL Cholesterol Cal: 25 mg/dL (ref 5–40)

## 2020-05-06 ENCOUNTER — Telehealth (INDEPENDENT_AMBULATORY_CARE_PROVIDER_SITE_OTHER): Payer: Self-pay

## 2020-05-06 NOTE — Telephone Encounter (Signed)
Patient returned call to clinic. After verifying birth date she was informed that her cholesterol is normal but continue taking atorvastatin nightly. Slight elevation in liver enzymes. Advised her that if she is taking NSAIDs to stop. Patient does not consume alcohol. She is aware that we will continue to monitor liver enzymes. She verbalized understanding of results. Nat Christen, CMA

## 2020-05-06 NOTE — Telephone Encounter (Signed)
-----   Message from Kerin Perna, NP sent at 05/06/2020  2:01 PM EDT ----- Cholesterol is normal. Continue Atorvastatin 20mg  at bedtime.Slightly elevated liver enzymes if taking NSAID stop or alcohol can cause elevation

## 2020-05-31 ENCOUNTER — Other Ambulatory Visit (INDEPENDENT_AMBULATORY_CARE_PROVIDER_SITE_OTHER): Payer: Self-pay | Admitting: Primary Care

## 2020-05-31 DIAGNOSIS — N3281 Overactive bladder: Secondary | ICD-10-CM

## 2020-05-31 DIAGNOSIS — I10 Essential (primary) hypertension: Secondary | ICD-10-CM

## 2020-06-14 ENCOUNTER — Telehealth (INDEPENDENT_AMBULATORY_CARE_PROVIDER_SITE_OTHER): Payer: Self-pay | Admitting: Primary Care

## 2020-06-14 NOTE — Telephone Encounter (Signed)
Pt called and stated her pharmacy has reach out for approval for a refill for omeprazole (PRILOSEC) 10 MG capsule  But has not herd back from office/ please advise and send to  [place], [place] Phone:  [phone]  Fax:  [phone]

## 2020-06-17 ENCOUNTER — Other Ambulatory Visit (INDEPENDENT_AMBULATORY_CARE_PROVIDER_SITE_OTHER): Payer: Self-pay | Admitting: Primary Care

## 2020-06-20 ENCOUNTER — Ambulatory Visit (HOSPITAL_COMMUNITY)
Admission: EM | Admit: 2020-06-20 | Discharge: 2020-06-20 | Disposition: A | Discharge status: Home / Self Care | Payer: Medicare HMO | Attending: Family Medicine | Admitting: Family Medicine

## 2020-06-20 ENCOUNTER — Ambulatory Visit: Payer: Self-pay

## 2020-06-20 ENCOUNTER — Encounter (HOSPITAL_COMMUNITY): Payer: Self-pay

## 2020-06-20 ENCOUNTER — Other Ambulatory Visit: Payer: Self-pay

## 2020-06-20 ENCOUNTER — Telehealth (HOSPITAL_COMMUNITY): Payer: Self-pay

## 2020-06-20 DIAGNOSIS — K1120 Sialoadenitis, unspecified: Secondary | ICD-10-CM | POA: Diagnosis not present

## 2020-06-20 MED ORDER — IBUPROFEN 600 MG PO TABS
600.0000 mg | ORAL_TABLET | Freq: Three times a day (TID) | ORAL | 0 refills | Status: DC | PRN
Start: 2020-06-20 — End: 2020-06-20

## 2020-06-20 MED ORDER — AMOXICILLIN-POT CLAVULANATE 875-125 MG PO TABS
1.0000 | ORAL_TABLET | Freq: Two times a day (BID) | ORAL | 0 refills | Status: DC
Start: 2020-06-20 — End: 2020-06-20

## 2020-06-20 MED ORDER — CEFTRIAXONE SODIUM 1 G IJ SOLR
INTRAMUSCULAR | Status: AC
  Filled 2020-06-20: qty 10

## 2020-06-20 MED ORDER — CEFTRIAXONE SODIUM 1 G IJ SOLR
1.0000 g | Freq: Once | INTRAMUSCULAR | Status: AC
  Administered 2020-06-20: 1 g via INTRAMUSCULAR

## 2020-06-20 MED ORDER — LIDOCAINE HCL (PF) 1 % IJ SOLN
INTRAMUSCULAR | Status: AC
  Filled 2020-06-20: qty 2

## 2020-06-20 MED ORDER — IBUPROFEN 600 MG PO TABS
600.0000 mg | ORAL_TABLET | Freq: Three times a day (TID) | ORAL | 0 refills | Status: DC | PRN
Start: 2020-06-20 — End: 2021-06-09

## 2020-06-20 MED ORDER — DEXAMETHASONE SODIUM PHOSPHATE 10 MG/ML IJ SOLN: 10.0000 mg | Freq: Once | INTRAMUSCULAR | Status: DC

## 2020-06-20 MED ORDER — AMOXICILLIN-POT CLAVULANATE 875-125 MG PO TABS
1.0000 | ORAL_TABLET | Freq: Two times a day (BID) | ORAL | 0 refills | Status: DC
Start: 2020-06-20 — End: 2020-10-28

## 2020-06-20 NOTE — ED Provider Notes (Addendum)
[place]    CSN: [id number] Arrival date & time: 06/20/20  0845      History   Chief Complaint Chief Complaint  Patient presents with  . Sore Throat    HPI Amber Meyer is a 64 y.o. female.   Patient is a 64 year old female presents today with right neck and facial swelling.  This started a few days ago.  She has had some pain with swelling also.  Reporting lump to the side of her neck.  She states that this has been coming and going for the past month but this is the worse at [place].  Tender to entire area to include ear.  No fevers, chills, body aches, nausea.  States the area feels warm.  No history of similar.  Has not take anything for her symptoms.  ROS per HPI      Past Medical History:  Diagnosis Date  . GERD (gastroesophageal reflux disease)   . Hypertension   . Renal disorder    3 kidney stones  . Sinus bradycardia seen on cardiac monitor   . Urinary frequency     Patient Active Problem List   Diagnosis Date Noted  . Osteoarthritis of right knee 12/23/2017    Past Surgical History:  Procedure Laterality Date  . ABDOMINAL HYSTERECTOMY  1981   partial  . Kidney stone removal Left 1999  . KNEE ARTHROPLASTY Right 12/23/2017   Procedure: RIGHT TOTAL KNEE ARTHROPLASTY WITH COMPUTER NAVIGATION;  Surgeon: Rod Can, MD;  Location: [facility];  Service: Orthopedics;  Laterality: Right;  NEEDS RNFA  . KNEE SURGERY Right 2013   cleaned out damaged tissue and arthritis  . LITHOTRIPSY  2003, 2006   x 2    OB History   No obstetric history on file.      Home Medications    Prior to Admission medications   Medication Sig Start Date End Date Taking? Authorizing Provider  albuterol (VENTOLIN HFA) 108 (90 Base) MCG/ACT inhaler INHALE 2 PUFFS INTO THE LUNGS EVERY 4 HOURS AS NEEDED FOR WHEEZING 06/18/20   Kerin Perna, NP  amitriptyline (ELAVIL) 25 MG tablet TAKE 1 TABLET AT BEDTIME 06/06/20   Kerin Perna, NP  amLODipine  (NORVASC) 10 MG tablet TAKE 1 TABLET EVERY DAY 06/06/20   Kerin Perna, NP  amoxicillin-clavulanate (AUGMENTIN) 875-125 MG tablet Take 1 tablet by mouth every 12 (twelve) hours. 06/20/20   Loura Halt A, NP  atorvastatin (LIPITOR) 20 MG tablet Take 1 tablet (20 mg total) by mouth daily. 05/01/20   Kerin Perna, NP  Blood Glucose Monitoring Suppl (TRUE METRIX METER) w/Device KIT Use as instructed. 09/29/19   Charlott Rakes, MD  cetirizine (ZYRTEC) 10 MG tablet Take 1 tablet (10 mg total) by mouth daily. 05/01/20   Kerin Perna, NP  co-enzyme Q-10 30 MG capsule Take 30 mg by mouth daily.    [provider]  diphenhydrAMINE (BENADRYL ALLERGY) 25 MG tablet Take 1 tablet (25 mg total) by mouth every 6 (six) hours as needed. Patient taking differently: Take 25 mg by mouth every 6 (six) hours as needed for itching.  02/16/18   Clent Demark, PA-C  esomeprazole (NEXIUM) 40 MG capsule Take 1 capsule (40 mg total) by mouth daily at 12 noon. 03/20/20   Charlott Rakes, MD  glucose blood (TRUE METRIX BLOOD GLUCOSE TEST) test strip Use as instructed 09/29/19   Charlott Rakes, MD  hydrochlorothiazide (HYDRODIURIL) 25 MG tablet Take 1 tablet (25 mg  total) by mouth daily. 05/01/20   Kerin Perna, NP  ibuprofen (ADVIL) 600 MG tablet Take 1 tablet (600 mg total) by mouth every 8 (eight) hours as needed for moderate pain. 06/20/20   Warda Mcqueary, Tressia Miners A, NP  losartan-hydrochlorothiazide (HYZAAR) 100-25 MG tablet Take 1 tablet by mouth daily. 05/01/20   Kerin Perna, NP  metFORMIN (GLUCOPHAGE) 1000 MG tablet TAKE 1 TABLET TWICE DAILY WITH MEALS Patient taking differently: 500 mg 2 (two) times daily with a meal.  06/06/20   Kerin Perna, NP  metoprolol tartrate (LOPRESSOR) 25 MG tablet TAKE 1 TABLET TWICE DAILY 06/06/20   Kerin Perna, NP  mirabegron ER (MYRBETRIQ) 50 MG TB24 tablet Take 1 tablet (50 mg total) by mouth daily. 05/01/20   Kerin Perna, NP  mirabegron ER  (MYRBETRIQ) 50 MG TB24 tablet Take 1 tablet (50 mg total) by mouth daily. 05/01/20   Kerin Perna, NP  Multiple Vitamins-Calcium (ONE-A-DAY WOMENS PO) Take 1 tablet by mouth daily.    [provider]  TRUEplus Lancets 28G MISC Use as instructed. 09/29/19   Charlott Rakes, MD    Family History Family History  Problem Relation Age of Onset  . Aneurysm Mother   . Alzheimer's disease Father     Social History Social History   Tobacco Use  . Smoking status: Current Every Day Smoker    Packs/day: 0.50    Types: Cigarettes  . Smokeless tobacco: Never Used  . Tobacco comment: down to 2 cigs a day   Vaping Use  . Vaping Use: Never used  Substance Use Topics  . Alcohol use: No  . Drug use: No     Allergies   Hydrocodone-acetaminophen   Review of Systems Review of Systems   Physical Exam Triage Vital Signs ED Triage Vitals  Enc Vitals Group     BP 06/20/20 0920 135/72     Pulse Rate 06/20/20 0920 (!) 118     Resp 06/20/20 0920 16     Temp 06/20/20 0920 98.6 F (37 C)     Temp Source 06/20/20 0920 Oral     SpO2 06/20/20 0920 99 %     Weight 06/20/20 0921 222 lb (100.7 kg)     Height 06/20/20 0921 _0  (1.702 m)     Head Circumference --      Peak Flow --      Pain Score 06/20/20 0921 8     Pain Loc --      Pain Edu? --      Excl. in [place]? --    No data found.  Updated Vital Signs BP 135/72   Pulse (!) 118   Temp 98.6 F (37 C) (Oral)   Resp 16   Ht _1  (1.702 m)   Wt 222 lb (100.7 kg)   SpO2 99%   BMI 34.77 kg/m   Visual Acuity Right Eye Distance:   Left Eye Distance:   Bilateral Distance:    Right Eye Near:   Left Eye Near:    Bilateral Near:     Physical Exam Vitals and nursing note reviewed.  Constitutional:      General: She is not in acute distress.    Appearance: She is well-developed. She is not ill-appearing, toxic-appearing or diaphoretic.  HENT:     Head:      Comments: Moderate swelling to right neck extending in  the facial area. Hard palpable knot in cervical chain. Mild mastoid tenderness Mild generalized  erythema and warmth    Right Ear: Tympanic membrane and ear canal normal.     Left Ear: Tympanic membrane and ear canal normal.     Mouth/Throat:     Mouth: No oral lesions.     Pharynx: Oropharynx is clear. Uvula midline. No pharyngeal swelling, oropharyngeal exudate, posterior oropharyngeal erythema or uvula swelling.     Tonsils: 0 on the right. 0 on the left.  Lymphadenopathy:     Cervical: Cervical adenopathy present.  Skin:    General: Skin is warm.  Neurological:     Mental Status: She is alert.      UC Treatments / Results  Labs (all labs ordered are listed, but only abnormal results are displayed) Labs Reviewed - No data to display  EKG   Radiology No results found.  Procedures Procedures (including critical care time)  Medications Ordered in UC Medications  cefTRIAXone (ROCEPHIN) injection 1 g (1 g Intramuscular Given 06/20/20 1029)    Initial Impression / Assessment and Plan / UC Course  I have reviewed the triage vital signs and the nursing notes.  Pertinent labs & imaging results that were available during my care of the patient were reviewed by me and considered in my medical decision making (see chart for details).     Sialoadenitis Most likely diagnosis based on physical exam We will treat aggressively here with Rocephin injection and sent home with Augmentin to start taking today. 600 of ibuprofen every 8 hours for pain, inflammation and swelling. Information given on this and how to treat Strict, strict return and ER precautions given Final Clinical Impressions(s) / UC Diagnoses   Final diagnoses:  Sialoadenitis     Discharge Instructions     I believe this is sialoadenitis or acute salivary gland infection. Given you an antibiotic injection here today.  Also sending some more antibiotics to the pharmacy for you to start taking today. 600 of  ibuprofen every 8 hours If your symptoms worsen over the next 24 to 48 hours please go to the ER.    ED Prescriptions    Medication Sig Dispense Auth. Provider   amoxicillin-clavulanate (AUGMENTIN) 875-125 MG tablet Take 1 tablet by mouth every 12 (twelve) hours. 14 tablet Sarahy Creedon A, NP   ibuprofen (ADVIL) 600 MG tablet Take 1 tablet (600 mg total) by mouth every 8 (eight) hours as needed for moderate pain. 30 tablet Loura Halt A, NP     PDMP not reviewed this encounter.   Orvan July, NP 06/20/20 1049    Loura Halt A, NP 06/21/20 1303

## 2020-06-20 NOTE — Telephone Encounter (Signed)
Pt. Reports she woke up with a lump on the right side of her throat. Golf ball size. Hurts to swallow. No availability with PCP per [facility] agent, Norma. Pt. Will go to UC.  Answer Assessment - Initial Assessment Questions 1. LOCATION: "Where is the swollen node located?" "Is the matching node on the other side of the body also swollen?"      Right side of neck 2. SIZE: "How big is the node?" (Inches or centimeters) (or compare to common objects such as pea, bean, marble, golf ball)      Golf ball size 3. ONSET: "When did the swelling start?"      Woke up with it 4. NECK NODES: "Is there a sore throat, runny nose or other symptoms of a cold?"      Hurts to swallow. 5. GROIN OR ARMPIT NODES: "Is there a sore, scratch, cut or painful red area on that arm or leg?"      No 6. FEVER: "Do you have a fever?" If Yes, ask: "What is it, how was it measured, and when did it start?"      Maybe 7. CAUSE: "What do you think is causing the swollen lymph nodes?"     Unsure 8. OTHER SYMPTOMS: "Do you have any other symptoms?"     No 9. PREGNANCY: "Is there any chance you are pregnant?" "When was your last menstrual period?"     No  Protocols used: LYMPH NODES - [facility]

## 2020-06-20 NOTE — ED Triage Notes (Signed)
Pt c/o sore throat and dysphagia since yesterday. Pt denies any other symptoms. Pt c/o lump on right side of neck that comes and goesx49mo.

## 2020-06-20 NOTE — Discharge Instructions (Addendum)
I believe this is sialoadenitis or acute salivary gland infection. Given you an antibiotic injection here today.  Also sending some more antibiotics to the pharmacy for you to start taking today. 600 of ibuprofen every 8 hours If your symptoms worsen over the next 24 to 48 hours please go to the ER.

## 2020-06-21 NOTE — Telephone Encounter (Signed)
Pharmacy has not sent request for omeprazole. Omeprazole is not on patients current medication list.

## 2020-06-27 ENCOUNTER — Ambulatory Visit (INDEPENDENT_AMBULATORY_CARE_PROVIDER_SITE_OTHER): Payer: Medicare HMO | Admitting: Primary Care

## 2020-06-28 ENCOUNTER — Other Ambulatory Visit (INDEPENDENT_AMBULATORY_CARE_PROVIDER_SITE_OTHER): Payer: Self-pay | Admitting: Primary Care

## 2020-08-26 ENCOUNTER — Other Ambulatory Visit: Payer: Self-pay | Admitting: Family Medicine

## 2020-08-26 NOTE — Telephone Encounter (Signed)
Requested Prescriptions  Pending Prescriptions Disp Refills   esomeprazole (NEXIUM) 40 MG capsule [Pharmacy Med Name: ESOMEPRAZOLE MAGNESIUM 40MG  DR CAPS] 90 capsule 1    Sig: TAKE ONE CAPSULE BY MOUTH AT [place]     Gastroenterology: Proton Pump Inhibitors Passed - 08/26/2020 10:47 AM      Passed - Valid encounter within last 12 months    Recent Outpatient Visits          3 months ago Overactive bladder   [place] Kerin Perna, NP   8 months ago Type 2 diabetes mellitus without complication, without long-term current use of insulin ([place])   [place], Michelle P, NP   10 months ago Hyperglycemia   [place], Jarome Matin, RPH-CPP   11 months ago Hyperglycemia   [place], Jarome Matin, RPH-CPP   11 months ago Screening for diabetes mellitus   [place], Michelle P, NP      Future Appointments            In 2 months Oletta Lamas, [place] Cage, NP [place]

## 2020-10-07 ENCOUNTER — Other Ambulatory Visit (INDEPENDENT_AMBULATORY_CARE_PROVIDER_SITE_OTHER): Payer: Self-pay | Admitting: Primary Care

## 2020-10-22 ENCOUNTER — Other Ambulatory Visit (INDEPENDENT_AMBULATORY_CARE_PROVIDER_SITE_OTHER): Payer: Self-pay | Admitting: Primary Care

## 2020-10-22 MED ORDER — ESOMEPRAZOLE MAGNESIUM 40 MG PO CPDR: DELAYED_RELEASE_CAPSULE | ORAL | 1 refills | Status: DC

## 2020-10-28 ENCOUNTER — Ambulatory Visit (INDEPENDENT_AMBULATORY_CARE_PROVIDER_SITE_OTHER): Payer: Medicare HMO | Admitting: Primary Care

## 2020-10-28 ENCOUNTER — Encounter (INDEPENDENT_AMBULATORY_CARE_PROVIDER_SITE_OTHER): Payer: Self-pay | Admitting: Primary Care

## 2020-10-28 ENCOUNTER — Other Ambulatory Visit: Payer: Self-pay

## 2020-10-28 VITALS — BP 127/80 | HR 94 | Temp 97.2°F | Ht 67.0 in | Wt 233.8 lb

## 2020-10-28 DIAGNOSIS — E119 Type 2 diabetes mellitus without complications: Secondary | ICD-10-CM | POA: Diagnosis not present

## 2020-10-28 DIAGNOSIS — Z760 Encounter for issue of repeat prescription: Secondary | ICD-10-CM

## 2020-10-28 DIAGNOSIS — Z1231 Encounter for screening mammogram for malignant neoplasm of breast: Secondary | ICD-10-CM | POA: Diagnosis not present

## 2020-10-28 DIAGNOSIS — Z23 Encounter for immunization: Secondary | ICD-10-CM

## 2020-10-28 DIAGNOSIS — Z780 Asymptomatic menopausal state: Secondary | ICD-10-CM | POA: Diagnosis not present

## 2020-10-28 DIAGNOSIS — N3281 Overactive bladder: Secondary | ICD-10-CM | POA: Diagnosis not present

## 2020-10-28 DIAGNOSIS — Z1211 Encounter for screening for malignant neoplasm of colon: Secondary | ICD-10-CM | POA: Diagnosis not present

## 2020-10-28 DIAGNOSIS — G4733 Obstructive sleep apnea (adult) (pediatric): Secondary | ICD-10-CM | POA: Diagnosis not present

## 2020-10-28 DIAGNOSIS — I10 Essential (primary) hypertension: Secondary | ICD-10-CM

## 2020-10-28 LAB — POCT GLYCOSYLATED HEMOGLOBIN (HGB A1C): Hemoglobin A1C: 6.7 % — AB (ref 4.0–5.6)

## 2020-10-28 MED ORDER — AMLODIPINE BESYLATE 10 MG PO TABS: 10.0000 mg | ORAL_TABLET | Freq: Every day | ORAL | 1 refills | Status: DC

## 2020-10-28 MED ORDER — AMITRIPTYLINE HCL 50 MG PO TABS: 50.0000 mg | ORAL_TABLET | Freq: Every day | ORAL | 1 refills | Status: DC

## 2020-10-28 MED ORDER — LOSARTAN POTASSIUM-HCTZ 100-25 MG PO TABS: 1.0000 | ORAL_TABLET | Freq: Every day | ORAL | 3 refills | Status: DC

## 2020-10-28 MED ORDER — ALBUTEROL SULFATE HFA 108 (90 BASE) MCG/ACT IN AERS: INHALATION_SPRAY | RESPIRATORY_TRACT | 2 refills | Status: DC

## 2020-10-28 MED ORDER — ATORVASTATIN CALCIUM 20 MG PO TABS: ORAL_TABLET | ORAL | 1 refills | Status: DC

## 2020-10-28 MED ORDER — METFORMIN HCL 500 MG PO TABS: 500.0000 mg | ORAL_TABLET | Freq: Two times a day (BID) | ORAL | 3 refills | Status: DC

## 2020-10-28 MED ORDER — MIRABEGRON ER 50 MG PO TB24: 50.0000 mg | ORAL_TABLET | Freq: Every day | ORAL | 1 refills | Status: DC

## 2020-10-28 MED ORDER — METOPROLOL TARTRATE 25 MG PO TABS: 25.0000 mg | ORAL_TABLET | Freq: Two times a day (BID) | ORAL | 1 refills | Status: DC

## 2020-10-28 NOTE — Progress Notes (Signed)
Established Patient Office Visit  Subjective:  Patient ID: Amber Meyer, female    DOB: [birth year]  Age: 64 y.o. MRN: [id number]  CC:  Chief Complaint  Patient presents with  . Diabetes    HPI [name] is a 64 year old obese who presents for management of type 2 diabetes she admits to polydipsia and no polyuria or polyphagia. Bp unremarkable . Denies shortness of breath, headaches, chest pain or lower extremity edema  Past Medical History:  Diagnosis Date  . GERD (gastroesophageal reflux disease)   . Hypertension   . Renal disorder    3 kidney stones  . Sinus bradycardia seen on cardiac monitor   . Urinary frequency     Past Surgical History:  Procedure Laterality Date  . ABDOMINAL HYSTERECTOMY  1981   partial  . Kidney stone removal Left 1999  . KNEE ARTHROPLASTY Right 12/23/2017   Procedure: RIGHT TOTAL KNEE ARTHROPLASTY WITH COMPUTER NAVIGATION;  Surgeon: Rod Can, MD;  Location: [facility];  Service: Orthopedics;  Laterality: Right;  NEEDS RNFA  . KNEE SURGERY Right 2013   cleaned out damaged tissue and arthritis  . LITHOTRIPSY  2003, 2006   x 2    Family History  Problem Relation Age of Onset  . Aneurysm Mother   . Alzheimer's disease Father     Social History   Socioeconomic History  . Marital status: Single    Spouse name: Not on file  . Number of children: Not on file  . Years of education: Not on file  . Highest education level: Not on file  Occupational History  . Not on file  Tobacco Use  . Smoking status: Current Every Day Smoker    Packs/day: 0.50    Types: Cigarettes  . Smokeless tobacco: Never Used  . Tobacco comment: down to 2 cigs a day   Vaping Use  . Vaping Use: Never used  Substance and Sexual Activity  . Alcohol use: No  . Drug use: No  . Sexual activity: Not Currently  Other Topics Concern  . Not on file  Social History Narrative  . Not on file   Social Determinants of Health   Financial Resource  Strain:   . Difficulty of Paying Living Expenses: Not on file  Food Insecurity:   . Worried About Charity fundraiser in the Last Year: Not on file  . Ran Out of Food in the Last Year: Not on file  Transportation Needs:   . Lack of Transportation (Medical): Not on file  . Lack of Transportation (Non-Medical): Not on file  Physical Activity:   . Days of Exercise per Week: Not on file  . Minutes of Exercise per Session: Not on file  Stress:   . Feeling of Stress : Not on file  Social Connections:   . Frequency of Communication with Friends and Family: Not on file  . Frequency of Social Gatherings with Friends and Family: Not on file  . Attends Religious Services: Not on file  . Active Member of Clubs or Organizations: Not on file  . Attends Archivist Meetings: Not on file  . Marital Status: Not on file  Intimate Partner Violence:   . Fear of Current or Ex-Partner: Not on file  . Emotionally Abused: Not on file  . Physically Abused: Not on file  . Sexually Abused: Not on file    Outpatient Medications Prior to Visit  Medication Sig Dispense Refill  . Blood  Glucose Monitoring Suppl (TRUE METRIX METER) w/Device KIT Use as instructed. 1 kit 0  . cetirizine (ZYRTEC) 10 MG tablet Take 1 tablet (10 mg total) by mouth daily. 30 tablet 11  . co-enzyme Q-10 30 MG capsule Take 30 mg by mouth daily.    . diphenhydrAMINE (BENADRYL ALLERGY) 25 MG tablet Take 1 tablet (25 mg total) by mouth every 6 (six) hours as needed. (Patient taking differently: Take 25 mg by mouth every 6 (six) hours as needed for itching. ) 30 tablet 0  . esomeprazole (NEXIUM) 40 MG capsule TAKE ONE CAPSULE BY MOUTH AT NOON 90 capsule 1  . glucose blood (TRUE METRIX BLOOD GLUCOSE TEST) test strip Use as instructed 100 each 12  . ibuprofen (ADVIL) 600 MG tablet Take 1 tablet (600 mg total) by mouth every 8 (eight) hours as needed for moderate pain. 30 tablet 0  . Multiple Vitamins-Calcium (ONE-A-DAY WOMENS PO) Take  1 tablet by mouth daily.    . TRUEplus Lancets 28G MISC Use as instructed. 100 each 11  . albuterol (VENTOLIN HFA) 108 (90 Base) MCG/ACT inhaler INHALE 2 PUFFS INTO THE LUNGS EVERY 4 HOURS AS NEEDED FOR WHEEZING 18 g 2  . amitriptyline (ELAVIL) 25 MG tablet TAKE 1 TABLET AT BEDTIME 90 tablet 1  . amLODipine (NORVASC) 10 MG tablet TAKE 1 TABLET EVERY DAY 90 tablet 1  . atorvastatin (LIPITOR) 20 MG tablet TAKE 1 TABLET(20 MG) BY MOUTH DAILY 90 tablet 1  . hydrochlorothiazide (HYDRODIURIL) 25 MG tablet Take 1 tablet (25 mg total) by mouth daily. 90 tablet 1  . losartan-hydrochlorothiazide (HYZAAR) 100-25 MG tablet Take 1 tablet by mouth daily. 90 tablet 3  . metFORMIN (GLUCOPHAGE) 1000 MG tablet TAKE 1 TABLET TWICE DAILY WITH MEALS (Patient taking differently: 500 mg 2 (two) times daily with a meal. ) 180 tablet 1  . metoprolol tartrate (LOPRESSOR) 25 MG tablet TAKE 1 TABLET TWICE DAILY 180 tablet 1  . mirabegron ER (MYRBETRIQ) 50 MG TB24 tablet Take 1 tablet (50 mg total) by mouth daily. 90 tablet 1  . mirabegron ER (MYRBETRIQ) 50 MG TB24 tablet Take 1 tablet (50 mg total) by mouth daily. 30 tablet 0  . MYRBETRIQ 50 MG TB24 tablet TAKE 1 TABLET EVERY DAY 90 tablet 0  . amoxicillin-clavulanate (AUGMENTIN) 875-125 MG tablet Take 1 tablet by mouth every 12 (twelve) hours. 14 tablet 0   No facility-administered medications prior to visit.    Allergies  Allergen Reactions  . Hydrocodone-Acetaminophen Hives and Other (See Comments)    Can take with Benadryl     ROS Review of Systems  Endocrine: Positive for polydipsia.  All other systems reviewed and are negative.     Objective:    Physical Exam Vitals reviewed.  Constitutional:      Appearance: Normal appearance.  HENT:     Head: Normocephalic.     Right Ear: Tympanic membrane normal.     Left Ear: Tympanic membrane normal.     Nose: Nose normal.  Eyes:     Extraocular Movements: Extraocular movements intact.  Cardiovascular:      Rate and Rhythm: Normal rate and regular rhythm.  Pulmonary:     Effort: Pulmonary effort is normal.     Breath sounds: Normal breath sounds.  Abdominal:     General: Bowel sounds are normal.     Palpations: Abdomen is soft.  Musculoskeletal:        General: Normal range of motion.  Skin:    General:  Skin is warm and dry.  Neurological:     Mental Status: She is alert and oriented to person, place, and time.  Psychiatric:        Mood and Affect: Mood normal.        Behavior: Behavior normal.        Thought Content: Thought content normal.        Judgment: Judgment normal.    BP 127/80 (BP Location: Right Arm, Patient Position: Sitting, Cuff Size: Large)   Pulse 94   Temp (!) 97.2 F (36.2 C) (Temporal)   Ht 5' 7"  (1.702 m)   Wt 233 lb 12.8 oz (106.1 kg)   SpO2 97%   BMI 36.62 kg/m  Wt Readings from Last 3 Encounters:  10/28/20 233 lb 12.8 oz (106.1 kg)  06/20/20 222 lb (100.7 kg)  05/01/20 230 lb 6.4 oz (104.5 kg)     Health Maintenance Due  Topic Date Due  . MAMMOGRAM  Never done  . COLONOSCOPY  Never done  . DEXA SCAN  Never done    There are no preventive care reminders to display for this patient.  Lab Results  Component Value Date   TSH 0.597 02/16/2018   Lab Results  Component Value Date   WBC 11.7 (H) 05/01/2020   HGB 13.6 05/01/2020   HCT 41.1 05/01/2020   MCV 88 05/01/2020   PLT 359 05/01/2020   Lab Results  Component Value Date   NA 140 05/01/2020   K 4.1 05/01/2020   CO2 23 05/01/2020   GLUCOSE 80 05/01/2020   BUN 16 05/01/2020   CREATININE 0.87 05/01/2020   BILITOT 0.2 05/01/2020   ALKPHOS 154 (H) 05/01/2020   AST 15 05/01/2020   ALT 15 05/01/2020   PROT 7.2 05/01/2020   ALBUMIN 4.7 05/01/2020   CALCIUM 10.6 (H) 05/01/2020   ANIONGAP 14 09/22/2019   Lab Results  Component Value Date   CHOL 117 05/01/2020   Lab Results  Component Value Date   HDL 47 05/01/2020   Lab Results  Component Value Date   LDLCALC 45 05/01/2020    Lab Results  Component Value Date   TRIG 149 05/01/2020   Lab Results  Component Value Date   CHOLHDL 2.5 05/01/2020   Lab Results  Component Value Date   HGBA1C 6.7 (A) 10/28/2020      Assessment & Plan:  Amber Meyer was seen today for diabetes.  Diagnoses and all orders for this visit: Amber Meyer was seen today for diabetes.  Diagnoses and all orders for this visit:  Type 2 diabetes mellitus without complication, without long-term current use of insulin (HCC) Well controlled on metformin 500 mg twice daily A: Goal of therapy: Less than 6.5 hemoglobin A1c.  Continue to foods that are high in carbohydrates are the following rice, potatoes, breads, sugars, and pastas.  Reduction in the intake (eating) will assist in lowering your blood sugars. -     HgB A1c -     Lipid Panel  Need for immunization against influenza  CDC recommends influenza vaccine yearly.   -     Flu Vaccine QUAD 36+ mos IM  Encounter for screening mammogram for malignant neoplasm of breast  Preventative care  and care gap Refer for mammogram   Menopause  Preventative care  and care gap -     DG Bone Density; Future -     CBC with Differential  Need for prophylactic vaccination with Streptococcus pneumoniae (Pneumococcus) and Influenza vaccines  Preventative  care  and care gap  Overactive bladder -     amitriptyline (ELAVIL) 50 MG tablet; Take 1 tablet (50 mg total) by mouth at bedtime.  Hypertension, unspecified type Counseled on blood pressure goal of less than 130/80, low-sodium, DASH diet, medication compliance, 150 minutes of moderate intensity exercise per week. Discussed medication compliance, adverse effects. -     amLODipine (NORVASC) 10 MG tablet; Take 1 tablet (10 mg total) by mouth daily. -     metoprolol tartrate (LOPRESSOR) 25 MG tablet; Take 1 tablet (25 mg total) by mouth 2 (two) times daily. -     CMP14+EGFR  Colon cancer screening -     Ambulatory referral to  Gastroenterology  OSA (obstructive sleep apnea) Patient presents with possible obstructive sleep apnea. Patent has a 5 months history of symptoms of nasal obstruction and hypertension. Patient generally gets 4 or 5 hours of sleep per night, and states they generally have poor sleep quality . Snoring of severe severity is present. Apneic episodes is present. Nasal obstruction is present.  Patient has not had tonsillectomy.  -     Nocturnal polysomnography (NPSG); Future  Medication refill -     metFORMIN (GLUCOPHAGE) 500 MG tablet; Take 1 tablet (500 mg total) by mouth 2 (two) times daily with a meal. -     losartan-hydrochlorothiazide (HYZAAR) 100-25 MG tablet; Take 1 tablet by mouth daily. -     amLODipine (NORVASC) 10 MG tablet; Take 1 tablet (10 mg total) by mouth daily. -     atorvastatin (LIPITOR) 20 MG tablet; TAKE 1 TABLET(20 MG) BY MOUTH DAILY -     metoprolol tartrate (LOPRESSOR) 25 MG tablet; Take 1 tablet (25 mg total) by mouth 2 (two) times daily. -     mirabegron ER (MYRBETRIQ) 50 MG TB24 tablet; Take 1 tablet (50 mg total) by mouth daily. -     amitriptyline (ELAVIL) 50 MG tablet; Take 1 tablet (50 mg total) by mouth at bedtime.  Other orders -     albuterol (VENTOLIN HFA) 108 (90 Base) MCG/ACT inhaler; INHALE 2 PUFFS INTO THE LUNGS EVERY 4 HOURS AS NEEDED FOR WHEEZING   Follow-up: Return in about 6 months (around 04/27/2021) for Dm, HTN, overactive bladder in person.    Kerin Perna, NP

## 2020-10-28 NOTE — Patient Instructions (Signed)

## 2020-10-29 LAB — CMP14+EGFR
ALT: 13 IU/L (ref 0–32)
AST: 11 IU/L (ref 0–40)
Albumin/Globulin Ratio: 1.6 (ref 1.2–2.2)
Albumin: 4.5 g/dL (ref 3.8–4.8)
Alkaline Phosphatase: 173 IU/L — ABNORMAL HIGH (ref 44–121)
BUN/Creatinine Ratio: 19 (ref 12–28)
BUN: 19 mg/dL (ref 8–27)
Bilirubin Total: 0.3 mg/dL (ref 0.0–1.2)
CO2: 26 mmol/L (ref 20–29)
Calcium: 10.5 mg/dL — ABNORMAL HIGH (ref 8.7–10.3)
Chloride: 102 mmol/L (ref 96–106)
Creatinine, Ser: 1.01 mg/dL — ABNORMAL HIGH (ref 0.57–1.00)
GFR calc Af Amer: 68 mL/min/{1.73_m2} (ref >59)
GFR calc non Af Amer: 59 mL/min/{1.73_m2} — ABNORMAL LOW (ref >59)
Globulin, Total: 2.8 g/dL (ref 1.5–4.5)
Glucose: 98 mg/dL (ref 65–99)
Potassium: 3.7 mmol/L (ref 3.5–5.2)
Sodium: 142 mmol/L (ref 134–144)
Total Protein: 7.3 g/dL (ref 6.0–8.5)

## 2020-10-29 LAB — CBC WITH DIFFERENTIAL/PLATELET
Basophils Absolute: 0.1 10*3/uL (ref 0.0–0.2)
Basos: 1 %
EOS (ABSOLUTE): 0.3 10*3/uL (ref 0.0–0.4)
Eos: 3 %
Hematocrit: 40.1 % (ref 34.0–46.6)
Hemoglobin: 13.6 g/dL (ref 11.1–15.9)
Immature Grans (Abs): 0 10*3/uL (ref 0.0–0.1)
Immature Granulocytes: 0 %
Lymphocytes Absolute: 3.8 10*3/uL — ABNORMAL HIGH (ref 0.7–3.1)
Lymphs: 34 %
MCH: 29.3 pg (ref 26.6–33.0)
MCHC: 33.9 g/dL (ref 31.5–35.7)
MCV: 86 fL (ref 79–97)
Monocytes Absolute: 0.6 10*3/uL (ref 0.1–0.9)
Monocytes: 6 %
Neutrophils Absolute: 6.1 10*3/uL (ref 1.4–7.0)
Neutrophils: 56 %
Platelets: 396 10*3/uL (ref 150–450)
RBC: 4.64 x10E6/uL (ref 3.77–5.28)
RDW: 14.5 % (ref 11.7–15.4)
WBC: 11 10*3/uL — ABNORMAL HIGH (ref 3.4–10.8)

## 2020-10-29 LAB — LIPID PANEL
Chol/HDL Ratio: 2.8 ratio (ref 0.0–4.4)
Cholesterol, Total: 134 mg/dL (ref 100–199)
HDL: 48 mg/dL (ref >39)
LDL Chol Calc (NIH): 62 mg/dL (ref 0–99)
Triglycerides: 138 mg/dL (ref 0–149)
VLDL Cholesterol Cal: 24 mg/dL (ref 5–40)

## 2020-12-20 ENCOUNTER — Other Ambulatory Visit (INDEPENDENT_AMBULATORY_CARE_PROVIDER_SITE_OTHER): Payer: Self-pay | Admitting: Primary Care

## 2020-12-20 NOTE — Telephone Encounter (Signed)
Sent to PCP ?

## 2020-12-24 ENCOUNTER — Other Ambulatory Visit: Payer: Self-pay | Admitting: Family Medicine

## 2020-12-24 ENCOUNTER — Other Ambulatory Visit: Payer: Self-pay

## 2020-12-24 DIAGNOSIS — Z20822 Contact with and (suspected) exposure to covid-19: Secondary | ICD-10-CM

## 2020-12-27 ENCOUNTER — Other Ambulatory Visit (INDEPENDENT_AMBULATORY_CARE_PROVIDER_SITE_OTHER): Payer: Self-pay | Admitting: Primary Care

## 2020-12-27 DIAGNOSIS — N3281 Overactive bladder: Secondary | ICD-10-CM

## 2020-12-28 LAB — NOVEL CORONAVIRUS, NAA: SARS-CoV-2, NAA: NOT DETECTED

## 2021-01-01 ENCOUNTER — Ambulatory Visit (AMBULATORY_SURGERY_CENTER): Payer: Medicare HMO | Admitting: *Deleted

## 2021-01-01 ENCOUNTER — Telehealth: Payer: Self-pay | Admitting: *Deleted

## 2021-01-01 ENCOUNTER — Other Ambulatory Visit: Payer: Self-pay

## 2021-01-01 VITALS — Ht 67.0 in | Wt 223.0 lb

## 2021-01-01 DIAGNOSIS — Z1211 Encounter for screening for malignant neoplasm of colon: Secondary | ICD-10-CM

## 2021-01-01 NOTE — Progress Notes (Addendum)
No egg or soy allergy known to patient  No issues with past sedation with any surgeries or procedures No intubation problems in the past  No FH of Malignant Hyperthermia No diet pills per patient No home 02 use per patient  No blood thinners per patient  Pt denies issues with constipation  No A fib or A flutter  EMMI video to pt or via [place] 19 guidelines implemented in [place] today with Pt and RN  Pt is fully vaccinated  for Covid     Pre visit completed virtually. Instructions for prep sent through mail.  Due to the COVID-19 pandemic we are asking patients to follow certain guidelines.  Pt aware of COVID protocols and [facility] guidelines

## 2021-01-01 NOTE — Telephone Encounter (Signed)
Virtual pre visit completed.

## 2021-01-02 ENCOUNTER — Other Ambulatory Visit (INDEPENDENT_AMBULATORY_CARE_PROVIDER_SITE_OTHER): Payer: Self-pay | Admitting: Primary Care

## 2021-01-02 DIAGNOSIS — I10 Essential (primary) hypertension: Secondary | ICD-10-CM

## 2021-01-02 DIAGNOSIS — Z760 Encounter for issue of repeat prescription: Secondary | ICD-10-CM

## 2021-01-15 ENCOUNTER — Other Ambulatory Visit: Payer: Self-pay

## 2021-01-15 ENCOUNTER — Ambulatory Visit (AMBULATORY_SURGERY_CENTER): Payer: Medicare HMO | Admitting: Gastroenterology

## 2021-01-15 ENCOUNTER — Encounter: Payer: Self-pay | Admitting: Gastroenterology

## 2021-01-15 VITALS — BP 146/85 | HR 76 | Temp 97.6°F | Resp 20 | Ht 67.0 in | Wt 223.0 lb

## 2021-01-15 DIAGNOSIS — Z1211 Encounter for screening for malignant neoplasm of colon: Secondary | ICD-10-CM | POA: Diagnosis not present

## 2021-01-15 DIAGNOSIS — K635 Polyp of colon: Secondary | ICD-10-CM | POA: Diagnosis not present

## 2021-01-15 DIAGNOSIS — Z720 Tobacco use: Secondary | ICD-10-CM | POA: Diagnosis not present

## 2021-01-15 DIAGNOSIS — K641 Second degree hemorrhoids: Secondary | ICD-10-CM

## 2021-01-15 DIAGNOSIS — K219 Gastro-esophageal reflux disease without esophagitis: Secondary | ICD-10-CM | POA: Diagnosis not present

## 2021-01-15 DIAGNOSIS — D125 Benign neoplasm of sigmoid colon: Secondary | ICD-10-CM

## 2021-01-15 DIAGNOSIS — I10 Essential (primary) hypertension: Secondary | ICD-10-CM | POA: Diagnosis not present

## 2021-01-15 DIAGNOSIS — K5730 Diverticulosis of large intestine without perforation or abscess without bleeding: Secondary | ICD-10-CM

## 2021-01-15 DIAGNOSIS — E119 Type 2 diabetes mellitus without complications: Secondary | ICD-10-CM | POA: Diagnosis not present

## 2021-01-15 MED ORDER — SODIUM CHLORIDE 0.9 % IV SOLN: 500.0000 mL | Freq: Once | INTRAVENOUS | Status: DC

## 2021-01-15 NOTE — Patient Instructions (Signed)
Please read over handouts about polyps, diverticulosis, hemorrhoids and hemorrhoidal banding  Await pathology- next colonoscopy in 1 year with 2 day prep  Continue your normal medications  Use fiber over the counter- Citrucel, Fibercon, Konsyl or Metamucil    YOU HAD AN ENDOSCOPIC PROCEDURE TODAY AT [facility]:   Refer to the procedure report that was given to you for any specific questions about what was found during the examination.  If the procedure report does not answer your questions, please call your gastroenterologist to clarify.  If you requested that your care partner not be given the details of your procedure findings, then the procedure report has been included in a sealed envelope for you to review at your convenience later.  YOU SHOULD EXPECT: Some feelings of bloating in the abdomen. Passage of more gas than usual.  Walking can help get rid of the air that was put into your GI tract during the procedure and reduce the bloating. If you had a lower endoscopy (such as a colonoscopy or flexible sigmoidoscopy) you may notice spotting of blood in your stool or on the toilet paper. If you underwent a bowel prep for your procedure, you may not have a normal bowel movement for a few days.  Please Note:  You might notice some irritation and congestion in your nose or some drainage.  This is from the oxygen used during your procedure.  There is no need for concern and it should clear up in a day or so.  SYMPTOMS TO REPORT IMMEDIATELY:   Following lower endoscopy (colonoscopy or flexible sigmoidoscopy):  Excessive amounts of blood in the stool  Significant tenderness or worsening of abdominal pains  Swelling of the abdomen that is new, acute  Fever of 100F or higher  For urgent or emergent issues, a gastroenterologist can be reached at any hour by calling [phone]. Do not use MyChart messaging for urgent concerns.    DIET:  We do recommend a small meal at  first, but then you may proceed to your regular diet.  Drink plenty of fluids but you should avoid alcoholic beverages for 24 hours.  ACTIVITY:  You should plan to take it easy for the rest of today and you should NOT DRIVE or use heavy machinery until tomorrow (because of the sedation medicines used during the test).    FOLLOW UP: Our staff will call the number listed on your records 48-72 hours following your procedure to check on you and address any questions or concerns that you may have regarding the information given to you following your procedure. If we do not reach you, we will leave a message.  We will attempt to reach you two times.  During this call, we will ask if you have developed any symptoms of COVID 19. If you develop any symptoms (ie: fever, flu-like symptoms, shortness of breath, cough etc.) before then, please call [phone].  If you test positive for Covid 19 in the 2 weeks post procedure, please call and report this information to [place].    If any biopsies were taken you will be contacted by phone or by letter within the next 1-3 weeks.  Please call us at [phone] if you have not heard about the biopsies in 3 weeks.    SIGNATURES/CONFIDENTIALITY: You and/or your care partner have signed paperwork which will be entered into your electronic medical record.  These signatures attest to the fact that that the information above on your After Visit  Summary has been reviewed and is understood.  Full responsibility of the confidentiality of this discharge information lies with you and/or your care-partner.

## 2021-01-15 NOTE — Progress Notes (Signed)
Called to room to assist during endoscopic procedure.  Patient ID and intended procedure confirmed with present staff. Received instructions for my participation in the procedure from the performing physician.  

## 2021-01-15 NOTE — Progress Notes (Signed)
Report to PACU, RN, vss, BBS= Clear.  

## 2021-01-15 NOTE — Op Note (Signed)
[facility] Patient Name: Amber Meyer Procedure Date: 01/15/2021 11:08 AM MRN: [id number] Endoscopist: Gerrit Heck , MD Age: 65 Referring MD:  Date of Birth: [birth year] Gender: Female Account #: [id number] Procedure:                Colonoscopy Indications:              Screening for colorectal malignant neoplasm (last                            colonoscopy was more than 10 years ago) Medicines:                Monitored Anesthesia Care Procedure:                Pre-Anesthesia Assessment:                           - Prior to the procedure, a History and Physical                            was performed, and patient medications and                            allergies were reviewed. The patient's tolerance of                            previous anesthesia was also reviewed. The risks                            and benefits of the procedure and the sedation                            options and risks were discussed with the patient.                            All questions were answered, and informed consent                            was obtained. Prior Anticoagulants: The patient has                            taken no previous anticoagulant or antiplatelet                            agents. ASA Grade Assessment: II - A patient with                            mild systemic disease. After reviewing the risks                            and benefits, the patient was deemed in                            satisfactory condition to undergo the procedure.  After obtaining informed consent, the colonoscope                            was passed under direct vision. Throughout the                            procedure, the patient's blood pressure, pulse, and                            oxygen saturations were monitored continuously. The                            Olympus CF-HQ190 [id number]) Colonoscope was                            introduced through  the anus and advanced to the the                            cecum, identified by appendiceal orifice and                            ileocecal valve. The colonoscopy was technically                            difficult and complex due to poor bowel prep. The                            patient tolerated the procedure well. The quality                            of the bowel preparation was fair. The ileocecal                            valve, appendiceal orifice, and rectum were                            photographed. Scope In: 11:13:42 AM Scope Out: 11:30:38 AM Scope Withdrawal Time: 0 hours 9 minutes 18 seconds  Total Procedure Duration: 0 hours 16 minutes 56 seconds  Findings:                 The perianal and digital rectal examinations were                            normal.                           A 5 mm polyp was found in the sigmoid colon. The                            polyp was sessile. The polyp was removed with a                            cold snare. Resection and retrieval were complete.  Estimated blood loss was minimal.                           Multiple small and large-mouthed diverticula were                            found in the entire colon.                           A moderate amount of semi-solid stool and solid                            food particulate was found throughout the entire                            colon, interfering with visualization. Lavage of                            the area was performed using copious amounts of tap                            water, resulting in incomplete clearance with fair                            visualization. Cannot rule out the presence of                            small or flat polyps in these areas.                           Non-bleeding internal hemorrhoids were found during                            retroflexion. The hemorrhoids were small. Complications:            No immediate  complications. Estimated Blood Loss:     Estimated blood loss was minimal. Impression:               - Preparation of the colon was fair.                           - One 5 mm polyp in the sigmoid colon, removed with                            a cold snare. Resected and retrieved.                           - Diverticulosis in the entire examined colon.                           - Stool in the entire examined colon.                           - Non-bleeding internal hemorrhoids. Recommendation:           -  Patient has a contact number available for                            emergencies. The signs and symptoms of potential                            delayed complications were discussed with the                            patient. Return to normal activities tomorrow.                            Written discharge instructions were provided to the                            patient.                           - Resume previous diet.                           - Continue present medications.                           - Await pathology results.                           - Repeat colonoscopy in 1 year because the bowel                            preparation was suboptimal. Plan for extended 2-day                            bowel prep for repeat colonoscopy.                           - Return to GI clinic PRN.                           - Use fiber, for example Citrucel, Fibercon, Konsyl                            or Metamucil.                           - Internal hemorrhoids were noted on this study and                            may be amenable to hemorrhoid band ligation. If you                            are interested in further treatment of these                            hemorrhoids with band ligation, please contact my  clinic to set up an appointment for evaluation and                            treatment. Gerrit Heck, MD 01/15/2021 11:41:47 AM

## 2021-01-15 NOTE — Progress Notes (Signed)
Pt's states no medical or surgical changes since previsit or office visit.  CW vitals and JD IV. 

## 2021-01-17 ENCOUNTER — Telehealth: Payer: Self-pay

## 2021-01-17 NOTE — Telephone Encounter (Signed)
  Follow up Call-  Call back number 01/15/2021  Post procedure Call Back phone  # [phone]  Permission to leave phone message Yes  Some recent data might be hidden     Patient questions:  Do you have a fever, pain , or abdominal swelling? No. Pain Score  0 *  Have you tolerated food without any problems? Yes.    Have you been able to return to your normal activities? Yes.    Do you have any questions about your discharge instructions: Diet   No. Medications  No. Follow up visit  No.  Do you have questions or concerns about your Care? No.  Actions: * If pain score is 4 or above: No action needed, pain <4.  1. Have you developed a fever since your procedure? no  2.   Have you had an respiratory symptoms (SOB or cough) since your procedure? no  3.   Have you tested positive for COVID 19 since your procedure no  4.   Have you had any family members/close contacts diagnosed with the COVID 19 since your procedure?  no   If yes to any of these questions please route to Joylene John, RN and Joella Prince, RN

## 2021-01-23 ENCOUNTER — Other Ambulatory Visit (INDEPENDENT_AMBULATORY_CARE_PROVIDER_SITE_OTHER): Payer: Self-pay | Admitting: Primary Care

## 2021-01-23 DIAGNOSIS — N3281 Overactive bladder: Secondary | ICD-10-CM

## 2021-01-23 MED ORDER — OXYBUTYNIN CHLORIDE ER 10 MG PO TB24: 10.0000 mg | ORAL_TABLET | Freq: Every day | ORAL | 1 refills | Status: DC

## 2021-01-27 ENCOUNTER — Encounter: Payer: Self-pay | Admitting: Gastroenterology

## 2021-01-30 ENCOUNTER — Other Ambulatory Visit (INDEPENDENT_AMBULATORY_CARE_PROVIDER_SITE_OTHER): Payer: Self-pay | Admitting: Primary Care

## 2021-01-30 ENCOUNTER — Telehealth (INDEPENDENT_AMBULATORY_CARE_PROVIDER_SITE_OTHER): Payer: Self-pay

## 2021-01-30 DIAGNOSIS — Z13820 Encounter for screening for osteoporosis: Secondary | ICD-10-CM

## 2021-01-30 NOTE — Telephone Encounter (Signed)
Sent to PCP ?

## 2021-01-30 NOTE — Telephone Encounter (Signed)
Sent message to Sleep study await response

## 2021-01-30 NOTE — Telephone Encounter (Signed)
Copied from [place] [id number]. Topic: General - Other >> Jan 27, 2021 10:40 AM Alanda Slim E wrote: Reason for CRM: Pt called and stated that the place that is doing her bone density has been trying to contact office due to the order having something wrong with the diagnoses and stopping them from scheduling appt / please contact them   Pt also received the approval letter for sleep apnea but they need to speak with Sharyn Lull before they proceed/ please advise

## 2021-02-05 ENCOUNTER — Other Ambulatory Visit (INDEPENDENT_AMBULATORY_CARE_PROVIDER_SITE_OTHER): Payer: Self-pay | Admitting: Primary Care

## 2021-03-04 DIAGNOSIS — Z1231 Encounter for screening mammogram for malignant neoplasm of breast: Secondary | ICD-10-CM | POA: Diagnosis not present

## 2021-03-18 ENCOUNTER — Other Ambulatory Visit: Payer: Medicare HMO

## 2021-03-23 ENCOUNTER — Other Ambulatory Visit (INDEPENDENT_AMBULATORY_CARE_PROVIDER_SITE_OTHER): Payer: Self-pay | Admitting: Primary Care

## 2021-03-23 DIAGNOSIS — N3281 Overactive bladder: Secondary | ICD-10-CM

## 2021-03-23 DIAGNOSIS — Z760 Encounter for issue of repeat prescription: Secondary | ICD-10-CM

## 2021-04-04 ENCOUNTER — Other Ambulatory Visit (INDEPENDENT_AMBULATORY_CARE_PROVIDER_SITE_OTHER): Payer: Self-pay | Admitting: Primary Care

## 2021-04-08 DIAGNOSIS — E119 Type 2 diabetes mellitus without complications: Secondary | ICD-10-CM | POA: Diagnosis not present

## 2021-05-16 ENCOUNTER — Other Ambulatory Visit (INDEPENDENT_AMBULATORY_CARE_PROVIDER_SITE_OTHER): Payer: Self-pay | Admitting: Primary Care

## 2021-05-16 DIAGNOSIS — Z760 Encounter for issue of repeat prescription: Secondary | ICD-10-CM

## 2021-05-26 ENCOUNTER — Other Ambulatory Visit (INDEPENDENT_AMBULATORY_CARE_PROVIDER_SITE_OTHER): Payer: Self-pay | Admitting: Primary Care

## 2021-05-26 NOTE — Telephone Encounter (Signed)
Refilled 05/21/21 too soon

## 2021-05-28 ENCOUNTER — Other Ambulatory Visit (INDEPENDENT_AMBULATORY_CARE_PROVIDER_SITE_OTHER): Payer: Self-pay | Admitting: Primary Care

## 2021-05-28 DIAGNOSIS — Z760 Encounter for issue of repeat prescription: Secondary | ICD-10-CM

## 2021-05-28 NOTE — Telephone Encounter (Signed)
Requested Prescriptions  Pending Prescriptions Disp Refills  . atorvastatin (LIPITOR) 20 MG tablet [Pharmacy Med Name: ATORVASTATIN CALCIUM 20 MG Tablet] 90 tablet 1    Sig: TAKE 1 TABLET EVERY DAY     Cardiovascular:  Antilipid - Statins Passed - 05/28/2021  7:03 PM      Passed - Total Cholesterol in normal range and within 360 days    Cholesterol, Total  Date Value Ref Range Status  10/28/2020 134 100 - 199 mg/dL Final         Passed - LDL in normal range and within 360 days    LDL Chol Calc (NIH)  Date Value Ref Range Status  10/28/2020 62 0 - 99 mg/dL Final         Passed - HDL in normal range and within 360 days    HDL  Date Value Ref Range Status  10/28/2020 48 >39 mg/dL Final         Passed - Triglycerides in normal range and within 360 days    Triglycerides  Date Value Ref Range Status  10/28/2020 138 0 - 149 mg/dL Final         Passed - Patient is not pregnant      Passed - Valid encounter within last 12 months    Recent Outpatient Visits          7 months ago Type 2 diabetes mellitus without complication, without long-term current use of insulin ([place])   [place] [facility] Kerin Perna, NP   1 year ago Overactive bladder   [place], [place], NP   1 year ago Type 2 diabetes mellitus without complication, without long-term current use of insulin ([place])   [place], [place], NP   1 year ago Hyperglycemia   [place], RPH-CPP   1 year ago Hyperglycemia   [place], RPH-CPP

## 2021-05-31 ENCOUNTER — Other Ambulatory Visit (INDEPENDENT_AMBULATORY_CARE_PROVIDER_SITE_OTHER): Payer: Self-pay | Admitting: Primary Care

## 2021-05-31 DIAGNOSIS — I10 Essential (primary) hypertension: Secondary | ICD-10-CM

## 2021-05-31 DIAGNOSIS — Z760 Encounter for issue of repeat prescription: Secondary | ICD-10-CM

## 2021-05-31 NOTE — Telephone Encounter (Signed)
Requested Prescriptions  Pending Prescriptions Disp Refills  . amLODipine (NORVASC) 10 MG tablet [Pharmacy Med Name: AMLODIPINE BESYLATE 10 MG Tablet] 30 tablet 0    Sig: TAKE 1 TABLET EVERY DAY     Cardiovascular:  Calcium Channel Blockers Failed - 05/31/2021  3:16 AM      Failed - Last BP in normal range    BP Readings from Last 1 Encounters:  01/15/21 (!) 146/85         Failed - Valid encounter within last 6 months    Recent Outpatient Visits          7 months ago Type 2 diabetes mellitus without complication, without long-term current use of insulin ([place])   [place] [facility] Kerin Perna, NP   1 year ago Overactive bladder   [place] Kerin Perna, NP   1 year ago Type 2 diabetes mellitus without complication, without long-term current use of insulin ([place])   [place], [place], NP   1 year ago Hyperglycemia   [place], RPH-CPP   1 year ago Hyperglycemia   [place], RPH-CPP             . metoprolol tartrate (LOPRESSOR) 25 MG tablet [Pharmacy Med Name: METOPROLOL TARTRATE 25 MG Tablet] 60 tablet 0    Sig: TAKE 1 TABLET TWICE DAILY     Cardiovascular:  Beta Blockers Failed - 05/31/2021  3:16 AM      Failed - Last BP in normal range    BP Readings from Last 1 Encounters:  01/15/21 (!) 146/85         Failed - Valid encounter within last 6 months    Recent Outpatient Visits          7 months ago Type 2 diabetes mellitus without complication, without long-term current use of insulin ([place])   [place] [facility] Kerin Perna, NP   1 year ago Overactive bladder   [place] Kerin Perna, NP   1 year ago Type 2 diabetes mellitus without complication, without long-term current use of insulin ([place])   [place], [place], NP   1 year ago Hyperglycemia   [place], RPH-CPP   1 year ago Hyperglycemia   [place], Jarome [place], RPH-CPP             Passed - Last Heart Rate in normal range    Pulse Readings from Last 1 Encounters:  01/15/21 76

## 2021-06-03 ENCOUNTER — Other Ambulatory Visit: Payer: Self-pay | Admitting: Family Medicine

## 2021-06-09 ENCOUNTER — Other Ambulatory Visit (INDEPENDENT_AMBULATORY_CARE_PROVIDER_SITE_OTHER): Payer: Self-pay | Admitting: *Deleted

## 2021-06-09 ENCOUNTER — Other Ambulatory Visit (INDEPENDENT_AMBULATORY_CARE_PROVIDER_SITE_OTHER): Payer: Self-pay | Admitting: Primary Care

## 2021-06-09 DIAGNOSIS — Z760 Encounter for issue of repeat prescription: Secondary | ICD-10-CM

## 2021-06-09 DIAGNOSIS — N3281 Overactive bladder: Secondary | ICD-10-CM

## 2021-06-09 MED ORDER — IBUPROFEN 600 MG PO TABS: 600.0000 mg | ORAL_TABLET | Freq: Three times a day (TID) | ORAL | 0 refills | Status: DC | PRN

## 2021-06-09 MED ORDER — AMITRIPTYLINE HCL 50 MG PO TABS: 50.0000 mg | ORAL_TABLET | Freq: Every day | ORAL | 1 refills | Status: DC

## 2021-06-09 MED ORDER — ESOMEPRAZOLE MAGNESIUM 40 MG PO CPDR: DELAYED_RELEASE_CAPSULE | ORAL | 0 refills | Status: DC

## 2021-06-09 MED ORDER — OXYBUTYNIN CHLORIDE ER 10 MG PO TB24: 10.0000 mg | ORAL_TABLET | Freq: Every day | ORAL | 1 refills | Status: DC

## 2021-06-12 ENCOUNTER — Other Ambulatory Visit (INDEPENDENT_AMBULATORY_CARE_PROVIDER_SITE_OTHER): Payer: Self-pay | Admitting: Primary Care

## 2021-06-12 DIAGNOSIS — Z760 Encounter for issue of repeat prescription: Secondary | ICD-10-CM

## 2021-06-24 ENCOUNTER — Other Ambulatory Visit (INDEPENDENT_AMBULATORY_CARE_PROVIDER_SITE_OTHER): Payer: Self-pay | Admitting: Primary Care

## 2021-06-24 DIAGNOSIS — I10 Essential (primary) hypertension: Secondary | ICD-10-CM

## 2021-06-24 DIAGNOSIS — Z760 Encounter for issue of repeat prescription: Secondary | ICD-10-CM

## 2021-06-24 NOTE — Telephone Encounter (Signed)
Requested medications are due for refill today yes  Requested medications are on the active medication list yes  Last refill 06/03/21  Future visit scheduled no  Notes to clinic Has already had a curtesy refill and there is no upcoming appointment scheduled.

## 2021-07-03 ENCOUNTER — Encounter (INDEPENDENT_AMBULATORY_CARE_PROVIDER_SITE_OTHER): Payer: Self-pay | Admitting: Primary Care

## 2021-07-03 ENCOUNTER — Ambulatory Visit (INDEPENDENT_AMBULATORY_CARE_PROVIDER_SITE_OTHER): Payer: Medicare HMO | Admitting: Primary Care

## 2021-07-03 ENCOUNTER — Other Ambulatory Visit: Payer: Self-pay

## 2021-07-03 VITALS — BP 118/69 | HR 82 | Temp 97.3°F | Ht 67.0 in | Wt 237.2 lb

## 2021-07-03 DIAGNOSIS — E119 Type 2 diabetes mellitus without complications: Secondary | ICD-10-CM

## 2021-07-03 DIAGNOSIS — I10 Essential (primary) hypertension: Secondary | ICD-10-CM | POA: Diagnosis not present

## 2021-07-03 DIAGNOSIS — Z760 Encounter for issue of repeat prescription: Secondary | ICD-10-CM | POA: Diagnosis not present

## 2021-07-03 DIAGNOSIS — E6609 Other obesity due to excess calories: Secondary | ICD-10-CM | POA: Diagnosis not present

## 2021-07-03 DIAGNOSIS — Z6835 Body mass index (BMI) 35.0-35.9, adult: Secondary | ICD-10-CM | POA: Diagnosis not present

## 2021-07-03 DIAGNOSIS — N3281 Overactive bladder: Secondary | ICD-10-CM

## 2021-07-03 DIAGNOSIS — K219 Gastro-esophageal reflux disease without esophagitis: Secondary | ICD-10-CM | POA: Diagnosis not present

## 2021-07-03 DIAGNOSIS — G4700 Insomnia, unspecified: Secondary | ICD-10-CM

## 2021-07-03 LAB — POCT GLYCOSYLATED HEMOGLOBIN (HGB A1C): Hemoglobin A1C: 7.3 % — AB (ref 4.0–5.6)

## 2021-07-03 MED ORDER — AMLODIPINE BESYLATE 10 MG PO TABS: ORAL_TABLET | ORAL | 1 refills | Status: DC

## 2021-07-03 MED ORDER — METOPROLOL TARTRATE 25 MG PO TABS: 25.0000 mg | ORAL_TABLET | Freq: Two times a day (BID) | ORAL | 1 refills | Status: DC

## 2021-07-03 MED ORDER — LOSARTAN POTASSIUM-HCTZ 100-25 MG PO TABS: 1.0000 | ORAL_TABLET | Freq: Every day | ORAL | 1 refills | Status: DC

## 2021-07-03 MED ORDER — AMITRIPTYLINE HCL 50 MG PO TABS: 50.0000 mg | ORAL_TABLET | Freq: Every day | ORAL | 1 refills | Status: DC

## 2021-07-03 MED ORDER — JANUMET 50-1000 MG PO TABS
1.0000 | ORAL_TABLET | Freq: Two times a day (BID) | ORAL | 0 refills | Status: DC
Start: 2021-07-03 — End: 2021-09-07

## 2021-07-03 NOTE — Progress Notes (Signed)
Subjective:  Patient ID: Amber Meyer, female    DOB: 09-22-1956  Age: 65 y.o. MRN: [id number]  CC: Follow-up (diabetes)   HPI Amber Meyer is a obese female who presents forFollow-up of diabetes. Patient does not check blood sugar at home  Compliant with meds - Yes Checking CBGs? No  Fasting avg -   Postprandial average -  Exercising regularly? - Yes Watching carbohydrate intake? - Yes Neuropathy ? - Yes Hypoglycemic events - Yes  - Recovers with :   Pertinent ROS:  Polyuria - No Polydipsia - No Vision problems - No  Medications as noted below. Taking them regularly without complication/adverse reaction being reported today.   History Amber Meyer has a past medical history of Arthritis, Cataract, GERD (gastroesophageal reflux disease), Hyperlipidemia, Hypertension, Renal disorder, Sinus bradycardia seen on cardiac monitor, and Urinary frequency.   She has a past surgical history that includes Abdominal hysterectomy (1981); Knee surgery (Right, 2013); Lithotripsy (2003, 2006); Kidney stone removal (Left, 1999); Knee Arthroplasty (Right, 12/23/2017); and Colonoscopy.   Her family history includes Alzheimer's disease in her father; Aneurysm in her mother.She reports that she has been smoking cigarettes. She has been smoking an average of .5 packs per day. She has never used smokeless tobacco. She reports that she does not drink alcohol and does not use drugs.  Current Outpatient Medications on File Prior to Visit  Medication Sig Dispense Refill   albuterol (VENTOLIN HFA) 108 (90 Base) MCG/ACT inhaler INHALE 2 PUFFS INTO THE LUNGS EVERY 4 HOURS AS NEEDED FOR WHEEZING 1 each 2   atorvastatin (LIPITOR) 20 MG tablet TAKE 1 TABLET EVERY DAY 90 tablet 1   Blood Glucose Monitoring Suppl (TRUE METRIX METER) w/Device KIT Use as instructed. 1 kit 0   esomeprazole (NEXIUM) 40 MG capsule TAKE 1 CAPSULE EVERY DAY AT NOON 90 capsule 0   Multiple Vitamins-Calcium (ONE-A-DAY  WOMENS PO) Take 1 tablet by mouth daily.     oxybutynin (DITROPAN-XL) 10 MG 24 hr tablet Take 1 tablet (10 mg total) by mouth at bedtime. 90 tablet 1   TRUE METRIX BLOOD GLUCOSE TEST test strip TEST BLOOD SUGAR EVERY DAY 100 strip 1   TRUEplus Lancets 28G MISC Use as instructed. 100 each 11   Current Facility-Administered Medications on File Prior to Visit  Medication Dose Route Frequency Provider Last Rate Last Admin   0.9 %  sodium chloride infusion  500 mL Intravenous Once Cirigliano, Vito V, DO        ROS Review of Systems  Gastrointestinal:  Positive for abdominal distention.       Gastric reflex/bloating   Genitourinary:        OAB  All other systems reviewed and are negative.  Objective:  BP 118/69 (BP Location: Right Arm, Patient Position: Sitting, Cuff Size: Large)   Pulse 82   Temp (!) 97.3 F (36.3 C) (Temporal)   Ht _0  (1.702 m)   Wt 237 lb 3.2 oz (107.6 kg)   SpO2 96%   BMI 37.15 kg/m   BP Readings from Last 3 Encounters:  07/03/21 118/69  01/15/21 (!) 146/85  10/28/20 127/80    Wt Readings from Last 3 Encounters:  07/03/21 237 lb 3.2 oz (107.6 kg)  01/15/21 223 lb (101.2 kg)  01/01/21 223 lb (101.2 kg)    Physical Exam General: Vital signs reviewed.  Patient is well-developed and well-nourished, obese female ,in no acute distress and cooperative with exam.  Head: Normocephalic and atraumatic. Eyes: EOMI,  conjunctivae normal, no scleral icterus.  Neck: Supple, trachea midline, normal ROM, no JVD, masses, thyromegaly, or carotid bruit present.  Cardiovascular: RRR, S1 normal, S2 normal, no murmurs, gallops, or rubs. Pulmonary/Chest: Clear to auscultation bilaterally, no wheezes, rales, or rhonchi. Abdominal: Soft, non-tender, non-distended, BS +, no masses, organomegaly, or guarding present.  Musculoskeletal: No joint deformities, erythema, or stiffness, ROM full and nontender. Extremities: No lower extremity edema bilaterally,  pulses symmetric and  intact bilaterally. No cyanosis or clubbing. Neurological: A&O x3, Strength is normal and symmetric bilaterally, no focal motor deficit, sensory intact to light touch bilaterally.  Skin: Warm, dry and intact. No rashes or erythema. Psychiatric: Normal mood and affect. speech and behavior is normal. Cognition and memory are normal.   Lab Results  Component Value Date   HGBA1C 7.3 (A) 07/03/2021   HGBA1C 6.7 (A) 10/28/2020   HGBA1C 5.8 (A) 05/01/2020    Lab Results  Component Value Date   WBC 11.0 (H) 10/28/2020   HGB 13.6 10/28/2020   HCT 40.1 10/28/2020   PLT 396 10/28/2020   GLUCOSE 98 10/28/2020   CHOL 134 10/28/2020   TRIG 138 10/28/2020   HDL 48 10/28/2020   LDLCALC 62 10/28/2020   ALT 13 10/28/2020   AST 11 10/28/2020   NA 142 10/28/2020   K 3.7 10/28/2020   CL 102 10/28/2020   CREATININE 1.01 (H) 10/28/2020   BUN 19 10/28/2020   CO2 26 10/28/2020   TSH 0.597 02/16/2018   HGBA1C 7.3 (A) 07/03/2021     Assessment & Plan:   Amber Meyer was seen today for follow-up.  Diagnoses and all orders for this visit:  Type 2 diabetes mellitus without complication, without long-term current use of insulin (HCC) Amber Meyer was seen today for follow-up.  Diagnoses and all orders for this visit:  Type 2 diabetes mellitus without complication, without long-term current use of insulin (HCC) -     HgB A1c -     CBC with Differential/Platelet -     Lipid panel -     sitaGLIPtin-metformin (JANUMET) 50-1000 MG tablet; Take 1 tablet by mouth 2 (two) times daily with a meal. -     TSH + free T4  Essential hypertension BP goal - < 130/80 which you have met on quadruple therapy  Explained that having normal blood pressure is the goal and medications are helping to get to goal and maintain normal blood pressure. DIET: Limit salt intake, read nutrition labels to check salt content, limit fried and high fatty foods  Avoid using multisymptom OTC cold preparations that generally contain  sudafed which can rise BP. Consult with pharmacist on best cold relief products to use for persons with HTN EXERCISE Discussed incorporating exercise such as walking - 30 minutes most days of the week and can do in 10 minute intervals    -     Comprehensive metabolic panel -     amLODipine (NORVASC) 10 MG tablet; TAKE 1 TABLET EVERY DAY (NEED MD APPOINTMENT) -     losartan-hydrochlorothiazide (HYZAAR) 100-25 MG tablet; Take 1 tablet by mouth daily. -     metoprolol tartrate (LOPRESSOR) 25 MG tablet; Take 1 tablet (25 mg total) by mouth 2 (two) times daily.  Medication refill -     amitriptyline (ELAVIL) 50 MG tablet; Take 1 tablet (50 mg total) by mouth at bedtime. -     amLODipine (NORVASC) 10 MG tablet; TAKE 1 TABLET EVERY DAY (NEED MD APPOINTMENT) -     losartan-hydrochlorothiazide (  HYZAAR) 100-25 MG tablet; Take 1 tablet by mouth daily. -     metoprolol tartrate (LOPRESSOR) 25 MG tablet; Take 1 tablet (25 mg total) by mouth 2 (two) times daily.  Class 2 obesity due to excess calories without serious comorbidity with body mass index (BMI) of 35.0 to 35.9 in adult Obesity is 30-39 indicating an excess in caloric intake or underlining conditions. This may lead to other co-morbidities. Lifestyle modifications of diet and exercise may reduce obesity.    Overactive bladder -     amitriptyline (ELAVIL) 50 MG tablet; Take 1 tablet (50 mg total) by mouth at bedtime.  Gastroesophageal reflux disease without esophagitis Discussed eating small frequent meal, reduction in acidic foods, fried foods ,spicy foods, alcohol caffeine and tobacco and certain medications. Avoid laying down after eating 63mns-1hour, elevated head of the bed.   Other orders -     Cancel: MM DIGITAL SCREENING BILATERAL; Future    I have discontinued PMardene CelesteA. Holstad's co-enzyme Q-10, metFORMIN, and ibuprofen. I have also changed her metoprolol tartrate. Additionally, I am having her start on Janumet. Lastly, I am having  her maintain her Multiple Vitamins-Calcium (ONE-A-DAY WOMENS PO), True Metrix Meter, TRUEplus Lancets 28G, albuterol, atorvastatin, True Metrix Blood Glucose Test, esomeprazole, oxybutynin, amitriptyline, amLODipine, and losartan-hydrochlorothiazide. We will continue to administer sodium chloride.  Meds ordered this encounter  Medications   amitriptyline (ELAVIL) 50 MG tablet    Sig: Take 1 tablet (50 mg total) by mouth at bedtime.    Dispense:  90 tablet    Refill:  1   amLODipine (NORVASC) 10 MG tablet    Sig: TAKE 1 TABLET EVERY DAY (NEED MD APPOINTMENT)    Dispense:  90 tablet    Refill:  1   losartan-hydrochlorothiazide (HYZAAR) 100-25 MG tablet    Sig: Take 1 tablet by mouth daily.    Dispense:  90 tablet    Refill:  1   sitaGLIPtin-metformin (JANUMET) 50-1000 MG tablet    Sig: Take 1 tablet by mouth 2 (two) times daily with a meal.    Dispense:  180 tablet    Refill:  0   metoprolol tartrate (LOPRESSOR) 25 MG tablet    Sig: Take 1 tablet (25 mg total) by mouth 2 (two) times daily.    Dispense:  180 tablet    Refill:  1     Follow-up:   Return in about 3 months (around 10/03/2021) for DM.  The above assessment and management plan was discussed with the patient. The patient verbalized understanding of and has agreed to the management plan. Patient is aware to call the clinic if symptoms fail to improve or worsen. Patient is aware when to return to the clinic for a follow-up visit. Patient educated on when it is appropriate to go to the emergency department.   MJuluis Mire NP-C

## 2021-07-03 NOTE — Patient Instructions (Signed)

## 2021-07-04 LAB — COMPREHENSIVE METABOLIC PANEL
ALT: 19 IU/L (ref 0–32)
AST: 16 IU/L (ref 0–40)
Albumin/Globulin Ratio: 1.8 (ref 1.2–2.2)
Albumin: 4.6 g/dL (ref 3.8–4.8)
Alkaline Phosphatase: 166 IU/L — ABNORMAL HIGH (ref 44–121)
BUN/Creatinine Ratio: 15 (ref 12–28)
BUN: 15 mg/dL (ref 8–27)
Bilirubin Total: 0.3 mg/dL (ref 0.0–1.2)
CO2: 25 mmol/L (ref 20–29)
Calcium: 10.6 mg/dL — ABNORMAL HIGH (ref 8.7–10.3)
Chloride: 99 mmol/L (ref 96–106)
Creatinine, Ser: 1.01 mg/dL — ABNORMAL HIGH (ref 0.57–1.00)
Globulin, Total: 2.5 g/dL (ref 1.5–4.5)
Glucose: 131 mg/dL — ABNORMAL HIGH (ref 65–99)
Potassium: 3.9 mmol/L (ref 3.5–5.2)
Sodium: 139 mmol/L (ref 134–144)
Total Protein: 7.1 g/dL (ref 6.0–8.5)
eGFR: 61 mL/min/{1.73_m2} (ref >59)

## 2021-07-04 LAB — CBC WITH DIFFERENTIAL/PLATELET
Basophils Absolute: 0.1 10*3/uL (ref 0.0–0.2)
Basos: 1 %
EOS (ABSOLUTE): 0.5 10*3/uL — ABNORMAL HIGH (ref 0.0–0.4)
Eos: 5 %
Hematocrit: 40 % (ref 34.0–46.6)
Hemoglobin: 13.3 g/dL (ref 11.1–15.9)
Immature Grans (Abs): 0 10*3/uL (ref 0.0–0.1)
Immature Granulocytes: 0 %
Lymphocytes Absolute: 3 10*3/uL (ref 0.7–3.1)
Lymphs: 33 %
MCH: 29 pg (ref 26.6–33.0)
MCHC: 33.3 g/dL (ref 31.5–35.7)
MCV: 87 fL (ref 79–97)
Monocytes Absolute: 0.5 10*3/uL (ref 0.1–0.9)
Monocytes: 5 %
Neutrophils Absolute: 5 10*3/uL (ref 1.4–7.0)
Neutrophils: 56 %
Platelets: 379 10*3/uL (ref 150–450)
RBC: 4.59 x10E6/uL (ref 3.77–5.28)
RDW: 14 % (ref 11.7–15.4)
WBC: 9 10*3/uL (ref 3.4–10.8)

## 2021-07-04 LAB — LIPID PANEL
Chol/HDL Ratio: 2.7 ratio (ref 0.0–4.4)
Cholesterol, Total: 117 mg/dL (ref 100–199)
HDL: 44 mg/dL (ref >39)
LDL Chol Calc (NIH): 43 mg/dL (ref 0–99)
Triglycerides: 186 mg/dL — ABNORMAL HIGH (ref 0–149)
VLDL Cholesterol Cal: 30 mg/dL (ref 5–40)

## 2021-07-04 LAB — TSH+FREE T4
Free T4: 1.17 ng/dL (ref 0.82–1.77)
TSH: 1.16 u[IU]/mL (ref 0.450–4.500)

## 2021-07-10 ENCOUNTER — Ambulatory Visit (INDEPENDENT_AMBULATORY_CARE_PROVIDER_SITE_OTHER): Payer: Self-pay | Admitting: *Deleted

## 2021-07-10 NOTE — Telephone Encounter (Signed)
Pt stated she was recently prescribed Janumet but she is unsure if she should continue to take the metformin also or if should only be taking the Janumet. Pt requests call back. Cb# [phone]   Per patient OV-    Metformin has been discontinued Patient has been notified. Reason for Disposition  Caller has medicine question only, adult not sick, AND triager answers question  Answer Assessment - Initial Assessment Questions 1. NAME of MEDICATION: "What medicine are you calling about?"     Janumet and Metformin 2. QUESTION: "What is your question?" (e.g., double dose of medicine, side effect)     Does she take both or stop metformin 3. PRESCRIBING HCP: "Who prescribed it?" Reason: if prescribed by specialist, call should be referred to that group.     PCP  Protocols used: Medication Question Call-A-AH

## 2021-07-17 ENCOUNTER — Other Ambulatory Visit (INDEPENDENT_AMBULATORY_CARE_PROVIDER_SITE_OTHER): Payer: Self-pay | Admitting: Primary Care

## 2021-07-17 NOTE — Telephone Encounter (Signed)
Sent to PCP ?

## 2021-07-31 ENCOUNTER — Telehealth (INDEPENDENT_AMBULATORY_CARE_PROVIDER_SITE_OTHER): Payer: Self-pay

## 2021-07-31 ENCOUNTER — Other Ambulatory Visit (INDEPENDENT_AMBULATORY_CARE_PROVIDER_SITE_OTHER): Payer: Self-pay | Admitting: Nurse Practitioner

## 2021-07-31 MED ORDER — ESOMEPRAZOLE MAGNESIUM 40 MG PO CPDR: DELAYED_RELEASE_CAPSULE | ORAL | 0 refills | Status: DC

## 2021-07-31 NOTE — Telephone Encounter (Signed)
Prescription sent. Thanks

## 2021-07-31 NOTE — Telephone Encounter (Signed)
Copied from [place] [id number]. Topic: Quick Communication - Rx Refill/Question >> Jul 31, 2021  3:51 PM [place], Apolonio Schneiders wrote: Pharmacy was following up a fax they sent over last week for esomeprazole (NEXIUM) 40 MG capsule, caller stated they need a new script to refill this, please advise.   Please send refill to [place] if appropriate. Nat Christen, CMA

## 2021-08-09 ENCOUNTER — Ambulatory Visit (INDEPENDENT_AMBULATORY_CARE_PROVIDER_SITE_OTHER): Payer: Medicare HMO

## 2021-08-09 DIAGNOSIS — Z0000 Encounter for general adult medical examination without abnormal findings: Secondary | ICD-10-CM | POA: Diagnosis not present

## 2021-08-09 NOTE — Progress Notes (Deleted)
Subjective:   Amber Meyer is a 65 y.o. female who presents for an Initial Medicare Annual Wellness Visit.  Review of Systems    ***       Objective:    There were no vitals filed for this visit. There is no height or weight on file to calculate BMI.  Advanced Directives 06/20/2020 09/21/2019 12/23/2017 12/17/2017 01/16/2015  Does Patient Have a Medical Advance Directive? No No No No No  Would patient like information on creating a medical advance directive? No - Patient declined - No - Patient declined No - Patient declined No - patient declined information    Current Medications (verified) Outpatient Encounter Medications as of 08/09/2021  Medication Sig  . albuterol (VENTOLIN HFA) 108 (90 Base) MCG/ACT inhaler INHALE 2 PUFFS INTO THE LUNGS EVERY 4 HOURS AS NEEDED FOR WHEEZING  . amitriptyline (ELAVIL) 50 MG tablet Take 1 tablet (50 mg total) by mouth at bedtime.  Marland Kitchen amLODipine (NORVASC) 10 MG tablet TAKE 1 TABLET EVERY DAY (NEED MD APPOINTMENT)  . atorvastatin (LIPITOR) 20 MG tablet TAKE 1 TABLET EVERY DAY  . Blood Glucose Monitoring Suppl (TRUE METRIX METER) w/Device KIT Use as instructed.  Marland Kitchen esomeprazole (NEXIUM) 40 MG capsule TAKE 1 CAPSULE EVERY DAY AT NOON  . losartan-hydrochlorothiazide (HYZAAR) 100-25 MG tablet Take 1 tablet by mouth daily.  . metoprolol tartrate (LOPRESSOR) 25 MG tablet Take 1 tablet (25 mg total) by mouth 2 (two) times daily.  . Multiple Vitamins-Calcium (ONE-A-DAY WOMENS PO) Take 1 tablet by mouth daily.  Marland Kitchen oxybutynin (DITROPAN-XL) 10 MG 24 hr tablet Take 1 tablet (10 mg total) by mouth at bedtime.  . sitaGLIPtin-metformin (JANUMET) 50-1000 MG tablet Take 1 tablet by mouth 2 (two) times daily with a meal.  . TRUE METRIX BLOOD GLUCOSE TEST test strip TEST BLOOD SUGAR EVERY DAY  . TRUEplus Lancets 30G MISC TEST BLOOD SUGAR EVERY DAY   Facility-Administered Encounter Medications as of 08/09/2021  Medication  . 0.9 %  sodium chloride infusion     Allergies (verified) Hydrocodone-acetaminophen   History: Past Medical History:  Diagnosis Date  . Arthritis   . Cataract    right   . GERD (gastroesophageal reflux disease)   . Hyperlipidemia   . Hypertension   . Renal disorder    3 kidney stones  . Sinus bradycardia seen on cardiac monitor   . Urinary frequency    Past Surgical History:  Procedure Laterality Date  . ABDOMINAL HYSTERECTOMY  1981   partial  . COLONOSCOPY    . Kidney stone removal Left 1999  . KNEE ARTHROPLASTY Right 12/23/2017   Procedure: RIGHT TOTAL KNEE ARTHROPLASTY WITH COMPUTER NAVIGATION;  Surgeon: Rod Can, MD;  Location: [facility];  Service: Orthopedics;  Laterality: Right;  NEEDS RNFA  . KNEE SURGERY Right 2013   cleaned out damaged tissue and arthritis  . LITHOTRIPSY  2003, 2006   x 2   Family History  Problem Relation Age of Onset  . Aneurysm Mother   . Alzheimer's disease Father   . Colon polyps Neg Hx   . Colon cancer Neg Hx   . Esophageal cancer Neg Hx   . Stomach cancer Neg Hx   . Rectal cancer Neg Hx    Social History   Socioeconomic History  . Marital status: Single    Spouse name: Not on file  . Number of children: Not on file  . Years of education: Not on file  . Highest education level: Not on  file  Occupational History  . Not on file  Tobacco Use  . Smoking status: Every Day    Packs/day: 0.50    Types: Cigarettes  . Smokeless tobacco: Never  . Tobacco comments:    down to 2 cigs a day   Vaping Use  . Vaping Use: Never used  Substance and Sexual Activity  . Alcohol use: No  . Drug use: No  . Sexual activity: Not Currently  Other Topics Concern  . Not on file  Social History Narrative  . Not on file   Social Determinants of Health   Financial Resource Strain: Not on file  Food Insecurity: Not on file  Transportation Needs: Not on file  Physical Activity: Not on file  Stress: Not on file  Social Connections: Not on file    Tobacco  Counseling Ready to quit: Not Answered Counseling given: Not Answered Tobacco comments: down to 2 cigs a day    Clinical Intake:                 Diabetic?***         Activities of Daily Living No flowsheet data found.  Patient Care Team: Kerin Perna, NP as PCP - General (Internal Medicine)  Indicate any recent Medical Services you may have received from other than [facility] providers in the past year (date may be approximate).     Assessment:   This is a routine wellness examination for Amber Meyer.  Hearing/Vision screen No results found.  Dietary issues and exercise activities discussed:     Goals Addressed   None   Depression Screen PHQ 2/9 Scores 07/03/2021 10/28/2020 05/01/2020 12/29/2019 09/28/2019 02/16/2018  PHQ - 2 Score 0 0 0 0 2 1  PHQ- 9 Score - - - 0 10 -    Fall Risk Fall Risk  07/03/2021 10/28/2020 05/01/2020 09/28/2019  Falls in the past year? 0 0 0 0    FALL RISK PREVENTION PERTAINING TO THE HOME:  Any stairs in or around the home? {YES/NO:21197}{} If so, are there any without handrails? {YES/NO:21197}{} Home free of loose throw rugs in walkways, pet beds, electrical cords, etc? {YES/NO:21197}{} Adequate lighting in your home to reduce risk of falls? {YES/NO:21197}{}  ASSISTIVE DEVICES UTILIZED TO PREVENT FALLS:  Life alert? {YES/NO:21197}{} Use of a cane, walker or w/c? {YES/NO:21197}{} Grab bars in the bathroom? {YES/NO:21197}{} Shower chair or bench in shower? {YES/NO:21197}{} Elevated toilet seat or a handicapped toilet? {YES/NO:21197}{}  TIMED UP AND GO:  Was the test performed? {YES/NO:21197}{}.  Length of time to ambulate 10 feet: *** sec.   {Appearance of Gait:2101803}{}  Cognitive Function:        Immunizations Immunization History  Administered Date(s) Administered  . Influenza,inj,Quad PF,6+ Mos 09/28/2019, 10/28/2020  . Influenza,inj,quad, With Preservative 11/15/2017  . Influenza-Unspecified 09/13/2016  .  PFIZER(Purple Top)SARS-COV-2 Vaccination 01/20/2020, 02/10/2020  . Pneumococcal Conjugate-13 10/28/2020  . Pneumococcal Polysaccharide-23 06/02/2019  . Tdap 06/02/2019    {TDAP status:2101805}{}  {Flu Vaccine status:2101806}{}  {Pneumococcal vaccine status:2101807}{}  {Covid-19 vaccine status:2101808}{}  Qualifies for Shingles Vaccine? {YES/NO:21197}{}  Zostavax completed {YES/NO:21197}{}  {Shingrix Completed?:2101804}{}  Screening Tests Health Maintenance  Topic Date Due  . DEXA SCAN  Never done  . COVID-19 Vaccine (3 - Booster for Pfizer series) 07/09/2020  . INFLUENZA VACCINE  07/14/2021  . Zoster Vaccines- Shingrix (1 of 2) 10/03/2021 (Originally 12/07/2004)  . MAMMOGRAM  03/11/2023  . PNA vac Low Risk Adult (2 of 2 - PPSV23) 06/01/2024  . TETANUS/TDAP  06/01/2029  .  COLONOSCOPY (Pts 45-42yr Insurance coverage will need to be confirmed)  01/15/2031  . Hepatitis C Screening  Completed  . HPV VACCINES  Aged Out    Health Maintenance  Health Maintenance Due  Topic Date Due  . DEXA SCAN  Never done  . COVID-19 Vaccine (3 - Booster for Pfizer series) 07/09/2020  . INFLUENZA VACCINE  07/14/2021    {Colorectal cancer screening:2101809}{}  {Mammogram status:21018020}{}  {Bone Density status:21018021}{}  Lung Cancer Screening: (Low Dose CT Chest recommended if Age 67-80years, 30 pack-year currently smoking OR have quit w/in 15years.) {DOES NOT does:27190::"does not"}{} qualify.   Lung Cancer Screening Referral: ***  Additional Screening:  Hepatitis C Screening: {DOES NOT does:27190::"does not"}{} qualify; Completed ***  Vision Screening: Recommended annual ophthalmology exams for early detection of glaucoma and other disorders of the eye. Is the patient up to date with their annual eye exam?  {YES/NO:21197}{} Who is the provider or what is the name of the office in which the patient attends annual eye exams? *** If pt is not established with a provider, would  they like to be referred to a provider to establish care? {YES/NO:21197}{}.   Dental Screening: Recommended annual dental exams for proper oral hygiene  Community Resource Referral / Chronic Care Management: CRR required this visit?  {YES/NO:21197}{}  CCM required this visit?  {YES/NO:21197}{}     Plan:     I have personally reviewed and noted the following in the patient's chart:   Medical and social history Use of alcohol, tobacco or illicit drugs  Current medications and supplements including opioid prescriptions. {Opioid Prescriptions:[phone]}{} Functional ability and status Nutritional status Physical activity Advanced directives List of other physicians Hospitalizations, surgeries, and ER visits in previous 12 months Vitals Screenings to include cognitive, depression, and falls Referrals and appointments  In addition, I have reviewed and discussed with patient certain preventive protocols, quality metrics, and best practice recommendations. A written personalized care plan for preventive services as well as general preventive health recommendations were provided to patient.     AThressa Sheller CDot [place]  08/09/2021   Nurse Notes: ***

## 2021-08-09 NOTE — Patient Instructions (Addendum)
Health Maintenance, Female Adopting a healthy lifestyle and getting preventive care are important in promoting health and wellness. Ask your health care provider about: The right schedule for you to have regular tests and exams. Things you can do on your own to prevent diseases and keep yourself healthy. What should I know about diet, weight, and exercise? Eat a healthy diet  Eat a diet that includes plenty of vegetables, fruits, low-fat dairy products, and lean protein. Do not eat a lot of foods that are high in solid fats, added sugars, or sodium.  Maintain a healthy weight Body mass index (BMI) is used to identify weight problems. It estimates body fat based on height and weight. Your health care provider can help determineyour BMI and help you achieve or maintain a healthy weight. Get regular exercise Get regular exercise. This is one of the most important things you can do for your health. Most adults should: Exercise for at least 150 minutes each week. The exercise should increase your heart rate and make you sweat (moderate-intensity exercise). Do strengthening exercises at least twice a week. This is in addition to the moderate-intensity exercise. Spend less time sitting. Even light physical activity can be beneficial. Watch cholesterol and blood lipids Have your blood tested for lipids and cholesterol at 67 years of age, then havethis test every 5 years. Have your cholesterol levels checked more often if: Your lipid or cholesterol levels are high. You are older than 67 years of age. You are at high risk for heart disease. What should I know about cancer screening? Depending on your health history and family history, you may need to have cancer screening at various ages. This may include screening for: Breast cancer. Cervical cancer. Colorectal cancer. Skin cancer. Lung cancer. What should I know about heart disease, diabetes, and high blood pressure? Blood pressure and heart  disease High blood pressure causes heart disease and increases the risk of stroke. This is more likely to develop in people who have high blood pressure readings, are of African descent, or are overweight. Have your blood pressure checked: Every 3-5 years if you are 18-39 years of age. Every year if you are 40 years old or older. Diabetes Have regular diabetes screenings. This checks your fasting blood sugar level. Have the screening done: Once every three years after age 40 if you are at a normal weight and have a low risk for diabetes. More often and at a younger age if you are overweight or have a high risk for diabetes. What should I know about preventing infection? Hepatitis B If you have a higher risk for hepatitis B, you should be screened for this virus. Talk with your health care provider to find out if you are at risk forhepatitis B infection. Hepatitis C Testing is recommended for: Everyone born from 1945 through 1965. Anyone with known risk factors for hepatitis C. Sexually transmitted infections (STIs) Get screened for STIs, including gonorrhea and chlamydia, if: You are sexually active and are younger than 67 years of age. You are older than 67 years of age and your health care provider tells you that you are at risk for this type of infection. Your sexual activity has changed since you were last screened, and you are at increased risk for chlamydia or gonorrhea. Ask your health care provider if you are at risk. Ask your health care provider about whether you are at high risk for HIV. Your health care provider may recommend a prescription medicine to help   prevent HIV infection. If you choose to take medicine to prevent HIV, you should first get tested for HIV. You should then be tested every 3 months for as long as you are taking the medicine. Pregnancy If you are about to stop having your period (premenopausal) and you may become pregnant, seek counseling before you get  pregnant. Take 400 to 800 micrograms (mcg) of folic acid every day if you become pregnant. Ask for birth control (contraception) if you want to prevent pregnancy. Osteoporosis and menopause Osteoporosis is a disease in which the bones lose minerals and strength with aging. This can result in bone fractures. If you are 21 years old or older, or if you are at risk for osteoporosis and fractures, ask your health care provider if you should: Be screened for bone loss. Take a calcium or vitamin D supplement to lower your risk of fractures. Be given hormone replacement therapy (HRT) to treat symptoms of menopause. Follow these instructions at home: Lifestyle Do not use any products that contain nicotine or tobacco, such as cigarettes, e-cigarettes, and chewing tobacco. If you need help quitting, ask your health care provider. Do not use street drugs. Do not share needles. Ask your health care provider for help if you need support or information about quitting drugs. Alcohol use Do not drink alcohol if: Your health care provider tells you not to drink. You are pregnant, may be pregnant, or are planning to become pregnant. If you drink alcohol: Limit how much you use to 0-1 drink a day. Limit intake if you are breastfeeding. Be aware of how much alcohol is in your drink. In the [place] one drink equals one 12 oz bottle of beer (355 mL), one 5 oz glass of wine (148 mL), or one 1 oz glass of hard liquor (44 mL). General instructions Schedule regular health, dental, and eye exams. Stay current with your vaccines. Tell your health care provider if: You often feel depressed. You have ever been abused or do not feel safe at home. Summary Adopting a healthy lifestyle and getting preventive care are important in promoting health and wellness. Follow your health care provider's instructions about healthy diet, exercising, and getting tested or screened for diseases. Follow your health care provider's  instructions on monitoring your cholesterol and blood pressure. This information is not intended to replace advice given to you by your health care provider. Make sure you discuss any questions you have with your healthcare provider. Document Revised: 11/23/2018 Document Reviewed: 11/23/2018 Elsevier Patient Education  2022 [place] Maintenance, Female Adopting a healthy lifestyle and getting preventive care are important in promoting health and wellness. Ask your health care provider about: The right schedule for you to have regular tests and exams. Things you can do on your own to prevent diseases and keep yourself healthy. What should I know about diet, weight, and exercise? Eat a healthy diet  Eat a diet that includes plenty of vegetables, fruits, low-fat dairy products, and lean protein. Do not eat a lot of foods that are high in solid fats, added sugars, or sodium.  Maintain a healthy weight Body mass index (BMI) is used to identify weight problems. It estimates body fat based on height and weight. Your health care provider can help determineyour BMI and help you achieve or maintain a healthy weight. Get regular exercise Get regular exercise. This is one of the most important things you can do for your health. Most adults should: Exercise for at least 150  minutes each week. The exercise should increase your heart rate and make you sweat (moderate-intensity exercise). Do strengthening exercises at least twice a week. This is in addition to the moderate-intensity exercise. Spend less time sitting. Even light physical activity can be beneficial. Watch cholesterol and blood lipids Have your blood tested for lipids and cholesterol at 65 years of age, then havethis test every 5 years. Have your cholesterol levels checked more often if: Your lipid or cholesterol levels are high. You are older than 67 years of age. You are at high risk for heart disease. What should I know  about cancer screening? Depending on your health history and family history, you may need to have cancer screening at various ages. This may include screening for: Breast cancer. Cervical cancer. Colorectal cancer. Skin cancer. Lung cancer. What should I know about heart disease, diabetes, and high blood pressure? Blood pressure and heart disease High blood pressure causes heart disease and increases the risk of stroke. This is more likely to develop in people who have high blood pressure readings, are of African descent, or are overweight. Have your blood pressure checked: Every 3-5 years if you are 50-50 years of age. Every year if you are 81 years old or older. Diabetes Have regular diabetes screenings. This checks your fasting blood sugar level. Have the screening done: Once every three years after age 68 if you are at a normal weight and have a low risk for diabetes. More often and at a younger age if you are overweight or have a high risk for diabetes. What should I know about preventing infection? Hepatitis B If you have a higher risk for hepatitis B, you should be screened for this virus. Talk with your health care provider to find out if you are at risk forhepatitis B infection. Hepatitis C Testing is recommended for: Everyone born from [birth year] through 1965. Anyone with known risk factors for hepatitis C. Sexually transmitted infections (STIs) Get screened for STIs, including gonorrhea and chlamydia, if: You are sexually active and are younger than 67 years of age. You are older than 67 years of age and your health care provider tells you that you are at risk for this type of infection. Your sexual activity has changed since you were last screened, and you are at increased risk for chlamydia or gonorrhea. Ask your health care provider if you are at risk. Ask your health care provider about whether you are at high risk for HIV. Your health care provider may recommend a prescription  medicine to help prevent HIV infection. If you choose to take medicine to prevent HIV, you should first get tested for HIV. You should then be tested every 3 months for as long as you are taking the medicine. Pregnancy If you are about to stop having your period (premenopausal) and you may become pregnant, seek counseling before you get pregnant. Take 400 to 800 micrograms (mcg) of folic acid every day if you become pregnant. Ask for birth control (contraception) if you want to prevent pregnancy. Osteoporosis and menopause Osteoporosis is a disease in which the bones lose minerals and strength with aging. This can result in bone fractures. If you are 67 years old or older, or if you are at risk for osteoporosis and fractures, ask your health care provider if you should: Be screened for bone loss. Take a calcium or vitamin D supplement to lower your risk of fractures. Be given hormone replacement therapy (HRT) to treat symptoms of menopause. Follow  these instructions at home: Lifestyle Do not use any products that contain nicotine or tobacco, such as cigarettes, e-cigarettes, and chewing tobacco. If you need help quitting, ask your health care provider. Do not use street drugs. Do not share needles. Ask your health care provider for help if you need support or information about quitting drugs. Alcohol use Do not drink alcohol if: Your health care provider tells you not to drink. You are pregnant, may be pregnant, or are planning to become pregnant. If you drink alcohol: Limit how much you use to 0-1 drink a day. Limit intake if you are breastfeeding. Be aware of how much alcohol is in your drink. In the [place] one drink equals one 12 oz bottle of beer (355 mL), one 5 oz glass of wine (148 mL), or one 1 oz glass of hard liquor (44 mL). General instructions Schedule regular health, dental, and eye exams. Stay current with your vaccines. Tell your health care provider if: You often feel  depressed. You have ever been abused or do not feel safe at home. Summary Adopting a healthy lifestyle and getting preventive care are important in promoting health and wellness. Follow your health care provider's instructions about healthy diet, exercising, and getting tested or screened for diseases. Follow your health care provider's instructions on monitoring your cholesterol and blood pressure. This information is not intended to replace advice given to you by your health care provider. Make sure you discuss any questions you have with your healthcare provider. Document Revised: 11/23/2018 Document Reviewed: 11/23/2018 Elsevier Patient Education  2022 [place] Maintenance, Female Adopting a healthy lifestyle and getting preventive care are important in promoting health and wellness. Ask your health care provider about: The right schedule for you to have regular tests and exams. Things you can do on your own to prevent diseases and keep yourself healthy. What should I know about diet, weight, and exercise? Eat a healthy diet  Eat a diet that includes plenty of vegetables, fruits, low-fat dairy products, and lean protein. Do not eat a lot of foods that are high in solid fats, added sugars, or sodium.  Maintain a healthy weight Body mass index (BMI) is used to identify weight problems. It estimates body fat based on height and weight. Your health care provider can help determineyour BMI and help you achieve or maintain a healthy weight. Get regular exercise Get regular exercise. This is one of the most important things you can do for your health. Most adults should: Exercise for at least 150 minutes each week. The exercise should increase your heart rate and make you sweat (moderate-intensity exercise). Do strengthening exercises at least twice a week. This is in addition to the moderate-intensity exercise. Spend less time sitting. Even light physical activity can be  beneficial. Watch cholesterol and blood lipids Have your blood tested for lipids and cholesterol at 65 years of age, then havethis test every 5 years. Have your cholesterol levels checked more often if: Your lipid or cholesterol levels are high. You are older than 67 years of age. You are at high risk for heart disease. What should I know about cancer screening? Depending on your health history and family history, you may need to have cancer screening at various ages. This may include screening for: Breast cancer. Cervical cancer. Colorectal cancer. Skin cancer. Lung cancer. What should I know about heart disease, diabetes, and high blood pressure? Blood pressure and heart disease High blood pressure causes heart disease and increases  the risk of stroke. This is more likely to develop in people who have high blood pressure readings, are of African descent, or are overweight. Have your blood pressure checked: Every 3-5 years if you are 52-68 years of age. Every year if you are 8 years old or older. Diabetes Have regular diabetes screenings. This checks your fasting blood sugar level. Have the screening done: Once every three years after age 8 if you are at a normal weight and have a low risk for diabetes. More often and at a younger age if you are overweight or have a high risk for diabetes. What should I know about preventing infection? Hepatitis B If you have a higher risk for hepatitis B, you should be screened for this virus. Talk with your health care provider to find out if you are at risk forhepatitis B infection. Hepatitis C Testing is recommended for: Everyone born from [birth year] through 1965. Anyone with known risk factors for hepatitis C. Sexually transmitted infections (STIs) Get screened for STIs, including gonorrhea and chlamydia, if: You are sexually active and are younger than 67 years of age. You are older than 67 years of age and your health care provider tells you  that you are at risk for this type of infection. Your sexual activity has changed since you were last screened, and you are at increased risk for chlamydia or gonorrhea. Ask your health care provider if you are at risk. Ask your health care provider about whether you are at high risk for HIV. Your health care provider may recommend a prescription medicine to help prevent HIV infection. If you choose to take medicine to prevent HIV, you should first get tested for HIV. You should then be tested every 3 months for as long as you are taking the medicine. Pregnancy If you are about to stop having your period (premenopausal) and you may become pregnant, seek counseling before you get pregnant. Take 400 to 800 micrograms (mcg) of folic acid every day if you become pregnant. Ask for birth control (contraception) if you want to prevent pregnancy. Osteoporosis and menopause Osteoporosis is a disease in which the bones lose minerals and strength with aging. This can result in bone fractures. If you are 15 years old or older, or if you are at risk for osteoporosis and fractures, ask your health care provider if you should: Be screened for bone loss. Take a calcium or vitamin D supplement to lower your risk of fractures. Be given hormone replacement therapy (HRT) to treat symptoms of menopause. Follow these instructions at home: Lifestyle Do not use any products that contain nicotine or tobacco, such as cigarettes, e-cigarettes, and chewing tobacco. If you need help quitting, ask your health care provider. Do not use street drugs. Do not share needles. Ask your health care provider for help if you need support or information about quitting drugs. Alcohol use Do not drink alcohol if: Your health care provider tells you not to drink. You are pregnant, may be pregnant, or are planning to become pregnant. If you drink alcohol: Limit how much you use to 0-1 drink a day. Limit intake if you are  breastfeeding. Be aware of how much alcohol is in your drink. In the [place] one drink equals one 12 oz bottle of beer (355 mL), one 5 oz glass of wine (148 mL), or one 1 oz glass of hard liquor (44 mL). General instructions Schedule regular health, dental, and eye exams. Stay current with your vaccines. Tell your  health care provider if: You often feel depressed. You have ever been abused or do not feel safe at home. Summary Adopting a healthy lifestyle and getting preventive care are important in promoting health and wellness. Follow your health care provider's instructions about healthy diet, exercising, and getting tested or screened for diseases. Follow your health care provider's instructions on monitoring your cholesterol and blood pressure. This information is not intended to replace advice given to you by your health care provider. Make sure you discuss any questions you have with your healthcare provider. Document Revised: 11/23/2018 Document Reviewed: 11/23/2018 Elsevier Patient Education  2022 [employer].

## 2021-08-09 NOTE — Progress Notes (Addendum)
Subjective:   Amber Meyer is a 65 y.o. female who presents for an Initial Medicare Annual Wellness Visit.  I connected with  Lynnae January on 08/09/21 by a audio enabled telemedicine application and verified that I am speaking with the correct person using two identifiers.   I discussed the limitations of evaluation and management by telemedicine. The patient expressed understanding and agreed to proceed. Location patient: home Location provider: work Persons participating in the virtual visit: patient, CMA     Review of Systems    Defer to PCP.  Cardiac Risk Factors include: diabetes mellitus;advanced age (>45mn, >>71women);hypertension     Objective:    Today's Vitals   08/09/21 0902  PainSc: 0-No pain   There is no height or weight on file to calculate BMI.  Advanced Directives 08/09/2021 06/20/2020 09/21/2019 12/23/2017 12/17/2017 01/16/2015  Does Patient Have a Medical Advance Directive? _0  No  Would patient like information on creating a medical advance directive? No - Patient declined No - Patient declined - No - Patient declined No - Patient declined No - patient declined information    Current Medications (verified) Outpatient Encounter Medications as of 08/09/2021  Medication Sig   albuterol (VENTOLIN HFA) 108 (90 Base) MCG/ACT inhaler INHALE 2 PUFFS INTO THE LUNGS EVERY 4 HOURS AS NEEDED FOR WHEEZING   amitriptyline (ELAVIL) 50 MG tablet Take 1 tablet (50 mg total) by mouth at bedtime.   amLODipine (NORVASC) 10 MG tablet TAKE 1 TABLET EVERY DAY (NEED MD APPOINTMENT)   atorvastatin (LIPITOR) 20 MG tablet TAKE 1 TABLET EVERY DAY   Blood Glucose Monitoring Suppl (TRUE METRIX METER) w/Device KIT Use as instructed.   esomeprazole (NEXIUM) 40 MG capsule TAKE 1 CAPSULE EVERY DAY AT NOON   losartan-hydrochlorothiazide (HYZAAR) 100-25 MG tablet Take 1 tablet by mouth daily.   metoprolol tartrate (LOPRESSOR) 25 MG tablet Take 1 tablet (25 mg total) by  mouth 2 (two) times daily.   Multiple Vitamins-Calcium (ONE-A-DAY WOMENS PO) Take 1 tablet by mouth daily.   oxybutynin (DITROPAN-XL) 10 MG 24 hr tablet Take 1 tablet (10 mg total) by mouth at bedtime.   sitaGLIPtin-metformin (JANUMET) 50-1000 MG tablet Take 1 tablet by mouth 2 (two) times daily with a meal.   TRUE METRIX BLOOD GLUCOSE TEST test strip TEST BLOOD SUGAR EVERY DAY   TRUEplus Lancets 30G MISC TEST BLOOD SUGAR EVERY DAY   Facility-Administered Encounter Medications as of 08/09/2021  Medication   0.9 %  sodium chloride infusion    Allergies (verified) Hydrocodone-acetaminophen   History: Past Medical History:  Diagnosis Date   Arthritis    Cataract    right    GERD (gastroesophageal reflux disease)    Hyperlipidemia    Hypertension    Renal disorder    3 kidney stones   Sinus bradycardia seen on cardiac monitor    Urinary frequency    Past Surgical History:  Procedure Laterality Date   ABDOMINAL HYSTERECTOMY  1981   partial   COLONOSCOPY     Kidney stone removal Left 1999   KNEE ARTHROPLASTY Right 12/23/2017   Procedure: RIGHT TOTAL KNEE ARTHROPLASTY WITH COMPUTER NAVIGATION;  Surgeon: SRod Can MD;  Location: [facility];  Service: Orthopedics;  Laterality: Right;  NEEDS RNFA   KNEE SURGERY Right 2013   cleaned out damaged tissue and arthritis   LITHOTRIPSY  2003, 2006   x 2   Family History  Problem Relation Age of Onset   Aneurysm  Mother    Alzheimer's disease Father    Colon polyps Neg Hx    Colon cancer Neg Hx    Esophageal cancer Neg Hx    Stomach cancer Neg Hx    Rectal cancer Neg Hx    Social History   Socioeconomic History   Marital status: Single    Spouse name: Not on file   Number of children: Not on file   Years of education: Not on file   Highest education level: Not on file  Occupational History   Not on file  Tobacco Use   Smoking status: Every Day    Packs/day: 0.50    Types: Cigarettes   Smokeless tobacco: Never   Tobacco  comments:    down to 2 cigs a day   Vaping Use   Vaping Use: Never used  Substance and Sexual Activity   Alcohol use: No   Drug use: No   Sexual activity: Not Currently  Other Topics Concern   Not on file  Social History Narrative   Not on file   Social Determinants of Health   Financial Resource Strain: Not on file  Food Insecurity: Not on file  Transportation Needs: Not on file  Physical Activity: Not on file  Stress: Not on file  Social Connections: Not on file    Tobacco Counseling Ready to quit: Not Answered Counseling given: Not Answered Tobacco comments: down to 2 cigs a day    Clinical Intake:  Pre-visit preparation completed: Yes  Pain : No/denies pain Pain Score: 0-No pain     Diabetes: Yes CBG done?: No Did pt. bring in CBG monitor from home?: No  How often do you need to have someone help you when you read instructions, pamphlets, or other written materials from your doctor or pharmacy?: 1 - Never What is the last grade level you completed in school?: 10th grade  Diabetic?Yes  Interpreter Needed?: No      Activities of Daily Living In your present state of health, do you have any difficulty performing the following activities: 08/09/2021  Hearing? N  Vision? N  Difficulty concentrating or making decisions? N  Walking or climbing stairs? N  Dressing or bathing? N  Doing errands, shopping? N  Preparing Food and eating ? N  Using the Toilet? N  In the past six months, have you accidently leaked urine? Y  Do you have problems with loss of bowel control? N  Managing your Medications? N  Managing your Finances? N  Housekeeping or managing your Housekeeping? N  Some recent data might be hidden    Patient Care Team: Kerin Perna, NP as PCP - General (Internal Medicine)  Indicate any recent Medical Services you may have received from other than [facility] providers in the past year (date may be approximate).     Assessment:   This is a  routine wellness examination for Amber Meyer.  Hearing/Vision screen No results found.  Dietary issues and exercise activities discussed: Current Exercise Habits: Home exercise routine, Type of exercise: walking;Other - see comments (exercise bike), Time (Minutes): 20, Frequency (Times/Week): 5, Weekly Exercise (Minutes/Week): 100, Intensity: Mild, Exercise limited by: respiratory conditions(s)   Goals Addressed   None   Depression Screen PHQ 2/9 Scores 08/09/2021 07/03/2021 10/28/2020 05/01/2020 12/29/2019 09/28/2019 02/16/2018  PHQ - 2 Score 0 0 0 0 0 2 1  PHQ- 9 Score - - - - 0 10 -    Fall Risk Fall Risk  08/09/2021 07/03/2021 10/28/2020 05/01/2020  09/28/2019  Falls in the past year? 0 0 0 0 0  Number falls in past yr: 0 - - - -  Injury with Fall? 0 - - - -  Risk for fall due to : No Fall Risks - - - -  Follow up Falls evaluation completed - - - -    FALL RISK PREVENTION PERTAINING TO THE HOME:  Any stairs in or around the home? No  If so, are there any without handrails?  N/A Home free of loose throw rugs in walkways, pet beds, electrical cords, etc? Yes  Adequate lighting in your home to reduce risk of falls? Yes   ASSISTIVE DEVICES UTILIZED TO PREVENT FALLS:  Life alert? No  Use of a cane, walker or w/c? No  Grab bars in the bathroom? No  Shower chair or bench in shower? No  Elevated toilet seat or a handicapped toilet? No   TIMED UP AND GO:  Was the test performed? No .   Cognitive Function:     6CIT Screen 08/09/2021  What Year? 4 points  What month? 3 points  What time? 3 points  Count back from 20 0 points  Months in reverse 0 points  Repeat phrase 0 points  Total Score 10    Immunizations Immunization History  Administered Date(s) Administered   Influenza,inj,Quad PF,6+ Mos 09/28/2019, 10/28/2020   Influenza,inj,quad, With Preservative 11/15/2017   Influenza-Unspecified 09/13/2016   PFIZER(Purple Top)SARS-COV-2 Vaccination 01/20/2020, 02/10/2020    Pneumococcal Conjugate-13 10/28/2020   Pneumococcal Polysaccharide-23 06/02/2019   Tdap 06/02/2019    TDAP status: Up to date  Flu Vaccine status: Due, Education has been provided regarding the importance of this vaccine. Advised may receive this vaccine at local pharmacy or Health Dept. Aware to provide a copy of the vaccination record if obtained from local pharmacy or Health Dept. Verbalized acceptance and understanding.  Pneumococcal vaccine status: Up to date  Covid-19 vaccine status: Completed vaccines  Qualifies for Shingles Vaccine? Yes   Zostavax completed No   Shingrix Completed?: No.    Education has been provided regarding the importance of this vaccine. Patient has been advised to call insurance company to determine out of pocket expense if they have not yet received this vaccine. Advised may also receive vaccine at local pharmacy or Health Dept. Verbalized acceptance and understanding.  Screening Tests Health Maintenance  Topic Date Due   DEXA SCAN  Never done   COVID-19 Vaccine (3 - Booster for Pfizer series) 07/09/2020   INFLUENZA VACCINE  07/14/2021   Zoster Vaccines- Shingrix (1 of 2) 10/03/2021 (Originally 12/07/2004)   MAMMOGRAM  03/11/2023   PNA vac Low Risk Adult (2 of 2 - PPSV23) 06/01/2024   TETANUS/TDAP  06/01/2029   COLONOSCOPY (Pts 45-20yr Insurance coverage will need to be confirmed)  01/15/2031   Hepatitis C Screening  Completed   HPV VACCINES  Aged Out    Health Maintenance  Health Maintenance Due  Topic Date Due   DEXA SCAN  Never done   COVID-19 Vaccine (3 - Booster for Pfizer series) 07/09/2020   INFLUENZA VACCINE  07/14/2021    Colorectal cancer screening: Type of screening: Colonoscopy. Completed 02/04/21. Repeat every 1 years  Mammogram status: Completed 03/10/21. Repeat every year  Bone Density status: Ordered 08/28/21. Pt provided with contact info and advised to call to schedule appt.  Lung Cancer Screening: (Low Dose CT Chest  recommended if Age 67-80years, 30 pack-year currently smoking OR have quit w/in 15years.) does qualify.  Lung Cancer Screening Referral: No  Additional Screening:  Hepatitis C Screening: does not qualify; Completed 10/02/2019.  Vision Screening: Recommended annual ophthalmology exams for early detection of glaucoma and other disorders of the eye. Is the patient up to date with their annual eye exam?  No  Who is the provider or what is the name of the office in which the patient attends annual eye exams? N/A If pt is not established with a provider, would they like to be referred to a provider to establish care?  Patient will schedule once debt with eye doctor office is paid. .   Dental Screening: Recommended annual dental exams for proper oral hygiene  Community Resource Referral / Chronic Care Management: CRR required this visit?  No   CCM required this visit?  No      Plan:     I have personally reviewed and noted the following in the patient's chart:   Medical and social history Use of alcohol, tobacco or illicit drugs  Current medications and supplements including opioid prescriptions. Patient is not currently taking opioid prescriptions. Functional ability and status Nutritional status Physical activity Advanced directives List of other physicians Hospitalizations, surgeries, and ER visits in previous 12 months Vitals Screenings to include cognitive, depression, and falls Referrals and appointments  In addition, I have reviewed and discussed with patient certain preventive protocols, quality metrics, and best practice recommendations. A written personalized care plan for preventive services as well as general preventive health recommendations were provided to patient.     Thressa Sheller, [place]   08/09/2021   Nurse Notes: 20 minutes. Non face to face.   Ms. Hundal , Thank you for taking time to come for your Medicare Wellness Visit. I appreciate your ongoing  commitment to your health goals. Please review the following plan we discussed and let me know if I can assist you in the future.   These are the goals we discussed:  Goals   None     This is a list of the screening recommended for you and due dates:  Health Maintenance  Topic Date Due   DEXA scan (bone density measurement)  Never done   Flu Shot  07/14/2021   Zoster (Shingles) Vaccine (1 of 2) 10/03/2021*   Mammogram  03/11/2023   Pneumonia vaccines (2 of 2 - PPSV23) 06/01/2024   Tetanus Vaccine  06/01/2029   Colon Cancer Screening  01/15/2031   COVID-19 Vaccine  Completed   Hepatitis C Screening: USPSTF Recommendation to screen - Ages 18-79 yo.  Completed   HPV Vaccine  Aged Out  *Topic was postponed. The date shown is not the original due date.

## 2021-08-28 ENCOUNTER — Other Ambulatory Visit: Payer: Self-pay

## 2021-08-28 ENCOUNTER — Ambulatory Visit
Admission: RE | Admit: 2021-08-28 | Discharge: 2021-08-28 | Disposition: A | Discharge status: Home / Self Care | Payer: Medicare HMO | Source: Ambulatory Visit | Attending: Primary Care | Admitting: Primary Care

## 2021-08-28 DIAGNOSIS — Z13820 Encounter for screening for osteoporosis: Secondary | ICD-10-CM

## 2021-08-28 DIAGNOSIS — Z780 Asymptomatic menopausal state: Secondary | ICD-10-CM | POA: Diagnosis not present

## 2021-09-03 ENCOUNTER — Other Ambulatory Visit (INDEPENDENT_AMBULATORY_CARE_PROVIDER_SITE_OTHER): Payer: Self-pay | Admitting: Primary Care

## 2021-09-03 DIAGNOSIS — E119 Type 2 diabetes mellitus without complications: Secondary | ICD-10-CM

## 2021-09-03 NOTE — Telephone Encounter (Signed)
Sent to PCP ?

## 2021-09-12 ENCOUNTER — Ambulatory Visit: Payer: Medicare HMO

## 2021-10-06 ENCOUNTER — Other Ambulatory Visit: Payer: Self-pay

## 2021-10-06 ENCOUNTER — Ambulatory Visit (INDEPENDENT_AMBULATORY_CARE_PROVIDER_SITE_OTHER): Payer: Medicare HMO | Admitting: Primary Care

## 2021-10-06 ENCOUNTER — Encounter (INDEPENDENT_AMBULATORY_CARE_PROVIDER_SITE_OTHER): Payer: Self-pay | Admitting: Primary Care

## 2021-10-06 VITALS — BP 128/76 | HR 90 | Temp 97.3°F | Ht 67.0 in | Wt 235.0 lb

## 2021-10-06 DIAGNOSIS — I10 Essential (primary) hypertension: Secondary | ICD-10-CM

## 2021-10-06 DIAGNOSIS — G4700 Insomnia, unspecified: Secondary | ICD-10-CM

## 2021-10-06 DIAGNOSIS — Z760 Encounter for issue of repeat prescription: Secondary | ICD-10-CM

## 2021-10-06 DIAGNOSIS — E119 Type 2 diabetes mellitus without complications: Secondary | ICD-10-CM

## 2021-10-06 DIAGNOSIS — Z23 Encounter for immunization: Secondary | ICD-10-CM

## 2021-10-06 DIAGNOSIS — F17210 Nicotine dependence, cigarettes, uncomplicated: Secondary | ICD-10-CM

## 2021-10-06 DIAGNOSIS — F17200 Nicotine dependence, unspecified, uncomplicated: Secondary | ICD-10-CM

## 2021-10-06 DIAGNOSIS — N3281 Overactive bladder: Secondary | ICD-10-CM

## 2021-10-06 LAB — POCT GLYCOSYLATED HEMOGLOBIN (HGB A1C): Hemoglobin A1C: 6 % — AB (ref 4.0–5.6)

## 2021-10-06 MED ORDER — LOSARTAN POTASSIUM-HCTZ 100-25 MG PO TABS: 1.0000 | ORAL_TABLET | Freq: Every day | ORAL | 1 refills | Status: DC

## 2021-10-06 MED ORDER — AMITRIPTYLINE HCL 50 MG PO TABS: 50.0000 mg | ORAL_TABLET | Freq: Every day | ORAL | 1 refills | Status: DC

## 2021-10-06 MED ORDER — MIRABEGRON ER 25 MG PO TB24: 25.0000 mg | ORAL_TABLET | Freq: Every day | ORAL | 1 refills | Status: DC

## 2021-10-06 MED ORDER — AMLODIPINE BESYLATE 10 MG PO TABS: ORAL_TABLET | ORAL | 1 refills | Status: DC

## 2021-10-06 MED ORDER — OXYBUTYNIN CHLORIDE ER 10 MG PO TB24: 10.0000 mg | ORAL_TABLET | Freq: Every day | ORAL | 1 refills | Status: DC

## 2021-10-06 MED ORDER — JANUMET 50-1000 MG PO TABS: 1.0000 | ORAL_TABLET | Freq: Two times a day (BID) | ORAL | 1 refills | Status: DC

## 2021-10-06 MED ORDER — METOPROLOL TARTRATE 25 MG PO TABS: 25.0000 mg | ORAL_TABLET | Freq: Two times a day (BID) | ORAL | 1 refills | Status: DC

## 2021-10-06 MED ORDER — ATORVASTATIN CALCIUM 20 MG PO TABS: ORAL_TABLET | ORAL | 1 refills | Status: DC

## 2021-10-06 NOTE — Patient Instructions (Signed)
Influenza, Adult °Influenza, also called "the flu," is a viral infection that mainly affects the respiratory tract. This includes the lungs, nose, and throat. The flu spreads easily from person to person (is contagious). It causes common cold symptoms, along with high fever and body aches. °What are the causes? °This condition is caused by the influenza virus. You can get the virus by: °Breathing in droplets that are in the air from an infected person's cough or sneeze. °Touching something that has the virus on it (has been contaminated) and then touching your mouth, nose, or eyes. °What increases the risk? °The following factors may make you more likely to get the flu: °Not washing or sanitizing your hands often. °Having close contact with many people during cold and flu season. °Touching your mouth, eyes, or nose without first washing or sanitizing your hands. °Not getting an annual flu shot. °You may have a higher risk for the flu, including serious problems, such as a lung infection (pneumonia), if you: °Are older than 65. °Are pregnant. °Have a weakened disease-fighting system (immune system). This includes people who have HIV or AIDS, are on chemotherapy, or are taking medicines that reduce (suppress) the immune system. °Have a long-term (chronic) illness, such as heart disease, kidney disease, diabetes, or lung disease. °Have a liver disorder. °Are severely overweight (morbidly obese). °Have anemia. °Have asthma. °What are the signs or symptoms? °Symptoms of this condition usually begin suddenly and last 4-14 days. These may include: °Fever and chills. °Headaches, body aches, or muscle aches. °Sore throat. °Cough. °Runny or stuffy (congested) nose. °Chest discomfort. °Poor appetite. °Weakness or fatigue. °Dizziness. °Nausea or vomiting. °How is this diagnosed? °This condition may be diagnosed based on: °Your symptoms and medical history. °A physical exam. °Swabbing your nose or throat and testing the fluid  for the influenza virus. °How is this treated? °If the flu is diagnosed early, you can be treated with antiviral medicine that is given by mouth (orally) or through an IV. This can help reduce how severe the illness is and how long it lasts. °Taking care of yourself at home can help relieve symptoms. Your health care provider may recommend: °Taking over-the-counter medicines. °Drinking plenty of fluids. °In many cases, the flu goes away on its own. If you have severe symptoms or complications, you may be treated in a hospital. °Follow these instructions at home: °Activity °Rest as needed and get plenty of sleep. °Stay home from work or school as told by your health care provider. Unless you are visiting your health care provider, avoid leaving home until your fever has been gone for 24 hours without taking medicine. °Eating and drinking °Take an oral rehydration solution (ORS). This is a drink that is sold at pharmacies and retail stores. °Drink enough fluid to keep your urine pale yellow. °Drink clear fluids in small amounts as you are able. Clear fluids include water, ice chips, fruit juice mixed with water, and low-calorie sports drinks. °Eat bland, easy-to-digest foods in small amounts as you are able. These foods include bananas, applesauce, rice, lean meats, toast, and crackers. °Avoid drinking fluids that contain a lot of sugar or caffeine, such as energy drinks, regular sports drinks, and soda. °Avoid alcohol. °Avoid spicy or fatty foods. °General instructions °  °Take over-the-counter and prescription medicines only as told by your health care provider. °Use a cool mist humidifier to add humidity to the air in your home. This can make it easier to breathe. °When using a cool mist humidifier,   clean it daily. Empty the water and replace it with clean water. °Cover your mouth and nose when you cough or sneeze. °Wash your hands with soap and water often and for at least 20 seconds, especially after you cough or  sneeze. If soap and water are not available, use alcohol-based hand sanitizer. °Keep all follow-up visits. This is important. °How is this prevented? ° °Get an annual flu shot. This is usually available in late summer, fall, or winter. Ask your health care provider when you should get your flu shot. °Avoid contact with people who are sick during cold and flu season. This is generally fall and winter. °Contact a health care provider if: °You develop new symptoms. °You have: °Chest pain. °Diarrhea. °A fever. °Your cough gets worse. °You produce more mucus. °You feel nauseous or you vomit. °Get help right away if you: °Develop shortness of breath or have difficulty breathing. °Have skin or nails that turn a bluish color. °Have severe pain or stiffness in your neck. °Develop a sudden headache or sudden pain in your face or ear. °Cannot eat or drink without vomiting. °These symptoms may represent a serious problem that is an emergency. Do not wait to see if the symptoms will go away. Get medical help right away. Call your local emergency services (911 in the [place] Do not drive yourself to the hospital. °Summary °Influenza, also called "the flu," is a viral infection that primarily affects your respiratory tract. °Symptoms of the flu usually begin suddenly and last 4-14 days. °Getting an annual flu shot is the best way to prevent getting the flu. °Stay home from work or school as told by your health care provider. Unless you are visiting your health care provider, avoid leaving home until your fever has been gone for 24 hours without taking medicine. °Keep all follow-up visits. This is important. °This information is not intended to replace advice given to you by your health care provider. Make sure you discuss any questions you have with your health care provider. °Document Revised: 07/19/2020 Document Reviewed: 07/19/2020 °[employer]vier Patient Education © 2022 [employer] ° °

## 2021-10-06 NOTE — Progress Notes (Signed)
[facility]  Established Patient Office Visit  Subjective:  Patient ID: Amber Meyer, female    DOB: 03-Nov-1956  Age: 65 y.o. MRN: [id number]  CC:  Chief Complaint  Patient presents with   Diabetes    HPI Amber Meyer is a 65 year old female presents for the management of chronic disease type 2 diabetes-she denies polyuria, polydipsia, polyphagia or vision changes.  Hypertension is controlled amlodipine 10 and Hyzaar 100/25 both taken daily.  She denies shortness of breath, headaches, chest pain or lower extremity edema.  She does have a complaint and concern oxybutynin is no longer effective not helping urinary incontinence at all.  Patient would like to try a different medication for urinary incontinence.   Past Medical History:  Diagnosis Date   Arthritis    Cataract    right    GERD (gastroesophageal reflux disease)    Hyperlipidemia    Hypertension    Renal disorder    3 kidney stones   Sinus bradycardia seen on cardiac monitor    Urinary frequency     Past Surgical History:  Procedure Laterality Date   ABDOMINAL HYSTERECTOMY  1981   partial   COLONOSCOPY     Kidney stone removal Left 1999   KNEE ARTHROPLASTY Right 12/23/2017   Procedure: RIGHT TOTAL KNEE ARTHROPLASTY WITH COMPUTER NAVIGATION;  Surgeon: Rod Can, MD;  Location: [facility];  Service: Orthopedics;  Laterality: Right;  NEEDS RNFA   KNEE SURGERY Right 2013   cleaned out damaged tissue and arthritis   LITHOTRIPSY  2003, 2006   x 2    Family History  Problem Relation Age of Onset   Aneurysm Mother    Alzheimer's disease Father    Colon polyps Neg Hx    Colon cancer Neg Hx    Esophageal cancer Neg Hx    Stomach cancer Neg Hx    Rectal cancer Neg Hx     Social History   Socioeconomic History   Marital status: Single    Spouse name: Not on file   Number of children: Not on file   Years of education: Not on file   Highest education level: Not on file   Occupational History   Not on file  Tobacco Use   Smoking status: Every Day    Packs/day: 0.50    Types: Cigarettes   Smokeless tobacco: Never   Tobacco comments:    down to 2 cigs a day   Vaping Use   Vaping Use: Never used  Substance and Sexual Activity   Alcohol use: No   Drug use: No   Sexual activity: Not Currently  Other Topics Concern   Not on file  Social History Narrative   Not on file   Social Determinants of Health   Financial Resource Strain: Not on file  Food Insecurity: Not on file  Transportation Needs: Not on file  Physical Activity: Not on file  Stress: Not on file  Social Connections: Not on file  Intimate Partner Violence: Not on file    Outpatient Medications Prior to Visit  Medication Sig Dispense Refill   albuterol (VENTOLIN HFA) 108 (90 Base) MCG/ACT inhaler INHALE 2 PUFFS INTO THE LUNGS EVERY 4 HOURS AS NEEDED FOR WHEEZING 1 each 2   amitriptyline (ELAVIL) 50 MG tablet Take 1 tablet (50 mg total) by mouth at bedtime. 90 tablet 1   Blood Glucose Monitoring Suppl (TRUE METRIX METER) w/Device KIT Use as instructed. 1 kit 0  esomeprazole (NEXIUM) 40 MG capsule TAKE 1 CAPSULE EVERY DAY AT NOON 90 capsule 0   Multiple Vitamins-Calcium (ONE-A-DAY WOMENS PO) Take 1 tablet by mouth daily.     TRUE METRIX BLOOD GLUCOSE TEST test strip TEST BLOOD SUGAR EVERY DAY 100 strip 1   TRUEplus Lancets 30G MISC TEST BLOOD SUGAR EVERY DAY 100 each 11   amLODipine (NORVASC) 10 MG tablet TAKE 1 TABLET EVERY DAY (NEED MD APPOINTMENT) 90 tablet 1   atorvastatin (LIPITOR) 20 MG tablet TAKE 1 TABLET EVERY DAY 90 tablet 1   JANUMET 50-1000 MG tablet TAKE 1 TABLET TWICE DAILY WITH MEALS 180 tablet 0   losartan-hydrochlorothiazide (HYZAAR) 100-25 MG tablet Take 1 tablet by mouth daily. 90 tablet 1   metoprolol tartrate (LOPRESSOR) 25 MG tablet Take 1 tablet (25 mg total) by mouth 2 (two) times daily. 180 tablet 1   oxybutynin (DITROPAN-XL) 10 MG 24 hr tablet Take 1 tablet (10  mg total) by mouth at bedtime. 90 tablet 1   Facility-Administered Medications Prior to Visit  Medication Dose Route Frequency Provider Last Rate Last Admin   0.9 %  sodium chloride infusion  500 mL Intravenous Once Cirigliano, Vito V, DO        Allergies  Allergen Reactions   Hydrocodone-Acetaminophen Hives and Other (See Comments)    Can take with Benadryl     ROS Comprehensive review of systems completed pertinent positives and negatives noted in HPI   Objective:   BP 128/76 (BP Location: Right Arm, Patient Position: Sitting, Cuff Size: Large)   Pulse 90   Temp (!) 97.3 F (36.3 C) (Temporal)   Ht 5' 7"  (1.702 m)   Wt 235 lb (106.6 kg)   SpO2 96%   BMI 36.81 kg/m    Physical exam: General: Vital signs reviewed.  Patient is well-developed and well-nourished, xx in no acute distress and cooperative with exam. Head: Normocephalic and atraumatic. Eyes: EOMI, conjunctivae normal, no scleral icterus. Neck: Supple, trachea midline, normal ROM, no JVD, masses, thyromegaly, or carotid bruit present. Cardiovascular: RRR, S1 normal, S2 normal, no murmurs, gallops, or rubs. Pulmonary/Chest: Clear to auscultation bilaterally, no wheezes, rales, or rhonchi. Abdominal: Soft, non-tender, non-distended, BS +, no masses, organomegaly, or guarding present. Musculoskeletal: No joint deformities, erythema, or stiffness, ROM full and nontender. Extremities: No lower extremity edema bilaterally,  pulses symmetric and intact bilaterally. No cyanosis or clubbing. Neurological: A&O x3, Strength is normal Skin: Warm, dry and intact. No rashes or erythema. Psychiatric: Normal mood and affect. speech and behavior is normal. Cognition and memory are normal.    BP 128/76 (BP Location: Right Arm, Patient Position: Sitting, Cuff Size: Large)   Pulse 90   Temp (!) 97.3 F (36.3 C) (Temporal)   Ht 5' 7"  (1.702 m)   Wt 235 lb (106.6 kg)   SpO2 96%   BMI 36.81 kg/m  Wt Readings from Last 3  Encounters:  10/06/21 235 lb (106.6 kg)  07/03/21 237 lb 3.2 oz (107.6 kg)  01/15/21 223 lb (101.2 kg)     Health Maintenance Due  Topic Date Due   Zoster Vaccines- Shingrix (1 of 2) Never done   INFLUENZA VACCINE  07/14/2021    There are no preventive care reminders to display for this patient.  Lab Results  Component Value Date   TSH 1.160 07/03/2021   Lab Results  Component Value Date   WBC 9.0 07/03/2021   HGB 13.3 07/03/2021   HCT 40.0 07/03/2021   MCV 87  07/03/2021   PLT 379 07/03/2021   Lab Results  Component Value Date   NA 139 07/03/2021   K 3.9 07/03/2021   CO2 25 07/03/2021   GLUCOSE 131 (H) 07/03/2021   BUN 15 07/03/2021   CREATININE 1.01 (H) 07/03/2021   BILITOT 0.3 07/03/2021   ALKPHOS 166 (H) 07/03/2021   AST 16 07/03/2021   ALT 19 07/03/2021   PROT 7.1 07/03/2021   ALBUMIN 4.6 07/03/2021   CALCIUM 10.6 (H) 07/03/2021   ANIONGAP 14 09/22/2019   EGFR 61 07/03/2021   Lab Results  Component Value Date   CHOL 117 07/03/2021   Lab Results  Component Value Date   HDL 44 07/03/2021   Lab Results  Component Value Date   LDLCALC 43 07/03/2021   Lab Results  Component Value Date   TRIG 186 (H) 07/03/2021   Lab Results  Component Value Date   CHOLHDL 2.7 07/03/2021   Lab Results  Component Value Date   HGBA1C 7.3 (A) 07/03/2021      Assessment & Plan:  [name] was seen today for diabetes. Diagnoses and all orders for this visit: [name] was seen today for diabetes.  Diagnoses and all orders for this visit:  Medication refill -     Discontinue: amLODipine (NORVASC) 10 MG tablet; TAKE 1 TABLET EVERY DAY (NEED MD APPOINTMENT) -     atorvastatin (LIPITOR) 20 MG tablet; TAKE 1 TABLET EVERY DAY -     losartan-hydrochlorothiazide (HYZAAR) 100-25 MG tablet; Take 1 tablet by mouth daily. -     metoprolol tartrate (LOPRESSOR) 25 MG tablet; Take 1 tablet (25 mg total) by mouth 2 (two) times daily. -     amitriptyline (ELAVIL) 50 MG tablet;  Take 1 tablet (50 mg total) by mouth at bedtime. -     amLODipine (NORVASC) 10 MG tablet; TAKE 1 TABLET EVERY DAY (NEED MD APPOINTMENT)  Type 2 diabetes mellitus without complication, without long-term current use of insulin (HCC) -     Lipid Panel -     CBC with Differential -     sitaGLIPtin-metformin (JANUMET) 50-1000 MG tablet; Take 1 tablet by mouth 2 (two) times daily with a meal. -     HgB A1c 6.3 down from 7.3 months ago  Essential hypertension Blood pressure is at goal of less than 130/80, low-sodium, DASH diet, medication compliance, 150 minutes of moderate intensity exercise per week. Discussed medication compliance, adverse effects.  -     CMP14+EGFR -     Discontinue: amLODipine (NORVASC) 10 MG tablet; TAKE 1 TABLET EVERY DAY (NEED MD APPOINTMENT) -     losartan-hydrochlorothiazide (HYZAAR) 100-25 MG tablet; Take 1 tablet by mouth daily. -     metoprolol tartrate (LOPRESSOR) 25 MG tablet; Take 1 tablet (25 mg total) by mouth 2 (two) times daily. -     amLODipine (NORVASC) 10 MG tablet; TAKE 1 TABLET EVERY DAY (NEED MD APPOINTMENT)  Need for immunization against influenza -     Flu Vaccine QUAD 22moIM (Fluarix, Fluzone & Alfiuria Quad PF)  Tobacco dependence  Overactive bladder -     Discontinue: oxybutynin (DITROPAN-XL) 10 MG 24 hr tablet; Take 1 tablet (10 mg total) by mouth at bedtime. -     mirabegron ER (MYRBETRIQ) 25 MG TB24 tablet; Take 1 tablet (25 mg total) by mouth daily.  Need for shingles vaccine -     Varicella-zoster vaccine IM (Shingrix)  Insomnia, unspecified type -     amitriptyline (ELAVIL) 50 MG tablet;  Take 1 tablet (50 mg total) by mouth at bedtime.         Follow-up: No follow-ups on file.    Kerin Perna, NP

## 2021-10-07 LAB — CMP14+EGFR
ALT: 12 IU/L (ref 0–32)
AST: 13 IU/L (ref 0–40)
Albumin/Globulin Ratio: 1.6 (ref 1.2–2.2)
Albumin: 4.4 g/dL (ref 3.8–4.8)
Alkaline Phosphatase: 239 IU/L — ABNORMAL HIGH (ref 44–121)
BUN/Creatinine Ratio: 13 (ref 12–28)
BUN: 12 mg/dL (ref 8–27)
Bilirubin Total: 0.3 mg/dL (ref 0.0–1.2)
CO2: 24 mmol/L (ref 20–29)
Calcium: 10.9 mg/dL — ABNORMAL HIGH (ref 8.7–10.3)
Chloride: 100 mmol/L (ref 96–106)
Creatinine, Ser: 0.89 mg/dL (ref 0.57–1.00)
Globulin, Total: 2.7 g/dL (ref 1.5–4.5)
Glucose: 114 mg/dL — ABNORMAL HIGH (ref 70–99)
Potassium: 4.1 mmol/L (ref 3.5–5.2)
Sodium: 141 mmol/L (ref 134–144)
Total Protein: 7.1 g/dL (ref 6.0–8.5)
eGFR: 71 mL/min/{1.73_m2} (ref >59)

## 2021-10-07 LAB — CBC WITH DIFFERENTIAL/PLATELET
Basophils Absolute: 0 10*3/uL (ref 0.0–0.2)
Basos: 0 %
EOS (ABSOLUTE): 1.5 10*3/uL — ABNORMAL HIGH (ref 0.0–0.4)
Eos: 13 %
Hematocrit: 39.6 % (ref 34.0–46.6)
Hemoglobin: 13.3 g/dL (ref 11.1–15.9)
Immature Grans (Abs): 0 10*3/uL (ref 0.0–0.1)
Immature Granulocytes: 0 %
Lymphocytes Absolute: 3.5 10*3/uL — ABNORMAL HIGH (ref 0.7–3.1)
Lymphs: 29 %
MCH: 29.1 pg (ref 26.6–33.0)
MCHC: 33.6 g/dL (ref 31.5–35.7)
MCV: 87 fL (ref 79–97)
Monocytes Absolute: 0.5 10*3/uL (ref 0.1–0.9)
Monocytes: 4 %
Neutrophils Absolute: 6.4 10*3/uL (ref 1.4–7.0)
Neutrophils: 54 %
Platelets: 407 10*3/uL (ref 150–450)
RBC: 4.57 x10E6/uL (ref 3.77–5.28)
RDW: 13.7 % (ref 11.7–15.4)
WBC: 12 10*3/uL — ABNORMAL HIGH (ref 3.4–10.8)

## 2021-10-07 LAB — LIPID PANEL
Chol/HDL Ratio: 2.7 ratio (ref 0.0–4.4)
Cholesterol, Total: 122 mg/dL (ref 100–199)
HDL: 45 mg/dL (ref >39)
LDL Chol Calc (NIH): 54 mg/dL (ref 0–99)
Triglycerides: 130 mg/dL (ref 0–149)
VLDL Cholesterol Cal: 23 mg/dL (ref 5–40)

## 2021-10-20 ENCOUNTER — Other Ambulatory Visit (INDEPENDENT_AMBULATORY_CARE_PROVIDER_SITE_OTHER): Payer: Self-pay | Admitting: Nurse Practitioner

## 2021-10-23 ENCOUNTER — Other Ambulatory Visit: Payer: Self-pay

## 2021-10-23 MED ORDER — ESOMEPRAZOLE MAGNESIUM 40 MG PO CPDR: DELAYED_RELEASE_CAPSULE | ORAL | 0 refills | Status: DC

## 2021-10-23 NOTE — Telephone Encounter (Signed)
Pt. Reports her mail order pharmacy did not receive Nexium refilled per phone note 10/20/21. Attempted to resend prescription, but states it "printed". Please refill Nexium for pt.

## 2021-10-23 NOTE — Progress Notes (Signed)
Attempted to refill Nexium, reports as "printed".

## 2021-11-20 ENCOUNTER — Other Ambulatory Visit: Payer: Self-pay | Admitting: Family Medicine

## 2021-11-20 NOTE — Telephone Encounter (Signed)
Requested Prescriptions  Pending Prescriptions Disp Refills  . esomeprazole (NEXIUM) 40 MG capsule [Pharmacy Med Name: ESOMEPRAZOLE MAGNESIUM 40MG  DR CAPS] 90 capsule 0    Sig: TAKE ONE CAPSULE BY MOUTH AT [place]     Gastroenterology: Proton Pump Inhibitors Passed - 11/20/2021  7:02 AM      Passed - Valid encounter within last 12 months    Recent Outpatient Visits          1 month ago Medication refill   [place] Juluis Mire P, NP   4 months ago Type 2 diabetes mellitus without complication, without long-term current use of insulin ([place])   [place] Juluis Mire P, NP   1 year ago Type 2 diabetes mellitus without complication, without long-term current use of insulin ([place])   [place] [facility] Kerin Perna, NP   1 year ago Overactive bladder   [place] Kerin Perna, NP   1 year ago Type 2 diabetes mellitus without complication, without long-term current use of insulin ([place])   [facility] [place] [facility] Kerin Perna, NP      Future Appointments            In 2 months Oletta Lamas, Milford Cage, NP [place]

## 2021-12-22 ENCOUNTER — Telehealth (INDEPENDENT_AMBULATORY_CARE_PROVIDER_SITE_OTHER): Payer: Self-pay

## 2021-12-22 NOTE — Telephone Encounter (Signed)
Copied from [place] [phone]. Topic: Referral - Request for Referral >> Dec 22, 2021 11:15 AM Erick Blinks wrote: Has patient seen PCP for this complaint? Yes *If NO, is insurance requiring patient see PCP for this issue before PCP can refer them? Referral for which specialty: GI Preferred provider/office: [facility]  Reason for referral: Needs colonoscopy

## 2021-12-23 ENCOUNTER — Other Ambulatory Visit (INDEPENDENT_AMBULATORY_CARE_PROVIDER_SITE_OTHER): Payer: Self-pay | Admitting: Primary Care

## 2021-12-23 DIAGNOSIS — Z1211 Encounter for screening for malignant neoplasm of colon: Secondary | ICD-10-CM

## 2022-01-12 ENCOUNTER — Telehealth (INDEPENDENT_AMBULATORY_CARE_PROVIDER_SITE_OTHER): Payer: Self-pay | Admitting: Primary Care

## 2022-01-12 ENCOUNTER — Other Ambulatory Visit (INDEPENDENT_AMBULATORY_CARE_PROVIDER_SITE_OTHER): Payer: Self-pay

## 2022-01-12 MED ORDER — ESOMEPRAZOLE MAGNESIUM 40 MG PO CPDR: DELAYED_RELEASE_CAPSULE | ORAL | 0 refills | Status: DC

## 2022-01-12 NOTE — Telephone Encounter (Signed)
Pt is completely out of her current supply of esomeprazole ([place]) 40 MG capsule  [facility] - [place], [address]  [place] [address]  Phone: [phone] Fax: [phone]   Pt called to report that a fax is on the way now for this refill

## 2022-01-12 NOTE — Telephone Encounter (Signed)
Refill has been sent to patient pharmacy of choice.

## 2022-01-13 ENCOUNTER — Other Ambulatory Visit (INDEPENDENT_AMBULATORY_CARE_PROVIDER_SITE_OTHER): Payer: Self-pay | Admitting: Primary Care

## 2022-01-13 DIAGNOSIS — Z760 Encounter for issue of repeat prescription: Secondary | ICD-10-CM

## 2022-01-23 ENCOUNTER — Other Ambulatory Visit: Payer: Self-pay | Admitting: Family Medicine

## 2022-01-23 NOTE — Telephone Encounter (Signed)
Requested Prescriptions  Pending Prescriptions Disp Refills   TRUE METRIX BLOOD GLUCOSE TEST test strip [Pharmacy Med Name: TRUE METRIX SELF MONITORING BLOOD GLUCOSE STRIPS   Strip] 100 strip 1    Sig: TEST BLOOD SUGAR EVERY DAY     Endocrinology: Diabetes - Testing Supplies Passed - 01/23/2022  8:20 PM      Passed - Valid encounter within last 12 months    Recent Outpatient Visits          3 months ago Medication refill   [place], Michelle P, NP   6 months ago Type 2 diabetes mellitus without complication, without long-term current use of insulin ([place])   [place] [facility] Juluis Mire P, NP   1 year ago Type 2 diabetes mellitus without complication, without long-term current use of insulin ([place])   [place] [facility] Kerin Perna, NP   1 year ago Overactive bladder   [place] Kerin Perna, NP   2 years ago Type 2 diabetes mellitus without complication, without long-term current use of insulin ([place])   [place], [place], NP      Future Appointments            In 2 weeks Oletta Lamas, Milford Cage, NP [place]

## 2022-02-04 ENCOUNTER — Encounter: Payer: Self-pay | Admitting: Gastroenterology

## 2022-02-06 ENCOUNTER — Ambulatory Visit (INDEPENDENT_AMBULATORY_CARE_PROVIDER_SITE_OTHER): Payer: Medicare HMO | Admitting: Primary Care

## 2022-02-12 ENCOUNTER — Ambulatory Visit (AMBULATORY_SURGERY_CENTER): Payer: Medicare HMO | Admitting: *Deleted

## 2022-02-12 ENCOUNTER — Other Ambulatory Visit: Payer: Self-pay

## 2022-02-12 VITALS — Ht 67.0 in | Wt 236.0 lb

## 2022-02-12 DIAGNOSIS — Z1211 Encounter for screening for malignant neoplasm of colon: Secondary | ICD-10-CM

## 2022-02-12 MED ORDER — NA SULFATE-K SULFATE-MG SULF 17.5-3.13-1.6 GM/177ML PO SOLN: 1.0000 | Freq: Once | ORAL | 0 refills | Status: AC

## 2022-02-12 NOTE — Progress Notes (Signed)
No egg or soy allergy known to patient  ?No issues known to pt with past sedation with any surgeries or procedures ?Patient denies ever being told they had issues or difficulty with intubation  ?No FH of Malignant Hyperthermia ?Pt is not on diet pills ?Pt is not on  home 02  ?Pt is not on blood thinners  ?Pt denies issues with constipation  ?No A fib or A flutter ? ?Pt is  vaccinated  for Covid  ? ?Due to the COVID-19 pandemic we are asking patients to follow certain guidelines in PV and the [place]   ?Pt aware of COVID protocols and [facility] guidelines  ? ?PV completed over the phone. Pt verified name, DOB, address and insurance during PV today.  ?Pt mailed instruction packet with copy of consent form to read and not return, and instructions.  ?Pt encouraged to call with questions or issues.  ?If pt has My chart, procedure instructions sent via My Chart   ? ?Sample  sheet of  over the counter items to purchase for prep sent with packet. ? ?Colonoscopy classification sheet sent with packet. ?

## 2022-02-19 ENCOUNTER — Other Ambulatory Visit: Payer: Self-pay

## 2022-02-19 ENCOUNTER — Ambulatory Visit (INDEPENDENT_AMBULATORY_CARE_PROVIDER_SITE_OTHER): Payer: Medicare HMO | Admitting: Primary Care

## 2022-02-19 ENCOUNTER — Encounter (INDEPENDENT_AMBULATORY_CARE_PROVIDER_SITE_OTHER): Payer: Self-pay | Admitting: Primary Care

## 2022-02-19 VITALS — BP 130/80 | HR 112 | Temp 98.2°F | Ht 67.0 in | Wt 233.4 lb

## 2022-02-19 DIAGNOSIS — N3281 Overactive bladder: Secondary | ICD-10-CM

## 2022-02-19 DIAGNOSIS — Z760 Encounter for issue of repeat prescription: Secondary | ICD-10-CM

## 2022-02-19 DIAGNOSIS — G4700 Insomnia, unspecified: Secondary | ICD-10-CM | POA: Diagnosis not present

## 2022-02-19 DIAGNOSIS — J9801 Acute bronchospasm: Secondary | ICD-10-CM

## 2022-02-19 DIAGNOSIS — R109 Unspecified abdominal pain: Secondary | ICD-10-CM

## 2022-02-19 DIAGNOSIS — E119 Type 2 diabetes mellitus without complications: Secondary | ICD-10-CM

## 2022-02-19 DIAGNOSIS — I10 Essential (primary) hypertension: Secondary | ICD-10-CM | POA: Diagnosis not present

## 2022-02-19 LAB — POCT GLYCOSYLATED HEMOGLOBIN (HGB A1C): Hemoglobin A1C: 6.3 % — AB (ref 4.0–5.6)

## 2022-02-19 MED ORDER — METOPROLOL TARTRATE 25 MG PO TABS: 25.0000 mg | ORAL_TABLET | Freq: Two times a day (BID) | ORAL | 1 refills | Status: DC

## 2022-02-19 MED ORDER — MIRABEGRON ER 25 MG PO TB24: 25.0000 mg | ORAL_TABLET | Freq: Every day | ORAL | 1 refills | Status: DC

## 2022-02-19 MED ORDER — LOSARTAN POTASSIUM-HCTZ 100-25 MG PO TABS: 1.0000 | ORAL_TABLET | Freq: Every day | ORAL | 1 refills | Status: DC

## 2022-02-19 MED ORDER — JANUMET 50-1000 MG PO TABS: 1.0000 | ORAL_TABLET | Freq: Two times a day (BID) | ORAL | 1 refills | Status: DC

## 2022-02-19 MED ORDER — AMLODIPINE BESYLATE 10 MG PO TABS: ORAL_TABLET | ORAL | 1 refills | Status: DC

## 2022-02-19 MED ORDER — AMITRIPTYLINE HCL 50 MG PO TABS: 50.0000 mg | ORAL_TABLET | Freq: Every day | ORAL | 1 refills | Status: DC

## 2022-02-19 MED ORDER — ALBUTEROL SULFATE HFA 108 (90 BASE) MCG/ACT IN AERS: INHALATION_SPRAY | RESPIRATORY_TRACT | 2 refills | Status: DC

## 2022-02-19 NOTE — Progress Notes (Signed)
[place]   [name] is a 66 y.o. female presents for hypertension evaluation, Denies shortness of breath, headaches, chest pain or lower extremity edema, sudden onset, vision changes, unilateral weakness, dizziness, paresthesias   Patient reports adherence with medications.  Dietary habits include: sodium and carb  Exercise habits include:yes Family / Social history: sister 2013 (MI) [phone] systolic  49-*7 Past Medical History:  Diagnosis Date   Allergy    seasonal   Arthritis    Cataract    right    GERD (gastroesophageal reflux disease)    Hyperlipidemia    Hypertension    MRSA infection    2019   Renal disorder    3 kidney stones   Sinus bradycardia seen on cardiac monitor    Urgency of urination    Urinary frequency    Past Surgical History:  Procedure Laterality Date   ABDOMINAL HYSTERECTOMY  1981   partial   COLONOSCOPY     Kidney stone removal Left 1999   KNEE ARTHROPLASTY Right 12/23/2017   Procedure: RIGHT TOTAL KNEE ARTHROPLASTY WITH COMPUTER NAVIGATION;  Surgeon: Rod Can, MD;  Location: [facility];  Service: Orthopedics;  Laterality: Right;  NEEDS RNFA   KNEE SURGERY Right 2013   cleaned out damaged tissue and arthritis   LITHOTRIPSY  2003, 2006   x 2   Allergies  Allergen Reactions   Hydrocodone-Acetaminophen Hives and Other (See Comments)    Can take with Benadryl    Current Outpatient Medications on File Prior to Visit  Medication Sig Dispense Refill   albuterol (VENTOLIN HFA) 108 (90 Base) MCG/ACT inhaler INHALE 2 PUFFS INTO THE LUNGS EVERY 4 HOURS AS NEEDED FOR WHEEZING 1 each 2   amitriptyline (ELAVIL) 50 MG tablet Take 1 tablet (50 mg total) by mouth at bedtime. 90 tablet 1   amLODipine (NORVASC) 10 MG tablet TAKE 1 TABLET EVERY DAY (NEED MD APPOINTMENT) 90 tablet 1   aspirin 81 MG EC tablet Take 81 mg by mouth daily. Swallow whole.     atorvastatin (LIPITOR) 20 MG tablet TAKE 1 TABLET EVERY DAY 90 tablet 1    Blood Glucose Monitoring Suppl (TRUE METRIX METER) w/Device KIT Use as instructed. 1 kit 0   esomeprazole (NEXIUM) 40 MG capsule TAKE ONE CAPSULE BY MOUTH AT NOON 90 capsule 0   losartan-hydrochlorothiazide (HYZAAR) 100-25 MG tablet Take 1 tablet by mouth daily. 90 tablet 1   metoprolol tartrate (LOPRESSOR) 25 MG tablet Take 1 tablet (25 mg total) by mouth 2 (two) times daily. 180 tablet 1   mirabegron ER (MYRBETRIQ) 25 MG TB24 tablet Take 1 tablet (25 mg total) by mouth daily. 90 tablet 1   Multiple Vitamins-Calcium (ONE-A-DAY WOMENS PO) Take 1 tablet by mouth daily.     omeprazole (PRILOSEC) 40 MG capsule daily.     sitaGLIPtin-metformin (JANUMET) 50-1000 MG tablet Take 1 tablet by mouth 2 (two) times daily with a meal. 180 tablet 1   TRUE METRIX BLOOD GLUCOSE TEST test strip TEST BLOOD SUGAR EVERY DAY 100 strip 1   TRUEplus Lancets 30G MISC TEST BLOOD SUGAR EVERY DAY 100 each 11   Current Facility-Administered Medications on File Prior to Visit  Medication Dose Route Frequency Provider Last Rate Last Admin   0.9 %  sodium chloride infusion  500 mL Intravenous Once Cirigliano, Dominic Pea, DO       Social History   Socioeconomic History   Marital status: Single    Spouse name: Not on file  Number of children: Not on file   Years of education: Not on file   Highest education level: Not on file  Occupational History   Occupation: retired  Tobacco Use   Smoking status: Every Day    Packs/day: 0.50    Types: Cigarettes    Passive exposure: Current   Smokeless tobacco: Never   Tobacco comments:    down to 2 cigs a day   Vaping Use   Vaping Use: Never used  Substance and Sexual Activity   Alcohol use: No   Drug use: No   Sexual activity: Not Currently  Other Topics Concern   Not on file  Social History Narrative   Not on file   Social Determinants of Health   Financial Resource Strain: Not on file  Food Insecurity: Not on file  Transportation Needs: Not on file  Physical  Activity: Not on file  Stress: Not on file  Social Connections: Not on file  Intimate Partner Violence: Not on file   Family History  Problem Relation Age of Onset   Aneurysm Mother    Alzheimer's disease Father    Colon polyps Neg Hx    Colon cancer Neg Hx    Esophageal cancer Neg Hx    Stomach cancer Neg Hx    Rectal cancer Neg Hx      OBJECTIVE:  There were no vitals filed for this visit.  Physical Exam Vitals reviewed.  Constitutional:      Appearance: She is obese.  HENT:     Head: Normocephalic.     Right Ear: Tympanic membrane and external ear normal.     Left Ear: Tympanic membrane and external ear normal.     Mouth/Throat:     Mouth: Mucous membranes are moist.  Eyes:     Extraocular Movements: Extraocular movements intact.     Pupils: Pupils are equal, round, and reactive to light.  Cardiovascular:     Rate and Rhythm: Normal rate and regular rhythm.  Pulmonary:     Effort: Pulmonary effort is normal.     Breath sounds: Normal breath sounds.  Abdominal:     General: Abdomen is flat.     Palpations: Abdomen is soft.  Musculoskeletal:        General: Normal range of motion.     Cervical back: Normal range of motion and neck supple.  Neurological:     Mental Status: She is alert and oriented to person, place, and time.  Psychiatric:        Mood and Affect: Mood normal.        Behavior: Behavior normal.        Thought Content: Thought content normal.        Judgment: Judgment normal.     ROS Comprehensive ROS Pertinent positive and negative noted in HPI   Last 3 Office BP readings: BP Readings from Last 3 Encounters:  10/06/21 128/76  07/03/21 118/69  01/15/21 (!) 146/85    BMET    Component Value Date/Time   NA 141 10/06/2021 0920   K 4.1 10/06/2021 0920   CL 100 10/06/2021 0920   CO2 24 10/06/2021 0920   GLUCOSE 114 (H) 10/06/2021 0920   GLUCOSE 623 (HH) 09/22/2019 0140   BUN 12 10/06/2021 0920   CREATININE 0.89 10/06/2021 0920    CREATININE 0.83 01/16/2014 1205   CALCIUM 10.9 (H) 10/06/2021 0920   GFRNONAA 59 (L) 10/28/2020 1129   GFRAA 68 10/28/2020 1129    Renal function:  CrCl cannot be calculated (Patient's most recent lab result is older than the maximum 21 days allowed.).  Clinical ASCVD: Yes  The ASCVD Risk score (Arnett DK, et al., 2019) failed to calculate for the following reasons:   The valid total cholesterol range is 130 to 320 mg/dL  ASCVD risk factors include- [place]   ASSESSMENT & PLAN: Amber Meyer was seen today for blood pressure check and diabetes.  Diagnoses and all orders for this visit:  Essential hypertension -Counseled on lifestyle modifications for blood pressure control including reduced dietary sodium, increased exercise, weight reduction and adequate sleep. Also, educated patient about the risk for cardiovascular events, stroke and heart attack. Also counseled patient about the importance of medication adherence. If you participate in smoking, it is important to stop using tobacco as this will increase the risks associated with uncontrolled blood pressure.   -Hypertension longstanding/newly diagnosed currently  amLODipine (NORVASC) 10 MG tablet; TAKE 1 TABLET EVERY DAY ,losartan-hydrochlorothiazide (HYZAAR) 100-25 MG tablet; Take 1 tablet by mouth daily. And metoprolol tartrate (LOPRESSOR) 25 MG tablet; Take 1 tablet (25 mg total) by mouth 2 (twice a day  on current medications. Patient is adherent with current medications.   Goal BP:  For patients younger than 60: Goal BP < 130/80. For patients 60 and older: Goal BP < 140/90. For patients with diabetes: Goal BP < 130/80. Your most recent BP: 130/80  Minimize salt intake. Minimize alcohol intake  Type 2 diabetes mellitus without complication, without long-term current use of insulin (HCC) -     HgB A1c 6.3 slight increase due to holidays -     sitaGLIPtin-metformin (JANUMET) 50-1000 MG tablet; Take 1 tablet by mouth 2 (two) times  daily with a meal.  Gastric pain -     Ambulatory referral to Gastroenterology  Medication refill -     amitriptyline (ELAVIL) 50 MG tablet; Take 1 tablet (50 mg total) by mouth at bedtime. -     amLODipine (NORVASC) 10 MG tablet; TAKE 1 TABLET EVERY DAY (NEED MD APPOINTMENT) -     losartan-hydrochlorothiazide (HYZAAR) 100-25 MG tablet; Take 1 tablet by mouth daily. -     metoprolol tartrate (LOPRESSOR) 25 MG tablet; Take 1 tablet (25 mg total) by mouth 2 (two) times daily.  Insomnia, unspecified type -     amitriptyline (ELAVIL) 50 MG tablet; Take 1 tablet (50 mg total) by mouth at bedtime.  Overactive bladder -     mirabegron ER (MYRBETRIQ) 25 MG TB24 tablet; Take 1 tablet (25 mg total) by mouth daily.  Bronchospasm -     albuterol (VENTOLIN HFA) 108 (90 Base) MCG/ACT inhaler; Inhale 2 puffs every 4 hours as needed for wheezing  This note has been created with Surveyor, quantity. Any transcriptional errors are unintentional.   Kerin Perna, NP 02/19/2022, 3:21 PM

## 2022-02-20 ENCOUNTER — Telehealth: Payer: Self-pay | Admitting: Gastroenterology

## 2022-02-20 DIAGNOSIS — Z1211 Encounter for screening for malignant neoplasm of colon: Secondary | ICD-10-CM

## 2022-02-20 MED ORDER — PEG 3350-KCL-NA BICARB-NACL 420 G PO SOLR: 4000.0000 mL | Freq: Once | ORAL | 0 refills | Status: AC

## 2022-02-20 NOTE — Telephone Encounter (Signed)
Inbound call from patient stated that she needed a pre authorization for her prep medication. Please advise.  ?

## 2022-02-20 NOTE — Telephone Encounter (Signed)
Spoke with patient the suprep is over $100. Changed prep to [place], new prep instructions mailed to patient and new rx sent to pharmacy. Pt verbalizes understanding.  ?

## 2022-03-05 ENCOUNTER — Ambulatory Visit (AMBULATORY_SURGERY_CENTER): Payer: Medicare HMO | Admitting: Gastroenterology

## 2022-03-05 ENCOUNTER — Encounter: Payer: Self-pay | Admitting: Gastroenterology

## 2022-03-05 VITALS — BP 133/55 | HR 81 | Temp 96.6°F | Resp 16 | Ht 67.0 in | Wt 236.0 lb

## 2022-03-05 DIAGNOSIS — K641 Second degree hemorrhoids: Secondary | ICD-10-CM

## 2022-03-05 DIAGNOSIS — K5289 Other specified noninfective gastroenteritis and colitis: Secondary | ICD-10-CM

## 2022-03-05 DIAGNOSIS — K635 Polyp of colon: Secondary | ICD-10-CM | POA: Diagnosis not present

## 2022-03-05 DIAGNOSIS — D122 Benign neoplasm of ascending colon: Secondary | ICD-10-CM

## 2022-03-05 DIAGNOSIS — K5730 Diverticulosis of large intestine without perforation or abscess without bleeding: Secondary | ICD-10-CM | POA: Diagnosis not present

## 2022-03-05 DIAGNOSIS — Z1211 Encounter for screening for malignant neoplasm of colon: Secondary | ICD-10-CM | POA: Diagnosis not present

## 2022-03-05 DIAGNOSIS — K529 Noninfective gastroenteritis and colitis, unspecified: Secondary | ICD-10-CM | POA: Diagnosis not present

## 2022-03-05 DIAGNOSIS — D128 Benign neoplasm of rectum: Secondary | ICD-10-CM

## 2022-03-05 DIAGNOSIS — Z86010 Personal history of colonic polyps: Secondary | ICD-10-CM

## 2022-03-05 DIAGNOSIS — K621 Rectal polyp: Secondary | ICD-10-CM | POA: Diagnosis not present

## 2022-03-05 MED ORDER — SODIUM CHLORIDE 0.9 % IV SOLN: 500.0000 mL | Freq: Once | INTRAVENOUS | Status: DC

## 2022-03-05 NOTE — Progress Notes (Signed)
Pt awake, report to RN, VVS  °

## 2022-03-05 NOTE — Progress Notes (Signed)
? ?GASTROENTEROLOGY PROCEDURE H&P NOTE  ? ?Primary Care Physician: ?Kerin Perna, NP ? ? ? ?Reason for Procedure:  Colon Cancer screening ? ?Plan:    Colonoscopy ? ?Patient is appropriate for endoscopic procedure(s) in the ambulatory ([place]) setting. ? ?The nature of the procedure, as well as the risks, benefits, and alternatives were carefully and thoroughly reviewed with the patient. Ample time for discussion and questions allowed. The patient understood, was satisfied, and agreed to proceed.  ? ? ? ?HPI: ?Amber Meyer is a 66 y.o. female who presents for colonoscopy for routine Colon Cancer screening/polyp surveillance.  ? ?Colonoscopy in 01/2021 for routine screening notable for 5 mm hyperplastic polyp, pandiverticulosis, and internal hemorrhoids.  Also with fair prep and incomplete clearance, with recommendation for repeat colonoscopy in 1 year.  She presents today for continued screening/surveillance. ? ?Otherwise, no active GI symptoms.  No known family history of colon cancer or related malignancy.   ? ?Past Medical History:  ?Diagnosis Date  ? Allergy   ? seasonal  ? Arthritis   ? Cataract   ? right   ? GERD (gastroesophageal reflux disease)   ? Hyperlipidemia   ? Hypertension   ? MRSA infection   ? 2019  ? Renal disorder   ? 3 kidney stones  ? Sinus bradycardia seen on cardiac monitor   ? Urgency of urination   ? Urinary frequency   ? ? ?Past Surgical History:  ?Procedure Laterality Date  ? ABDOMINAL HYSTERECTOMY  1981  ? partial  ? COLONOSCOPY    ? Kidney stone removal Left 1999  ? KNEE ARTHROPLASTY Right 12/23/2017  ? Procedure: RIGHT TOTAL KNEE ARTHROPLASTY WITH COMPUTER NAVIGATION;  Surgeon: Rod Can, MD;  Location: [facility];  Service: Orthopedics;  Laterality: Right;  NEEDS RNFA  ? KNEE SURGERY Right 2013  ? cleaned out damaged tissue and arthritis  ? LITHOTRIPSY  2003, 2006  ? x 2  ? ? ?Prior to Admission medications   ?Medication Sig Start Date End Date Taking? Authorizing Provider   ?amitriptyline (ELAVIL) 50 MG tablet Take 1 tablet (50 mg total) by mouth at bedtime. 02/19/22  Yes Kerin Perna, NP  ?amLODipine (NORVASC) 10 MG tablet TAKE 1 TABLET EVERY DAY (NEED MD APPOINTMENT) 02/19/22  Yes Kerin Perna, NP  ?aspirin 81 MG EC tablet Take 81 mg by mouth daily. Swallow whole.   Yes [provider]  ?atorvastatin (LIPITOR) 20 MG tablet TAKE 1 TABLET EVERY DAY 10/06/21  Yes Kerin Perna, NP  ?Blood Glucose Monitoring Suppl (TRUE METRIX METER) w/Device KIT Use as instructed. 09/29/19  Yes Charlott Rakes, MD  ?esomeprazole (NEXIUM) 40 MG capsule TAKE ONE CAPSULE BY MOUTH AT NOON 01/12/22  Yes Kerin Perna, NP  ?losartan-hydrochlorothiazide (HYZAAR) 100-25 MG tablet Take 1 tablet by mouth daily. 02/19/22  Yes Kerin Perna, NP  ?metoprolol tartrate (LOPRESSOR) 25 MG tablet Take 1 tablet (25 mg total) by mouth 2 (two) times daily. 02/19/22  Yes Kerin Perna, NP  ?Multiple Vitamins-Calcium (ONE-A-DAY WOMENS PO) Take 1 tablet by mouth daily.   Yes [provider]  ?oxybutynin (DITROPAN-XL) 10 MG 24 hr tablet  01/28/22  Yes [provider]  ?sitaGLIPtin-metformin (JANUMET) 50-1000 MG tablet Take 1 tablet by mouth 2 (two) times daily with a meal. 02/19/22  Yes Kerin Perna, NP  ?TRUE METRIX BLOOD GLUCOSE TEST test strip TEST BLOOD SUGAR EVERY DAY 01/23/22  Yes Kerin Perna, NP  ?TRUEplus Lancets 30G MISC TEST  BLOOD SUGAR EVERY DAY 07/18/21  Yes Kerin Perna, NP  ?albuterol (VENTOLIN HFA) 108 (90 Base) MCG/ACT inhaler Inhale 2 puffs every 4 hours as needed for wheezing 02/19/22   Kerin Perna, NP  ? ? ?Current Outpatient Medications  ?Medication Sig Dispense Refill  ? amitriptyline (ELAVIL) 50 MG tablet Take 1 tablet (50 mg total) by mouth at bedtime. 90 tablet 1  ? amLODipine (NORVASC) 10 MG tablet TAKE 1 TABLET EVERY DAY (NEED MD APPOINTMENT) 90 tablet 1  ? aspirin 81 MG EC tablet Take 81 mg by mouth daily. Swallow whole.     ? atorvastatin (LIPITOR) 20 MG tablet TAKE 1 TABLET EVERY DAY 90 tablet 1  ? Blood Glucose Monitoring Suppl (TRUE METRIX METER) w/Device KIT Use as instructed. 1 kit 0  ? esomeprazole (NEXIUM) 40 MG capsule TAKE ONE CAPSULE BY MOUTH AT NOON 90 capsule 0  ? losartan-hydrochlorothiazide (HYZAAR) 100-25 MG tablet Take 1 tablet by mouth daily. 90 tablet 1  ? metoprolol tartrate (LOPRESSOR) 25 MG tablet Take 1 tablet (25 mg total) by mouth 2 (two) times daily. 180 tablet 1  ? Multiple Vitamins-Calcium (ONE-A-DAY WOMENS PO) Take 1 tablet by mouth daily.    ? oxybutynin (DITROPAN-XL) 10 MG 24 hr tablet     ? sitaGLIPtin-metformin (JANUMET) 50-1000 MG tablet Take 1 tablet by mouth 2 (two) times daily with a meal. 180 tablet 1  ? TRUE METRIX BLOOD GLUCOSE TEST test strip TEST BLOOD SUGAR EVERY DAY 100 strip 1  ? TRUEplus Lancets 30G MISC TEST BLOOD SUGAR EVERY DAY 100 each 11  ? albuterol (VENTOLIN HFA) 108 (90 Base) MCG/ACT inhaler Inhale 2 puffs every 4 hours as needed for wheezing 1 each 2  ? ?Current Facility-Administered Medications  ?Medication Dose Route Frequency Provider Last Rate Last Admin  ? 0.9 %  sodium chloride infusion  500 mL Intravenous Once Dorlisa Savino V, DO      ? ? ?Allergies as of 03/05/2022 - Review Complete 03/05/2022  ?Allergen Reaction Noted  ? Hydrocodone-acetaminophen Hives and Other (See Comments) 03/24/2016  ? ? ?Family History  ?Problem Relation Age of Onset  ? Aneurysm Mother   ? Alzheimer's disease Father   ? Colon polyps Neg Hx   ? Colon cancer Neg Hx   ? Esophageal cancer Neg Hx   ? Stomach cancer Neg Hx   ? Rectal cancer Neg Hx   ? ? ?Social History  ? ?Socioeconomic History  ? Marital status: Single  ?  Spouse name: Not on file  ? Number of children: Not on file  ? Years of education: Not on file  ? Highest education level: Not on file  ?Occupational History  ? Occupation: retired  ?Tobacco Use  ? Smoking status: Every Day  ?  Packs/day: 0.50  ?  Types: Cigarettes  ?  Passive  exposure: Current  ? Smokeless tobacco: Never  ? Tobacco comments:  ?  down to 2 cigs a day   ?Vaping Use  ? Vaping Use: Never used  ?Substance and Sexual Activity  ? Alcohol use: No  ? Drug use: No  ? Sexual activity: Not Currently  ?Other Topics Concern  ? Not on file  ?Social History Narrative  ? Not on file  ? ?Social Determinants of Health  ? ?Financial Resource Strain: Not on file  ?Food Insecurity: Not on file  ?Transportation Needs: Not on file  ?Physical Activity: Not on file  ?Stress: Not on file  ?Social Connections: Not on  file  ?Intimate Partner Violence: Not on file  ? ? ?Physical Exam: ?Vital signs in last 24 hours: ?_0  128/61   Pulse 94   Temp (!) 96.6 ?F (35.9 ?C) (Temporal)   Ht _1  (1.702 m)   Wt 236 lb (107 kg)   SpO2 96%   BMI 36.96 kg/m?  ?GEN: NAD ?EYE: Sclerae anicteric ?ENT: MMM ?CV: Non-tachycardic ?Pulm: CTA b/l ?GI: Soft, NT/ND ?NEURO:  Alert & Oriented x 3 ? ? ?Gerrit Heck, DO ?[facility] ? ? ?03/05/2022 10:46 AM ? ?

## 2022-03-05 NOTE — Op Note (Signed)
[place] ?Patient Name: Amber Meyer ?Procedure Date: 03/05/2022 10:57 AM ?MRN: [id number] ?Endoscopist: Gerrit Heck , MD ?Age: 66 ?Referring MD:  ?Date of Birth: 06/26/1956 ?Gender: Female ?Account #: [id number] ?Procedure:                Colonoscopy ?Indications:              Screening for colorectal malignant neoplasm,  ?                          inadequate bowel prep on last colonoscopy (more  ?                          recent than 10 years ago) ?                          Colonoscopy in 01/2021 for routine screening notable  ?                          for 5 mm hyperplastic polyp, pandiverticulosis, and  ?                          internal hemorrhoids. Also with fair prep and  ?                          incomplete clearance, with recommendation for  ?                          repeat colonoscopy in 1 year. She presents today  ?                          for continued screening/surveillance. ?Medicines:                Monitored Anesthesia Care ?Procedure:                Pre-Anesthesia Assessment: ?                          - Prior to the procedure, a History and Physical  ?                          was performed, and patient medications and  ?                          allergies were reviewed. The patient's tolerance of  ?                          previous anesthesia was also reviewed. The risks  ?                          and benefits of the procedure and the sedation  ?                          options and risks were discussed with the patient.  ?  All questions were answered, and informed consent  ?                          was obtained. Prior Anticoagulants: The patient has  ?                          taken no previous anticoagulant or antiplatelet  ?                          agents. ASA Grade Assessment: II - A patient with  ?                          mild systemic disease. After reviewing the risks  ?                          and benefits, the patient was deemed in  ?                           satisfactory condition to undergo the procedure. ?                          After obtaining informed consent, the colonoscope  ?                          was passed under direct vision. Throughout the  ?                          procedure, the patient's blood pressure, pulse, and  ?                          oxygen saturations were monitored continuously. The  ?                          Olympus Scope [id number] was introduced through the  ?                          anus and advanced to the the cecum, identified by  ?                          appendiceal orifice and ileocecal valve. The  ?                          colonoscopy was performed without difficulty. The  ?                          patient tolerated the procedure well. The quality  ?                          of the bowel preparation was good. The ileocecal  ?                          valve, appendiceal orifice, and rectum were  ?  photographed. ?Scope In: 10:58:20 AM ?Scope Out: 11:27:55 AM ?Scope Withdrawal Time: 0 hours 23 minutes 47 seconds  ?Total Procedure Duration: 0 hours 29 minutes 35 seconds  ?Findings:                 Skin tags were found on perianal exam. ?                          A 5 mm polyp was found in the proximal ascending  ?                          colon. The polyp was sessile. The polyp was removed  ?                          with a cold snare. Resection and retrieval were  ?                          complete. Estimated blood loss was minimal. ?                          Four sessile polyps were found in the rectum. The  ?                          polyps were 2 to 3 mm in size. These polyps were  ?                          removed with a cold snare. Resection and retrieval  ?                          were complete. Estimated blood loss was minimal. ?                          Multiple small and large-mouthed diverticula were  ?                          found in the entire colon. ?                           Localized inflammation characterized by congestion  ?                          (edema), erythema, and aphthous ulcerations was  ?                          found in the proximal ascending colon and in the  ?                          cecum. Biopsies were taken with a cold forceps for  ?                          histology. Estimated blood loss was minimal. ?                          The ascending colon revealed moderately  excessive  ?                          looping. Advancing the scope required using manual  ?                          pressure. ?                          Non-bleeding internal hemorrhoids were found during  ?                          retroflexion. The hemorrhoids were small. ?Complications:            No immediate complications. ?Estimated Blood Loss:     Estimated blood loss was minimal. ?Impression:               - Perianal skin tags found on perianal exam. ?                          - One 5 mm polyp in the proximal ascending colon,  ?                          removed with a cold snare. Resected and retrieved. ?                          - Four 2 to 3 mm polyps in the rectum, removed with  ?                          a cold snare. Resected and retrieved. ?                          - Diverticulosis in the entire examined colon. ?                          - Localized inflammation was found in the proximal  ?                          ascending colon and in the cecum. Biopsied. ?                          - There was significant looping of the colon. ?                          - Non-bleeding internal hemorrhoids. ?Recommendation:           - Patient has a contact number available for  ?                          emergencies. The signs and symptoms of potential  ?                          delayed complications were discussed with the  ?                          patient. Return to  normal activities tomorrow.  ?                          Written discharge instructions were provided to the  ?                           patient. ?                          - Resume previous diet. ?                          - Continue present medications. ?                          - Await pathology results. ?                          - Repeat colonoscopy for surveillance based on  ?                          pathology results. ?                          - Return to GI office PRN. ?Gerrit Heck, MD ?03/05/2022 11:35:18 AM ?

## 2022-03-05 NOTE — Patient Instructions (Signed)
YOU HAD AN ENDOSCOPIC PROCEDURE TODAY AT THE [facility]:   Refer to the procedure report that was given to you for any specific questions about what was found during the examination.  If the procedure report does not answer your questions, please call your gastroenterologist to clarify.  If you requested that your care partner not be given the details of your procedure findings, then the procedure report has been included in a sealed envelope for you to review at your convenience later. ° °**Handouts given on polyps and diverticulosis** ° °YOU SHOULD EXPECT: Some feelings of bloating in the abdomen. Passage of more gas than usual.  Walking can help get rid of the air that was put into your GI tract during the procedure and reduce the bloating. If you had a lower endoscopy (such as a colonoscopy or flexible sigmoidoscopy) you may notice spotting of blood in your stool or on the toilet paper. If you underwent a bowel prep for your procedure, you may not have a normal bowel movement for a few days. ° °Please Note:  You might notice some irritation and congestion in your nose or some drainage.  This is from the oxygen used during your procedure.  There is no need for concern and it should clear up in a day or so. ° °SYMPTOMS TO REPORT IMMEDIATELY: ° °Following lower endoscopy (colonoscopy or flexible sigmoidoscopy): ° Excessive amounts of blood in the stool ° Significant tenderness or worsening of abdominal pains ° Swelling of the abdomen that is new, acute ° Fever of 100°F or higher ° °For urgent or emergent issues, a gastroenterologist can be reached at any hour by calling [phone]. °Do not use MyChart messaging for urgent concerns.  ° ° °DIET:  We do recommend a small meal at first, but then you may proceed to your regular diet.  Drink plenty of fluids but you should avoid alcoholic beverages for 24 hours. ° °ACTIVITY:  You should plan to take it easy for the rest of today and you should NOT DRIVE  or use heavy machinery until tomorrow (because of the sedation medicines used during the test).   ° °FOLLOW UP: °Our staff will call the number listed on your records 48-72 hours following your procedure to check on you and address any questions or concerns that you may have regarding the information given to you following your procedure. If we do not reach you, we will leave a message.  We will attempt to reach you two times.  During this call, we will ask if you have developed any symptoms of COVID 19. If you develop any symptoms (ie: fever, flu-like symptoms, shortness of breath, cough etc.) before then, please call [phone].  If you test positive for Covid 19 in the 2 weeks post procedure, please call and report this information to us.   ° °If any biopsies were taken you will be contacted by phone or by letter within the next 1-3 weeks.  Please call us at [phone] if you have not heard about the biopsies in 3 weeks.  ° ° °SIGNATURES/CONFIDENTIALITY: °You and/or your care partner have signed paperwork which will be entered into your electronic medical record.  These signatures attest to the fact that that the information above on your After Visit Summary has been reviewed and is understood.  Full responsibility of the confidentiality of this discharge information lies with you and/or your care-partner.  °

## 2022-03-05 NOTE — Progress Notes (Signed)
Called to room to assist during endoscopic procedure.  Patient ID and intended procedure confirmed with present staff. Received instructions for my participation in the procedure from the performing physician.  

## 2022-03-05 NOTE — Progress Notes (Signed)
Pt's states no medical or surgical changes since previsit or office visit.  Vitals  

## 2022-03-09 ENCOUNTER — Telehealth: Payer: Self-pay | Admitting: *Deleted

## 2022-03-09 NOTE — Telephone Encounter (Signed)
?  Follow up Call- ? ? ?  03/05/2022  ? 10:32 AM 01/15/2021  ? 10:58 AM  ?Call back number  ?Post procedure Call Back phone  # [phone]  ?Permission to leave phone message Yes Yes  ?  ? ?Patient questions: ? ?Do you have a fever, pain , or abdominal swelling? No. ?Pain Score  0 * ? ?Have you tolerated food without any problems? Yes.   ? ?Have you been able to return to your normal activities? Yes.   ? ?Do you have any questions about your discharge instructions: ?Diet   No. ?Medications  No. ?Follow up visit  No. ? ?Do you have questions or concerns about your Care? No. ? ?Actions: ?* If pain score is 4 or above: ?No action needed, pain <4. ? ? ?

## 2022-03-10 DIAGNOSIS — Z1231 Encounter for screening mammogram for malignant neoplasm of breast: Secondary | ICD-10-CM | POA: Diagnosis not present

## 2022-03-11 ENCOUNTER — Encounter (INDEPENDENT_AMBULATORY_CARE_PROVIDER_SITE_OTHER): Payer: Self-pay

## 2022-03-19 ENCOUNTER — Other Ambulatory Visit (INDEPENDENT_AMBULATORY_CARE_PROVIDER_SITE_OTHER): Payer: Self-pay | Admitting: Primary Care

## 2022-03-19 DIAGNOSIS — J9801 Acute bronchospasm: Secondary | ICD-10-CM

## 2022-03-30 ENCOUNTER — Other Ambulatory Visit (INDEPENDENT_AMBULATORY_CARE_PROVIDER_SITE_OTHER): Payer: Self-pay | Admitting: Primary Care

## 2022-03-30 DIAGNOSIS — Z760 Encounter for issue of repeat prescription: Secondary | ICD-10-CM

## 2022-03-30 NOTE — Telephone Encounter (Signed)
Sent to PCP ?

## 2022-03-30 NOTE — Telephone Encounter (Signed)
Requested Prescriptions  ?Pending Prescriptions Disp Refills  ?? esomeprazole (NEXIUM) 40 MG capsule [Pharmacy Med Name: ESOMEPRAZOLE MAGNESIUM 40 MG Capsule Delayed Release] 90 capsule 1  ?  Sig: TAKE 1 CAPSULE BY MOUTH AT NOON  ?  ? Gastroenterology: Proton Pump Inhibitors 2 Passed - 03/30/2022  3:49 PM  ?  ?  Passed - ALT in normal range and within 360 days  ?  ALT  ?Date Value Ref Range Status  ?10/06/2021 12 0 - 32 IU/L Final  ?   ?  ?  Passed - AST in normal range and within 360 days  ?  AST  ?Date Value Ref Range Status  ?10/06/2021 13 0 - 40 IU/L Final  ?   ?  ?  Passed - Valid encounter within last 12 months  ?  Recent Outpatient Visits   ?      ? 1 month ago Type 2 diabetes mellitus without complication, without long-term current use of insulin ([place])  ? [facility] Kerin Perna, NP  ? 5 months ago Medication refill  ? [facility] Kerin Perna, NP  ? 9 months ago Type 2 diabetes mellitus without complication, without long-term current use of insulin ([place])  ? [facility] Kerin Perna, NP  ? 1 year ago Type 2 diabetes mellitus without complication, without long-term current use of insulin ([place])  ? [facility] Kerin Perna, NP  ? 1 year ago Overactive bladder  ? [facility] Kerin Perna, NP  ?  ?  ?Future Appointments   ?        ? In 4 months Kerin Perna, NP [place]  ?  ? ?  ?  ?  ?? atorvastatin (LIPITOR) 20 MG tablet [Pharmacy Med Name: ATORVASTATIN CALCIUM 20 MG Tablet] 90 tablet 1  ?  Sig: TAKE 1 TABLET EVERY DAY  ?  ? Cardiovascular:  Antilipid - Statins Failed - 03/30/2022  3:49 PM  ?  ?  Failed - Lipid Panel in normal range within the last 12 months  ?  Cholesterol, Total  ?Date Value Ref Range Status  ?10/06/2021 122 100 - 199 mg/dL Final  ? ?LDL Chol Calc (NIH)  ?Date Value Ref Range Status  ?10/06/2021 54 0 - 99 mg/dL  Final  ? ?HDL  ?Date Value Ref Range Status  ?10/06/2021 45 >39 mg/dL Final  ? ?Triglycerides  ?Date Value Ref Range Status  ?10/06/2021 130 0 - 149 mg/dL Final  ? ?  ?  ?  Passed - Patient is not pregnant  ?  ?  Passed - Valid encounter within last 12 months  ?  Recent Outpatient Visits   ?      ? 1 month ago Type 2 diabetes mellitus without complication, without long-term current use of insulin ([place])  ? [facility] Kerin Perna, NP  ? 5 months ago Medication refill  ? [facility] Kerin Perna, NP  ? 9 months ago Type 2 diabetes mellitus without complication, without long-term current use of insulin ([place])  ? [facility] Kerin Perna, NP  ? 1 year ago Type 2 diabetes mellitus without complication, without long-term current use of insulin ([place])  ? [facility] Kerin Perna, NP  ? 1 year ago Overactive bladder  ? [place] [facility] Kerin Perna, NP  ?  ?  ?Future Appointments   ?        ? In 4 months Kerin Perna, NP [place]  ?  ? ?  ?  ?  ? ?

## 2022-03-30 NOTE — Telephone Encounter (Signed)
Pt following up on refill request.  Advised pt we do have the request, and waiting on pcp approval. ?

## 2022-04-03 ENCOUNTER — Other Ambulatory Visit (INDEPENDENT_AMBULATORY_CARE_PROVIDER_SITE_OTHER): Payer: Self-pay | Admitting: Primary Care

## 2022-04-03 DIAGNOSIS — J9801 Acute bronchospasm: Secondary | ICD-10-CM

## 2022-05-21 ENCOUNTER — Other Ambulatory Visit (INDEPENDENT_AMBULATORY_CARE_PROVIDER_SITE_OTHER): Payer: Self-pay | Admitting: Primary Care

## 2022-05-21 DIAGNOSIS — J9801 Acute bronchospasm: Secondary | ICD-10-CM

## 2022-05-21 NOTE — Telephone Encounter (Signed)
Requested Prescriptions  Pending Prescriptions Disp Refills  . albuterol (VENTOLIN HFA) 108 (90 Base) MCG/ACT inhaler [Pharmacy Med Name: ALBUTEROL SULFATE HFA 108 (90 Base) MCG/ACT Aerosol Solution] 1 each 5    Sig: INHALE 2 PUFFS EVERY 4 HOURS AS NEEDED FOR WHEEZING     Pulmonology:  Beta Agonists 2 Passed - 05/21/2022 10:47 AM      Passed - Last BP in normal range    BP Readings from Last 1 Encounters:  03/05/22 (!) 133/55         Passed - Last Heart Rate in normal range    Pulse Readings from Last 1 Encounters:  03/05/22 81         Passed - Valid encounter within last 12 months    Recent Outpatient Visits          3 months ago Type 2 diabetes mellitus without complication, without long-term current use of insulin ([place])   [place] [facility] Juluis Mire P, NP   7 months ago Medication refill   [place] Juluis Mire P, NP   10 months ago Type 2 diabetes mellitus without complication, without long-term current use of insulin ([place])   [place] Juluis Mire P, NP   1 year ago Type 2 diabetes mellitus without complication, without long-term current use of insulin ([place])   [place], [place], NP   2 years ago Overactive bladder   [place] Kerin Perna, NP      Future Appointments            In 3 months Oletta Lamas, [place] Cage, NP [place]

## 2022-06-11 ENCOUNTER — Other Ambulatory Visit (INDEPENDENT_AMBULATORY_CARE_PROVIDER_SITE_OTHER): Payer: Self-pay | Admitting: Primary Care

## 2022-06-11 DIAGNOSIS — J9801 Acute bronchospasm: Secondary | ICD-10-CM

## 2022-07-20 ENCOUNTER — Other Ambulatory Visit (INDEPENDENT_AMBULATORY_CARE_PROVIDER_SITE_OTHER): Payer: Self-pay | Admitting: Primary Care

## 2022-07-20 DIAGNOSIS — G4700 Insomnia, unspecified: Secondary | ICD-10-CM

## 2022-07-20 DIAGNOSIS — Z760 Encounter for issue of repeat prescription: Secondary | ICD-10-CM

## 2022-07-20 NOTE — Telephone Encounter (Signed)
Needs appt

## 2022-08-02 ENCOUNTER — Other Ambulatory Visit (INDEPENDENT_AMBULATORY_CARE_PROVIDER_SITE_OTHER): Payer: Self-pay | Admitting: Primary Care

## 2022-08-02 DIAGNOSIS — Z760 Encounter for issue of repeat prescription: Secondary | ICD-10-CM

## 2022-08-02 DIAGNOSIS — I10 Essential (primary) hypertension: Secondary | ICD-10-CM

## 2022-08-10 ENCOUNTER — Other Ambulatory Visit (INDEPENDENT_AMBULATORY_CARE_PROVIDER_SITE_OTHER): Payer: Self-pay | Admitting: Primary Care

## 2022-08-10 DIAGNOSIS — N3281 Overactive bladder: Secondary | ICD-10-CM

## 2022-08-10 NOTE — Telephone Encounter (Signed)
Routed to PCP 

## 2022-08-15 ENCOUNTER — Other Ambulatory Visit (INDEPENDENT_AMBULATORY_CARE_PROVIDER_SITE_OTHER): Payer: Self-pay | Admitting: Nurse Practitioner

## 2022-08-15 ENCOUNTER — Telehealth (INDEPENDENT_AMBULATORY_CARE_PROVIDER_SITE_OTHER): Payer: Self-pay | Admitting: Primary Care

## 2022-08-15 DIAGNOSIS — N3281 Overactive bladder: Secondary | ICD-10-CM

## 2022-08-15 DIAGNOSIS — E119 Type 2 diabetes mellitus without complications: Secondary | ICD-10-CM

## 2022-08-17 DIAGNOSIS — Z20822 Contact with and (suspected) exposure to covid-19: Secondary | ICD-10-CM | POA: Diagnosis not present

## 2022-08-18 DIAGNOSIS — Z20822 Contact with and (suspected) exposure to covid-19: Secondary | ICD-10-CM | POA: Diagnosis not present

## 2022-08-18 NOTE — Telephone Encounter (Signed)
Routed to PCP 

## 2022-08-19 ENCOUNTER — Other Ambulatory Visit (INDEPENDENT_AMBULATORY_CARE_PROVIDER_SITE_OTHER): Payer: Self-pay | Admitting: Primary Care

## 2022-08-19 DIAGNOSIS — I10 Essential (primary) hypertension: Secondary | ICD-10-CM

## 2022-08-19 DIAGNOSIS — Z760 Encounter for issue of repeat prescription: Secondary | ICD-10-CM

## 2022-08-19 NOTE — Telephone Encounter (Signed)
Medication Refill - Medication: esomeprazole (NEXIUM) 40 MG capsule/metoprolol tartrate (LOPRESSOR) 25 MG tablet   Has the patient contacted their pharmacy? yes (Agent: If no, request that the patient contact the pharmacy for the refill. If patient does not wish to contact the pharmacy document the reason why and proceed with request.) (Agent: If yes, when and what did the pharmacy advise?)contact pcp  Preferred Pharmacy (with phone number or street name):  [place], [place] Phone:  [phone]  Fax:  [phone]     Has the patient been seen for an appointment in the last year OR does the patient have an upcoming appointment? yes  Agent: Please be advised that RX refills may take up to 3 business days. We ask that you follow-up with your pharmacy.

## 2022-08-19 NOTE — Telephone Encounter (Signed)
Patient, called in states doesn't need any of meds here accept the meformin. She also has an appt for 09/12 at 9:10.

## 2022-08-19 NOTE — Telephone Encounter (Signed)
Patient aware that all refills have been denied until she is seen on 08/25/22.

## 2022-08-20 MED ORDER — ESOMEPRAZOLE MAGNESIUM 40 MG PO CPDR: DELAYED_RELEASE_CAPSULE | ORAL | 1 refills | Status: DC

## 2022-08-20 NOTE — Telephone Encounter (Signed)
Refilled 08/02/22 # 180 with 1 refill. Requested Prescriptions  Signed Prescriptions Disp Refills   esomeprazole (NEXIUM) 40 MG capsule 90 capsule 1    Sig: TAKE 1 CAPSULE BY MOUTH AT NOON     Gastroenterology: Proton Pump Inhibitors 2 Passed - 08/19/2022  2:36 PM      Passed - ALT in normal range and within 360 days    ALT  Date Value Ref Range Status  10/06/2021 12 0 - 32 IU/L Final         Passed - AST in normal range and within 360 days    AST  Date Value Ref Range Status  10/06/2021 13 0 - 40 IU/L Final         Passed - Valid encounter within last 12 months    Recent Outpatient Visits          6 months ago Type 2 diabetes mellitus without complication, without long-term current use of insulin ([place])   [place] [facility] Juluis Mire P, NP   10 months ago Medication refill   [place] Juluis Mire P, NP   1 year ago Type 2 diabetes mellitus without complication, without long-term current use of insulin ([place])   [place] [facility] Juluis Mire P, NP   1 year ago Type 2 diabetes mellitus without complication, without long-term current use of insulin ([place])   [place], [place], NP   2 years ago Overactive bladder   [place], [place], NP      Future Appointments            In 5 days Oletta Lamas, [place] Cage, NP [place]           Refused Prescriptions Disp Refills  . metoprolol tartrate (LOPRESSOR) 25 MG tablet 180 tablet 1    Sig: Take 1 tablet (25 mg total) by mouth 2 (two) times daily.     Cardiovascular:  Beta Blockers Failed - 08/19/2022  2:36 PM      Failed - Valid encounter within last 6 months    Recent Outpatient Visits          6 months ago Type 2 diabetes mellitus without complication, without long-term current use of insulin ([place])   [place] [facility] Kerin Perna, NP   10  months ago Medication refill   [place], [place] P, NP   1 year ago Type 2 diabetes mellitus without complication, without long-term current use of insulin ([place])   [place] Juluis Mire P, NP   1 year ago Type 2 diabetes mellitus without complication, without long-term current use of insulin ([place])   [place], [place], NP   2 years ago Overactive bladder   [place], [place], NP      Future Appointments            In 5 days Kerin Perna, NP [place] BP in normal range    BP Readings from Last 1 Encounters:  03/05/22 (!) 133/55         Passed - Last Heart Rate in normal range    Pulse Readings from Last 1 Encounters:  03/05/22 81

## 2022-08-21 DIAGNOSIS — Z20822 Contact with and (suspected) exposure to covid-19: Secondary | ICD-10-CM | POA: Diagnosis not present

## 2022-08-22 DIAGNOSIS — Z20822 Contact with and (suspected) exposure to covid-19: Secondary | ICD-10-CM | POA: Diagnosis not present

## 2022-08-25 ENCOUNTER — Encounter (INDEPENDENT_AMBULATORY_CARE_PROVIDER_SITE_OTHER): Payer: Self-pay | Admitting: Primary Care

## 2022-08-25 ENCOUNTER — Ambulatory Visit (INDEPENDENT_AMBULATORY_CARE_PROVIDER_SITE_OTHER): Payer: Medicare HMO | Admitting: Primary Care

## 2022-08-25 VITALS — BP 123/81 | HR 96 | Resp 16 | Ht 67.0 in | Wt 230.2 lb

## 2022-08-25 DIAGNOSIS — Z760 Encounter for issue of repeat prescription: Secondary | ICD-10-CM

## 2022-08-25 DIAGNOSIS — I10 Essential (primary) hypertension: Secondary | ICD-10-CM

## 2022-08-25 DIAGNOSIS — H8110 Benign paroxysmal vertigo, unspecified ear: Secondary | ICD-10-CM

## 2022-08-25 DIAGNOSIS — G4700 Insomnia, unspecified: Secondary | ICD-10-CM

## 2022-08-25 DIAGNOSIS — Z20822 Contact with and (suspected) exposure to covid-19: Secondary | ICD-10-CM | POA: Diagnosis not present

## 2022-08-25 DIAGNOSIS — Z23 Encounter for immunization: Secondary | ICD-10-CM | POA: Diagnosis not present

## 2022-08-25 DIAGNOSIS — E119 Type 2 diabetes mellitus without complications: Secondary | ICD-10-CM

## 2022-08-25 LAB — POCT GLYCOSYLATED HEMOGLOBIN (HGB A1C): HbA1c, POC (controlled diabetic range): 6.1 % (ref 0.0–7.0)

## 2022-08-25 MED ORDER — JANUMET 50-1000 MG PO TABS: 1.0000 | ORAL_TABLET | Freq: Two times a day (BID) | ORAL | 1 refills | Status: DC

## 2022-08-25 MED ORDER — AMITRIPTYLINE HCL 50 MG PO TABS: 50.0000 mg | ORAL_TABLET | Freq: Every day | ORAL | 1 refills | Status: DC

## 2022-08-25 MED ORDER — MECLIZINE HCL 25 MG PO TABS: 25.0000 mg | ORAL_TABLET | Freq: Three times a day (TID) | ORAL | 0 refills | Status: DC | PRN

## 2022-08-25 NOTE — Progress Notes (Signed)
Subjective:  Patient ID: Amber Meyer, female    DOB: 22-Jun-1956  Age: 66 y.o. MRN: [id number]  CC: Diabetes   HPI AYSEL GILCHREST presents forFollow-up of diabetes. Patient does not check blood sugar at home  Compliant with meds - Yes Checking CBGs? Yes  Fasting avg - 88-147  Postprandial average -  Exercising regularly? - Yes Watching carbohydrate intake? - Yes Neuropathy ? - Yes Hypoglycemic events - No  - Recovers with :   Pertinent ROS:  Polyuria - No Polydipsia - No Vision problems - No  Medications as noted below. Taking them regularly without complication/adverse reaction being reported today.   History [name] has a past medical history of Allergy, Arthritis, Cataract, GERD (gastroesophageal reflux disease), Hyperlipidemia, Hypertension, MRSA infection, Renal disorder, Sinus bradycardia seen on cardiac monitor, Urgency of urination, and Urinary frequency.   She has a past surgical history that includes Abdominal hysterectomy (1981); Knee surgery (Right, 2013); Lithotripsy (2003, 2006); Kidney stone removal (Left, 1999); Knee Arthroplasty (Right, 12/23/2017); and Colonoscopy.   Her family history includes Alzheimer's disease in her father; Aneurysm in her mother.She reports that she has been smoking cigarettes. She has been smoking an average of .5 packs per day. She has been exposed to tobacco smoke. She has never used smokeless tobacco. She reports that she does not drink alcohol and does not use drugs.  Current Outpatient Medications on File Prior to Visit  Medication Sig Dispense Refill   albuterol (VENTOLIN HFA) 108 (90 Base) MCG/ACT inhaler INHALE 2 PUFFS EVERY 4 HOURS AS NEEDED FOR WHEEZING 1 each 5   amLODipine (NORVASC) 10 MG tablet TAKE 1 TABLET EVERY DAY (NEED MD APPOINTMENT) 90 tablet 1   aspirin 81 MG EC tablet Take 81 mg by mouth daily. Swallow whole.     atorvastatin (LIPITOR) 20 MG tablet TAKE 1 TABLET EVERY DAY 90 tablet 1   Blood Glucose  Monitoring Suppl (TRUE METRIX METER) w/Device KIT Use as instructed. 1 kit 0   esomeprazole (NEXIUM) 40 MG capsule TAKE 1 CAPSULE BY MOUTH AT NOON 90 capsule 1   losartan-hydrochlorothiazide (HYZAAR) 100-25 MG tablet TAKE 1 TABLET EVERY DAY 90 tablet 1   metoprolol tartrate (LOPRESSOR) 25 MG tablet TAKE 1 TABLET TWICE DAILY 180 tablet 1   Multiple Vitamins-Calcium (ONE-A-DAY WOMENS PO) Take 1 tablet by mouth daily.     MYRBETRIQ 25 MG TB24 tablet TAKE 1 TABLET EVERY DAY 90 tablet 1   TRUE METRIX BLOOD GLUCOSE TEST test strip TEST BLOOD SUGAR EVERY DAY 100 strip 1   TRUEplus Lancets 30G MISC TEST BLOOD SUGAR EVERY DAY 100 each 11   No current facility-administered medications on file prior to visit.    ROS Review of Systems  Objective:  BP 123/81   Pulse 96   Resp 16   Ht 5' 7"  (1.702 m)   Wt 230 lb 3.2 oz (104.4 kg)   SpO2 97%   BMI 36.05 kg/m   BP Readings from Last 3 Encounters:  08/25/22 123/81  03/05/22 (!) 133/55  02/19/22 130/80    Wt Readings from Last 3 Encounters:  08/25/22 230 lb 3.2 oz (104.4 kg)  03/05/22 236 lb (107 kg)  02/19/22 233 lb 6.4 oz (105.9 kg)    Physical Exam Physical exam: General: Vital signs reviewed.  Patient is well-developed and well-nourished, obese female in no acute distress and cooperative with exam. Head: Normocephalic and atraumatic. Eyes: EOMI, conjunctivae normal, no scleral icterus. Neck: Supple, trachea midline, normal ROM, no  JVD, masses, thyromegaly, or carotid bruit present. Cardiovascular: RRR, S1 normal, S2 normal, no murmurs, gallops, or rubs. Pulmonary/Chest: Clear to auscultation bilaterally, no wheezes, rales, or rhonchi. Abdominal: Soft, non-tender, non-distended, BS +, no masses, organomegaly, or guarding present. Musculoskeletal: No joint deformities, erythema, or stiffness, ROM full and nontender. Extremities: No lower extremity edema bilaterally,  pulses symmetric and intact bilaterally. No cyanosis or  clubbing. Neurological: A&O x3, Strength is normal Skin: Warm, dry and intact. No rashes or erythema. Psychiatric: Normal mood and affect. speech and behavior is normal. Cognition and memory are normal.    Lab Results  Component Value Date   HGBA1C 6.1 08/25/2022   HGBA1C 6.3 (A) 02/19/2022   HGBA1C 6.0 (A) 10/06/2021    Lab Results  Component Value Date   WBC 9.5 08/25/2022   HGB 14.1 08/25/2022   HCT 42.3 08/25/2022   PLT 384 08/25/2022   GLUCOSE 112 (H) 08/25/2022   CHOL 111 08/25/2022   TRIG 174 (H) 08/25/2022   HDL 47 08/25/2022   LDLCALC 36 08/25/2022   ALT 16 08/25/2022   AST 18 08/25/2022   NA 141 08/25/2022   K 4.6 08/25/2022   CL 101 08/25/2022   CREATININE 0.93 08/25/2022   BUN 12 08/25/2022   CO2 24 08/25/2022   TSH 1.160 07/03/2021   HGBA1C 6.1 08/25/2022     Assessment & Plan:  [name] was seen today for diabetes.  Diagnoses and all orders for this visit:  Type 2 diabetes mellitus without complication, without long-term current use of insulin (HCC) -     POCT glycosylated hemoglobin (Hb A1C) 6.1 A1c is improving previously 6.3.  If A1c continues to decline will consider changing Janumet to once daily-   she is at goal. ADA recommends the following therapeutic goals for glycemic control related to A1c measurements: Goal of therapy: Less than 6.5 hemoglobin A1c.  Reference clinical practice recommendations. Foods that are high in carbohydrates are the following rice, potatoes, breads, sugars, and pastas.  Reduction in the intake (eating) will assist in lowering your blood sugars.   sitaGLIPtin-metformin (JANUMET) 50-1000 MG tablet; Take 1 tablet by mouth 2 (two) times daily with a meal. -     Lipid Panel -     CBC with Differential  Medication refill -     sitaGLIPtin-metformin (JANUMET) 50-1000 MG tablet; Take 1 tablet by mouth 2 (two) times daily with a meal. -     amitriptyline (ELAVIL) 50 MG tablet; Take 1 tablet (50 mg total) by mouth at  bedtime.  Insomnia, unspecified type -     amitriptyline (ELAVIL) 50 MG tablet; Take 1 tablet (50 mg total) by mouth at bedtime.  Essential hypertension Well-controlled blood pressure should be less than 140/90 for her age. We have discussed target BP range and blood pressure goal.. We discussed the importance of compliance with medical therapy and DASH diet recommended, consequences of uncontrolled hypertension discussed.  - continue current BP medications  Continue Hyzaar 100/25 and amlodipine 10 follow-up taken daily.  Medication also helps with renal protection -     CMP14+EGFR  Benign paroxysmal positional vertigo, unspecified laterality -     meclizine (ANTIVERT) 25 MG tablet; Take 1 tablet (25 mg total) by mouth 3 (three) times daily as needed for dizziness. -     Ambulatory referral to Neurology  Need for immunization against influenza -     Flu Vaccine QUAD 21moIM (Fluarix, Fluzone & Alfiuria Quad PF)    I have changed PMardene Celeste  A. Neubauer's amitriptyline. I am also having her start on meclizine. Additionally, I am having her maintain her Multiple Vitamins-Calcium (ONE-A-DAY WOMENS PO), True Metrix Meter, True Metrix Blood Glucose Test, aspirin EC, atorvastatin, albuterol, losartan-hydrochlorothiazide, metoprolol tartrate, amLODipine, Myrbetriq, TRUEplus Lancets 30G, esomeprazole, and Janumet.  Meds ordered this encounter  Medications   sitaGLIPtin-metformin (JANUMET) 50-1000 MG tablet    Sig: Take 1 tablet by mouth 2 (two) times daily with a meal.    Dispense:  180 tablet    Refill:  1    Order Specific Question:   Supervising Provider    Answer:   Tresa Garter [[id number]]   amitriptyline (ELAVIL) 50 MG tablet    Sig: Take 1 tablet (50 mg total) by mouth at bedtime.    Dispense:  90 tablet    Refill:  1    Order Specific Question:   Supervising Provider    Answer:   Tresa Garter [[id number]]   meclizine (ANTIVERT) 25 MG tablet    Sig: Take 1 tablet (25 mg  total) by mouth 3 (three) times daily as needed for dizziness.    Dispense:  30 tablet    Refill:  0    Order Specific Question:   Supervising Provider    Answer:   Tresa Garter [id number]     Follow-up:   Return in about 3 months (around 11/24/2022) for re-evaluate weight / shingles vaccine.  The above assessment and management plan was discussed with the patient. The patient verbalized understanding of and has agreed to the management plan. Patient is aware to call the clinic if symptoms fail to improve or worsen. Patient is aware when to return to the clinic for a follow-up visit. Patient educated on when it is appropriate to go to the emergency department.   Juluis Mire, NP-C

## 2022-08-25 NOTE — Patient Instructions (Signed)
Calorie Counting for Weight Loss Calories are units of energy. Your body needs a certain number of calories from food to keep going throughout the day. When you eat or drink more calories than your body needs, your body stores the extra calories mostly as fat. When you eat or drink fewer calories than your body needs, your body burns fat to get the energy it needs. Calorie counting means keeping track of how many calories you eat and drink each day. Calorie counting can be helpful if you need to lose weight. If you eat fewer calories than your body needs, you should lose weight. Ask your health care provider what a healthy weight is for you. For calorie counting to work, you will need to eat the right number of calories each day to lose a healthy amount of weight per week. A dietitian can help you figure out how many calories you need in a day and will suggest ways to reach your calorie goal. A healthy amount of weight to lose each week is usually 1-2 lb (0.5-0.9 kg). This usually means that your daily calorie intake should be reduced by 500-750 calories. Eating 1,200-1,500 calories a day can help most women lose weight. Eating 1,500-1,800 calories a day can help most men lose weight. What do I need to know about calorie counting? Work with your health care provider or dietitian to determine how many calories you should get each day. To meet your daily calorie goal, you will need to: Find out how many calories are in each food that you would like to eat. Try to do this before you eat. Decide how much of the food you plan to eat. Keep a food log. Do this by writing down what you ate and how many calories it had. To successfully lose weight, it is important to balance calorie counting with a healthy lifestyle that includes regular activity. Where do I find calorie information?  The number of calories in a food can be found on a Nutrition Facts label. If a food does not have a Nutrition Facts label, try  to look up the calories online or ask your dietitian for help. Remember that calories are listed per serving. If you choose to have more than one serving of a food, you will have to multiply the calories per serving by the number of servings you plan to eat. For example, the label on a package of bread might say that a serving size is 1 slice and that there are 90 calories in a serving. If you eat 1 slice, you will have eaten 90 calories. If you eat 2 slices, you will have eaten 180 calories. How do I keep a food log? After each time that you eat, record the following in your food log as soon as possible: What you ate. Be sure to include toppings, sauces, and other extras on the food. How much you ate. This can be measured in cups, ounces, or number of items. How many calories were in each food and drink. The total number of calories in the food you ate. Keep your food log near you, such as in a pocket-sized notebook or on an app or website on your mobile phone. Some programs will calculate calories for you and show you how many calories you have left to meet your daily goal. What are some portion-control tips? Know how many calories are in a serving. This will help you know how many servings you can have of a certain   food. Use a measuring cup to measure serving sizes. You could also try weighing out portions on a kitchen scale. With time, you will be able to estimate serving sizes for some foods. Take time to put servings of different foods on your favorite plates or in your favorite bowls and cups so you know what a serving looks like. Try not to eat straight from a food's packaging, such as from a bag or box. Eating straight from the package makes it hard to see how much you are eating and can lead to overeating. Put the amount you would like to eat in a cup or on a plate to make sure you are eating the right portion. Use smaller plates, glasses, and bowls for smaller portions and to prevent  overeating. Try not to multitask. For example, avoid watching TV or using your computer while eating. If it is time to eat, sit down at a table and enjoy your food. This will help you recognize when you are full. It will also help you be more mindful of what and how much you are eating. What are tips for following this plan? Reading food labels Check the calorie count compared with the serving size. The serving size may be smaller than what you are used to eating. Check the source of the calories. Try to choose foods that are high in protein, fiber, and vitamins, and low in saturated fat, trans fat, and sodium. Shopping Read nutrition labels while you shop. This will help you make healthy decisions about which foods to buy. Pay attention to nutrition labels for low-fat or fat-free foods. These foods sometimes have the same number of calories or more calories than the full-fat versions. They also often have added sugar, starch, or salt to make up for flavor that was removed with the fat. Make a grocery list of lower-calorie foods and stick to it. Cooking Try to cook your favorite foods in a healthier way. For example, try baking instead of frying. Use low-fat dairy products. Meal planning Use more fruits and vegetables. One-half of your plate should be fruits and vegetables. Include lean proteins, such as chicken, turkey, and fish. Lifestyle Each week, aim to do one of the following: 150 minutes of moderate exercise, such as walking. 75 minutes of vigorous exercise, such as running. General information Know how many calories are in the foods you eat most often. This will help you calculate calorie counts faster. Find a way of tracking calories that works for you. Get creative. Try different apps or programs if writing down calories does not work for you. What foods should I eat?  Eat nutritious foods. It is better to have a nutritious, high-calorie food, such as an avocado, than a food with  few nutrients, such as a bag of potato chips. Use your calories on foods and drinks that will fill you up and will not leave you hungry soon after eating. Examples of foods that fill you up are nuts and nut butters, vegetables, lean proteins, and high-fiber foods such as whole grains. High-fiber foods are foods with more than 5 g of fiber per serving. Pay attention to calories in drinks. Low-calorie drinks include water and unsweetened drinks. The items listed above may not be a complete list of foods and beverages you can eat. Contact a dietitian for more information. What foods should I limit? Limit foods or drinks that are not good sources of vitamins, minerals, or protein or that are high in unhealthy fats. These   include: Candy. Other sweets. Sodas, specialty coffee drinks, alcohol, and juice. The items listed above may not be a complete list of foods and beverages you should avoid. Contact a dietitian for more information. How do I count calories when eating out? Pay attention to portions. Often, portions are much larger when eating out. Try these tips to keep portions smaller: Consider sharing a meal instead of getting your own. If you get your own meal, eat only half of it. Before you start eating, ask for a container and put half of your meal into it. When available, consider ordering smaller portions from the menu instead of full portions. Pay attention to your food and drink choices. Knowing the way food is cooked and what is included with the meal can help you eat fewer calories. If calories are listed on the menu, choose the lower-calorie options. Choose dishes that include vegetables, fruits, whole grains, low-fat dairy products, and lean proteins. Choose items that are boiled, broiled, grilled, or steamed. Avoid items that are buttered, battered, fried, or served with cream sauce. Items labeled as crispy are usually fried, unless stated otherwise. Choose water, low-fat milk,  unsweetened iced tea, or other drinks without added sugar. If you want an alcoholic beverage, choose a lower-calorie option, such as a glass of wine or light beer. Ask for dressings, sauces, and syrups on the side. These are usually high in calories, so you should limit the amount you eat. If you want a salad, choose a garden salad and ask for grilled meats. Avoid extra toppings such as bacon, cheese, or fried items. Ask for the dressing on the side, or ask for olive oil and vinegar or lemon to use as dressing. Estimate how many servings of a food you are given. Knowing serving sizes will help you be aware of how much food you are eating at restaurants. Where to find more information Centers for Disease Control and Prevention: www.cdc.gov U.[place]. Department of Agriculture: myplate.gov Summary Calorie counting means keeping track of how many calories you eat and drink each day. If you eat fewer calories than your body needs, you should lose weight. A healthy amount of weight to lose per week is usually 1-2 lb (0.5-0.9 kg). This usually means reducing your daily calorie intake by 500-750 calories. The number of calories in a food can be found on a Nutrition Facts label. If a food does not have a Nutrition Facts label, try to look up the calories online or ask your dietitian for help. Use smaller plates, glasses, and bowls for smaller portions and to prevent overeating. Use your calories on foods and drinks that will fill you up and not leave you hungry shortly after a meal. This information is not intended to replace advice given to you by your health care provider. Make sure you discuss any questions you have with your health care provider. Document Revised: 01/11/2020 Document Reviewed: 01/11/2020 Elsevier Patient Education  2023 [employer]vier Inc.  

## 2022-08-26 DIAGNOSIS — Z20822 Contact with and (suspected) exposure to covid-19: Secondary | ICD-10-CM | POA: Diagnosis not present

## 2022-08-26 LAB — CMP14+EGFR
ALT: 16 IU/L (ref 0–32)
AST: 18 IU/L (ref 0–40)
Albumin/Globulin Ratio: 1.5 (ref 1.2–2.2)
Albumin: 4.4 g/dL (ref 3.9–4.9)
Alkaline Phosphatase: 168 IU/L — ABNORMAL HIGH (ref 44–121)
BUN/Creatinine Ratio: 13 (ref 12–28)
BUN: 12 mg/dL (ref 8–27)
Bilirubin Total: 0.3 mg/dL (ref 0.0–1.2)
CO2: 24 mmol/L (ref 20–29)
Calcium: 10.8 mg/dL — ABNORMAL HIGH (ref 8.7–10.3)
Chloride: 101 mmol/L (ref 96–106)
Creatinine, Ser: 0.93 mg/dL (ref 0.57–1.00)
Globulin, Total: 3 g/dL (ref 1.5–4.5)
Glucose: 112 mg/dL — ABNORMAL HIGH (ref 70–99)
Potassium: 4.6 mmol/L (ref 3.5–5.2)
Sodium: 141 mmol/L (ref 134–144)
Total Protein: 7.4 g/dL (ref 6.0–8.5)
eGFR: 67 mL/min/{1.73_m2} (ref >59)

## 2022-08-26 LAB — CBC WITH DIFFERENTIAL/PLATELET
Basophils Absolute: 0.1 10*3/uL (ref 0.0–0.2)
Basos: 1 %
EOS (ABSOLUTE): 0.3 10*3/uL (ref 0.0–0.4)
Eos: 3 %
Hematocrit: 42.3 % (ref 34.0–46.6)
Hemoglobin: 14.1 g/dL (ref 11.1–15.9)
Immature Grans (Abs): 0 10*3/uL (ref 0.0–0.1)
Immature Granulocytes: 0 %
Lymphocytes Absolute: 2.8 10*3/uL (ref 0.7–3.1)
Lymphs: 30 %
MCH: 29.4 pg (ref 26.6–33.0)
MCHC: 33.3 g/dL (ref 31.5–35.7)
MCV: 88 fL (ref 79–97)
Monocytes Absolute: 0.4 10*3/uL (ref 0.1–0.9)
Monocytes: 4 %
Neutrophils Absolute: 5.9 10*3/uL (ref 1.4–7.0)
Neutrophils: 62 %
Platelets: 384 10*3/uL (ref 150–450)
RBC: 4.79 x10E6/uL (ref 3.77–5.28)
RDW: 14.3 % (ref 11.7–15.4)
WBC: 9.5 10*3/uL (ref 3.4–10.8)

## 2022-08-26 LAB — LIPID PANEL
Chol/HDL Ratio: 2.4 ratio (ref 0.0–4.4)
Cholesterol, Total: 111 mg/dL (ref 100–199)
HDL: 47 mg/dL (ref >39)
LDL Chol Calc (NIH): 36 mg/dL (ref 0–99)
Triglycerides: 174 mg/dL — ABNORMAL HIGH (ref 0–149)
VLDL Cholesterol Cal: 28 mg/dL (ref 5–40)

## 2022-08-29 DIAGNOSIS — Z20822 Contact with and (suspected) exposure to covid-19: Secondary | ICD-10-CM | POA: Diagnosis not present

## 2022-08-30 DIAGNOSIS — Z20822 Contact with and (suspected) exposure to covid-19: Secondary | ICD-10-CM | POA: Diagnosis not present

## 2022-08-31 ENCOUNTER — Other Ambulatory Visit (INDEPENDENT_AMBULATORY_CARE_PROVIDER_SITE_OTHER): Payer: Self-pay | Admitting: Primary Care

## 2022-08-31 ENCOUNTER — Telehealth (INDEPENDENT_AMBULATORY_CARE_PROVIDER_SITE_OTHER): Payer: Self-pay | Admitting: Primary Care

## 2022-08-31 NOTE — Telephone Encounter (Unsigned)
[place] called and spoke to [facility], Education administrator about the refill(s) meclizine requested. Advised it was sent on 08/25/22 #30/0 refill(s). He says it was received and is on hold because a warning if the patient takes meclizine while taking amitriptyline, it can cause anticholinergic side effects such as dizziness, drowsiness or confusion. He says the provider needs to provide an approval for them to proceed with filling meclizine. I advised I will send this to the provider and someone will call back with that approval.

## 2022-08-31 NOTE — Telephone Encounter (Signed)
Will forward to provider  

## 2022-08-31 NOTE — Telephone Encounter (Signed)
Please resend rx for meclizine (ANTIVERT) 25 MG tablet to [facility] [address]  [place], [place] [address]  Phone:  [phone]  Fax:  [phone]

## 2022-08-31 NOTE — Telephone Encounter (Signed)
Meclizine d/c

## 2022-09-02 DIAGNOSIS — Z20822 Contact with and (suspected) exposure to covid-19: Secondary | ICD-10-CM | POA: Diagnosis not present

## 2022-09-03 DIAGNOSIS — Z20822 Contact with and (suspected) exposure to covid-19: Secondary | ICD-10-CM | POA: Diagnosis not present

## 2022-09-06 ENCOUNTER — Other Ambulatory Visit (INDEPENDENT_AMBULATORY_CARE_PROVIDER_SITE_OTHER): Payer: Self-pay | Admitting: Primary Care

## 2022-09-06 DIAGNOSIS — Z760 Encounter for issue of repeat prescription: Secondary | ICD-10-CM

## 2022-09-06 MED ORDER — ATORVASTATIN CALCIUM 20 MG PO TABS: 20.0000 mg | ORAL_TABLET | Freq: Every day | ORAL | 1 refills | Status: DC

## 2022-09-14 ENCOUNTER — Other Ambulatory Visit (INDEPENDENT_AMBULATORY_CARE_PROVIDER_SITE_OTHER): Payer: Self-pay | Admitting: Primary Care

## 2022-09-14 DIAGNOSIS — H8110 Benign paroxysmal vertigo, unspecified ear: Secondary | ICD-10-CM

## 2022-09-14 NOTE — Telephone Encounter (Signed)
Meclizine refill request refused because discontinued 08/31/2022.

## 2022-09-15 ENCOUNTER — Ambulatory Visit (INDEPENDENT_AMBULATORY_CARE_PROVIDER_SITE_OTHER): Payer: Medicare HMO | Admitting: Primary Care

## 2022-09-15 ENCOUNTER — Encounter (INDEPENDENT_AMBULATORY_CARE_PROVIDER_SITE_OTHER): Payer: Self-pay | Admitting: Primary Care

## 2022-09-15 VITALS — BP 119/81 | HR 99 | Resp 16 | Wt 228.8 lb

## 2022-09-15 DIAGNOSIS — H6122 Impacted cerumen, left ear: Secondary | ICD-10-CM

## 2022-09-15 DIAGNOSIS — R051 Acute cough: Secondary | ICD-10-CM

## 2022-09-15 MED ORDER — GUAIFENESIN 100 MG/5ML PO LIQD: 5.0000 mL | ORAL | 0 refills | Status: DC | PRN

## 2022-09-15 MED ORDER — BENZONATATE 100 MG PO CAPS: 100.0000 mg | ORAL_CAPSULE | Freq: Three times a day (TID) | ORAL | 0 refills | Status: DC | PRN

## 2022-09-15 NOTE — Progress Notes (Signed)
[facility]   Arrival Time:   CC:EAR PAIN  SUBJECTIVE: History from: patient.  Amber Meyer is a 66 y.o. female who presents right ear irrigation from cerumen impaction.  Denies a precipitating event, such as swimming or wearing ear plugs.  Patient states the pain is constant and achy in character.  Patient has tried  nothing  with/ without relief.  Symptoms are made worse with lying down.  Denies fever, chills, fatigue, sinus pain, rhinorrhea, ear discharge, sore throat, SOB, wheezing, chest pain, nausea, changes in bowel or bladder habits.  She does have a new onset of cough with thick phlegm that is difficult to get up and out. This has been taking place for over 2 weeks.  She notes she has not been aware anyone who has been sick.  ROS: As per HPI.  All other pertinent ROS negative.     Past Medical History:  Diagnosis Date   Allergy    seasonal   Arthritis    Cataract    right    GERD (gastroesophageal reflux disease)    Hyperlipidemia    Hypertension    MRSA infection    2019   Renal disorder    3 kidney stones   Sinus bradycardia seen on cardiac monitor    Urgency of urination    Urinary frequency    Past Surgical History:  Procedure Laterality Date   ABDOMINAL HYSTERECTOMY  1981   partial   COLONOSCOPY     Kidney stone removal Left 1999   KNEE ARTHROPLASTY Right 12/23/2017   Procedure: RIGHT TOTAL KNEE ARTHROPLASTY WITH COMPUTER NAVIGATION;  Surgeon: Rod Can, MD;  Location: [facility];  Service: Orthopedics;  Laterality: Right;  NEEDS RNFA   KNEE SURGERY Right 2013   cleaned out damaged tissue and arthritis   LITHOTRIPSY  2003, 2006   x 2   Allergies  Allergen Reactions   Hydrocodone-Acetaminophen Hives and Other (See Comments)    Can take with Benadryl    Current Outpatient Medications on File Prior to Visit  Medication Sig Dispense Refill   albuterol (VENTOLIN HFA) 108 (90 Base) MCG/ACT inhaler INHALE 2 PUFFS EVERY 4 HOURS AS  NEEDED FOR WHEEZING 1 each 5   amitriptyline (ELAVIL) 50 MG tablet Take 1 tablet (50 mg total) by mouth at bedtime. 90 tablet 1   amLODipine (NORVASC) 10 MG tablet TAKE 1 TABLET EVERY DAY (NEED MD APPOINTMENT) 90 tablet 1   aspirin 81 MG EC tablet Take 81 mg by mouth daily. Swallow whole.     atorvastatin (LIPITOR) 20 MG tablet Take 1 tablet (20 mg total) by mouth daily. 90 tablet 1   Blood Glucose Monitoring Suppl (TRUE METRIX METER) w/Device KIT Use as instructed. 1 kit 0   esomeprazole (NEXIUM) 40 MG capsule TAKE 1 CAPSULE BY MOUTH AT NOON 90 capsule 1   losartan-hydrochlorothiazide (HYZAAR) 100-25 MG tablet TAKE 1 TABLET EVERY DAY 90 tablet 1   metoprolol tartrate (LOPRESSOR) 25 MG tablet TAKE 1 TABLET TWICE DAILY 180 tablet 1   Multiple Vitamins-Calcium (ONE-A-DAY WOMENS PO) Take 1 tablet by mouth daily.     MYRBETRIQ 25 MG TB24 tablet TAKE 1 TABLET EVERY DAY 90 tablet 1   sitaGLIPtin-metformin (JANUMET) 50-1000 MG tablet Take 1 tablet by mouth 2 (two) times daily with a meal. 180 tablet 1   TRUE METRIX BLOOD GLUCOSE TEST test strip TEST BLOOD SUGAR EVERY DAY 100 strip 1   TRUEplus Lancets 30G MISC TEST BLOOD SUGAR  EVERY DAY 100 each 11   No current facility-administered medications on file prior to visit.   Social History   Socioeconomic History   Marital status: Single    Spouse name: Not on file   Number of children: Not on file   Years of education: Not on file   Highest education level: Not on file  Occupational History   Occupation: retired  Tobacco Use   Smoking status: Every Day    Packs/day: 0.50    Types: Cigarettes    Passive exposure: Current   Smokeless tobacco: Never   Tobacco comments:    down to 2 cigs a day   Vaping Use   Vaping Use: Never used  Substance and Sexual Activity   Alcohol use: No   Drug use: No   Sexual activity: Not Currently  Other Topics Concern   Not on file  Social History Narrative   Not on file   Social Determinants of Health    Financial Resource Strain: Not on file  Food Insecurity: Not on file  Transportation Needs: Not on file  Physical Activity: Not on file  Stress: Not on file  Social Connections: Not on file  Intimate Partner Violence: Not on file   Family History  Problem Relation Age of Onset   Aneurysm Mother    Alzheimer's disease Father    Colon polyps Neg Hx    Colon cancer Neg Hx    Esophageal cancer Neg Hx    Stomach cancer Neg Hx    Rectal cancer Neg Hx     OBJECTIVE:  Vitals:   09/15/22 0900  BP: 119/81  Pulse: 99  Resp: 16  SpO2: 96%  Weight: 228 lb 12.8 oz (103.8 kg)     General appearance: alert; appears fatigued HEENT: Ears: EACs clear, TMs pearly gray with visible cone of light, without erythema; left ear only right ear has cerumen impaction eyes: PERRL, EOMI grossly; Sinuses nontender to palpation; Nose: clear rhinorrhea; Throat: oropharynx mildly erythematous, tonsils 1+ without white tonsillar exudates, uvula midline Neck: supple without LAD Lungs: unlabored respirations, symmetrical air entry; cough: moderate; no respiratory distress Heart: regular rate and rhythm.  Radial pulses 2+ symmetrical bilaterally Skin: warm and dry Psychological: alert and cooperative; normal mood and affect  Imaging: DG Bone Density  Result Date: 08/28/2021 EXAM: DUAL X-RAY ABSORPTIOMETRY (DXA) FOR BONE MINERAL DENSITY IMPRESSION: Referring Physician:  [place] Your patient completed a bone mineral density test using GE Lunar iDXA system (analysis version: 16). Technologist: Amber Meyer PATIENT: Name: Amber, Meyer Patient ID: [id number] Birth Date: 16-Aug-1956 Height: 66.5 in. Sex: Female Measured: 08/28/2021 Weight: 232.6 lbs. Indications: Diabetic non insulin, Estrogen Deficient, Hysterectomy, Nexium, Postmenopausal Fractures: NONE Treatments: Calcium (E943.0), Vitamin D (E933.5) ASSESSMENT: The BMD measured at Femur Neck Right is 1.057 g/cm2 with a T-score of 0.1. This patient is  considered normal according to [facility]) criteria. The quality of the exam is good. The lumbar spine was excluded due to degenerative changes. Site Region Measured Date Measured Age YA BMD Significant CHANGE T-score DualFemur Neck Right 08/28/2021 66.7 0.1 1.057 g/cm2 DualFemur Total Mean 08/28/2021 66.7 0.8 1.106 g/cm2 Left Forearm Radius 33% 08/28/2021 66.7 0.2 0.903 g/cm2 World Health Organization [facility]) criteria for post-menopausal, Caucasian Women: Normal       T-score at or above -1 SD Osteopenia   T-score between -1 and -2.5 SD Osteoporosis T-score at or below -2.5 SD RECOMMENDATION: 1. All patients should optimize calcium and vitamin D intake.  2. Consider FDA-approved medical therapies in postmenopausal women and men aged 43 years and older, based on the following: a. A hip or vertebral (clinical or morphometric) fracture. b. T-score = -2.5 at the femoral neck or spine after appropriate evaluation to exclude secondary causes. c. Low bone mass (T-score between -1.0 and -2.5 at the femoral neck or spine) and a 10-year probability of a hip fracture = 3% or a 10-year probability of a major osteoporosis-related fracture = 20% based on the [place]-adapted WHO algorithm. d. Clinician judgment and/or patient preferences may indicate treatment for people with 10-year fracture probabilities above or below these levels. FOLLOW-UP: Patients with diagnosis of osteoporosis or at high risk for fracture should have regular bone mineral density tests.? Patients eligible for Medicare are allowed routine testing every 2 years.? The testing frequency can be increased to one year for patients who have rapidly progressing disease, are receiving or discontinuing medical therapy to restore bone mass, or have additional risk factors. I have reviewed this study and agree with the findings. [facility] Electronically Signed   By: Rolm Baptise M.D.   On: 08/28/2021 11:54     ASSESSMENT &  PLAN: [name] was seen today for cerumen impaction.  Diagnoses and all orders for this visit:  Hearing loss of left ear due to cerumen impaction PROCEDURE: CERUMEN DISIMPACTION   The patient had a large amount of cerumen in the external auditory canal(s): right Ear wax softener was used prior to the lavage. Ear lavage was performed on right ear(s)  Curettage by provider was not performed in addition.  There were no complications and following the disimpaction the tympanic membrane was visible. Charges to be entered in Charge Capture section.    Acute cough -     benzonatate (TESSALON PERLES) 100 MG capsule; Take 1 capsule (100 mg total) by mouth 3 (three) times daily as needed for cough. -     guaiFENesin (ROBITUSSIN) 100 MG/5ML liquid; Take 5 mLs by mouth every 4 (four) hours as needed for cough or to loosen phlegm.     Meds ordered this encounter  Medications   benzonatate (TESSALON PERLES) 100 MG capsule    Sig: Take 1 capsule (100 mg total) by mouth 3 (three) times daily as needed for cough.    Dispense:  30 capsule    Refill:  0    Order Specific Question:   Supervising Provider    Answer:   Tresa Garter [[id number]]   guaiFENesin (ROBITUSSIN) 100 MG/5ML liquid    Sig: Take 5 mLs by mouth every 4 (four) hours as needed for cough or to loosen phlegm.    Dispense:  120 mL    Refill:  0    Order Specific Question:   Supervising Provider    Answer:   Tresa Garter [[id number]]   Reviewed expectations re: course of current medical issues. Questions answered. Outlined signs and symptoms indicating need for more acute intervention. Patient verbalized understanding. After Visit Summary given.  This note has been created with Surveyor, quantity. Any transcriptional errors are unintentional.   Kerin Perna, NP 09/15/2022, 10:30 PM

## 2022-09-23 ENCOUNTER — Ambulatory Visit: Payer: Self-pay | Admitting: Neurology

## 2022-09-25 ENCOUNTER — Other Ambulatory Visit (INDEPENDENT_AMBULATORY_CARE_PROVIDER_SITE_OTHER): Payer: Self-pay | Admitting: Primary Care

## 2022-09-25 DIAGNOSIS — R051 Acute cough: Secondary | ICD-10-CM

## 2022-09-25 NOTE — Telephone Encounter (Signed)
Requested medication (s) are due for refill today: unsure  Requested medication (s) are on the active medication list: yes  Last refill:  09/15/22, 10 supply  Future visit scheduled:yes  Notes to clinic:  Unable to refill per protocol, last refill by provider 09/15/22 for 10 day supply,. Should patient continue to take, routing for review.     Requested Prescriptions  Pending Prescriptions Disp Refills   benzonatate (TESSALON) 100 MG capsule [Pharmacy Med Name: BENZONATATE 100 MG Capsule] 30 capsule 10    Sig: TAKE 1 CAPSULE THREE TIMES DAILY AS NEEDED FOR COUGH     Ear, Nose, and Throat:  Antitussives/Expectorants Passed - 09/25/2022 10:49 AM      Passed - Valid encounter within last 12 months    Recent Outpatient Visits           1 week ago Hearing loss of left ear due to cerumen impaction   [place] Juluis Mire P, NP   1 month ago Type 2 diabetes mellitus without complication, without long-term current use of insulin ([place])   [place] Juluis Mire P, NP   7 months ago Type 2 diabetes mellitus without complication, without long-term current use of insulin ([place])   [place] Kerin Perna, NP   11 months ago Medication refill   [place] Juluis Mire P, NP   1 year ago Type 2 diabetes mellitus without complication, without long-term current use of insulin ([place])   [place] [facility] Kerin Perna, NP       Future Appointments             In 3 months Oletta Lamas, [place] Cage, NP [facility]

## 2022-10-02 NOTE — Telephone Encounter (Signed)
Will forward to provider  

## 2022-10-29 DIAGNOSIS — E1136 Type 2 diabetes mellitus with diabetic cataract: Secondary | ICD-10-CM | POA: Diagnosis not present

## 2022-10-29 DIAGNOSIS — H40013 Open angle with borderline findings, low risk, bilateral: Secondary | ICD-10-CM | POA: Diagnosis not present

## 2022-10-29 DIAGNOSIS — H52203 Unspecified astigmatism, bilateral: Secondary | ICD-10-CM | POA: Diagnosis not present

## 2022-10-29 DIAGNOSIS — H524 Presbyopia: Secondary | ICD-10-CM | POA: Diagnosis not present

## 2022-10-29 DIAGNOSIS — Z7984 Long term (current) use of oral hypoglycemic drugs: Secondary | ICD-10-CM | POA: Diagnosis not present

## 2022-10-29 DIAGNOSIS — H25813 Combined forms of age-related cataract, bilateral: Secondary | ICD-10-CM | POA: Diagnosis not present

## 2022-11-23 ENCOUNTER — Other Ambulatory Visit (INDEPENDENT_AMBULATORY_CARE_PROVIDER_SITE_OTHER): Payer: Self-pay | Admitting: Primary Care

## 2022-11-23 DIAGNOSIS — J9801 Acute bronchospasm: Secondary | ICD-10-CM

## 2022-11-24 ENCOUNTER — Ambulatory Visit (INDEPENDENT_AMBULATORY_CARE_PROVIDER_SITE_OTHER): Payer: Medicare HMO | Admitting: Primary Care

## 2022-12-25 ENCOUNTER — Emergency Department (HOSPITAL_COMMUNITY): Payer: Medicare PPO

## 2022-12-25 ENCOUNTER — Encounter (HOSPITAL_COMMUNITY): Payer: Self-pay

## 2022-12-25 ENCOUNTER — Other Ambulatory Visit: Payer: Self-pay

## 2022-12-25 ENCOUNTER — Emergency Department (HOSPITAL_COMMUNITY)
Admission: EM | Admit: 2022-12-25 | Discharge: 2022-12-25 | Disposition: A | Discharge status: Home / Self Care | Payer: Medicare PPO | Attending: Emergency Medicine | Admitting: Emergency Medicine

## 2022-12-25 DIAGNOSIS — D72829 Elevated white blood cell count, unspecified: Secondary | ICD-10-CM | POA: Insufficient documentation

## 2022-12-25 DIAGNOSIS — Z7982 Long term (current) use of aspirin: Secondary | ICD-10-CM | POA: Diagnosis not present

## 2022-12-25 DIAGNOSIS — S0990XA Unspecified injury of head, initial encounter: Secondary | ICD-10-CM | POA: Diagnosis not present

## 2022-12-25 DIAGNOSIS — Z20822 Contact with and (suspected) exposure to covid-19: Secondary | ICD-10-CM | POA: Insufficient documentation

## 2022-12-25 DIAGNOSIS — I10 Essential (primary) hypertension: Secondary | ICD-10-CM | POA: Insufficient documentation

## 2022-12-25 DIAGNOSIS — R739 Hyperglycemia, unspecified: Secondary | ICD-10-CM

## 2022-12-25 DIAGNOSIS — W19XXXA Unspecified fall, initial encounter: Secondary | ICD-10-CM | POA: Diagnosis not present

## 2022-12-25 DIAGNOSIS — Z79899 Other long term (current) drug therapy: Secondary | ICD-10-CM | POA: Diagnosis not present

## 2022-12-25 DIAGNOSIS — R000 Tachycardia, unspecified: Secondary | ICD-10-CM | POA: Insufficient documentation

## 2022-12-25 DIAGNOSIS — E1165 Type 2 diabetes mellitus with hyperglycemia: Secondary | ICD-10-CM | POA: Diagnosis not present

## 2022-12-25 DIAGNOSIS — R944 Abnormal results of kidney function studies: Secondary | ICD-10-CM | POA: Insufficient documentation

## 2022-12-25 DIAGNOSIS — R296 Repeated falls: Secondary | ICD-10-CM | POA: Diagnosis not present

## 2022-12-25 DIAGNOSIS — R002 Palpitations: Secondary | ICD-10-CM | POA: Diagnosis not present

## 2022-12-25 DIAGNOSIS — R0602 Shortness of breath: Secondary | ICD-10-CM | POA: Diagnosis not present

## 2022-12-25 LAB — URINALYSIS, ROUTINE W REFLEX MICROSCOPIC
Bilirubin Urine: NEGATIVE
Glucose, UA: >=500 mg/dL — AB
Hgb urine dipstick: NEGATIVE
Ketones, ur: NEGATIVE mg/dL
Leukocytes,Ua: NEGATIVE
Nitrite: NEGATIVE
Protein, ur: NEGATIVE mg/dL
Specific Gravity, Urine: 1.016 (ref 1.005–1.030)
pH: 7 (ref 5.0–8.0)

## 2022-12-25 LAB — I-STAT VENOUS BLOOD GAS, ED
Acid-Base Excess: 4 mmol/L — ABNORMAL HIGH (ref 0.0–2.0)
Bicarbonate: 26.6 mmol/L (ref 20.0–28.0)
Calcium, Ion: 1.21 mmol/L (ref 1.15–1.40)
HCT: 44 % (ref 36.0–46.0)
Hemoglobin: 15 g/dL (ref 12.0–15.0)
O2 Saturation: 100 %
Potassium: 4.9 mmol/L (ref 3.5–5.1)
Sodium: 135 mmol/L (ref 135–145)
TCO2: 28 mmol/L (ref 22–32)
pCO2, Ven: 34.3 mmHg — ABNORMAL LOW (ref 44–60)
pH, Ven: 7.498 — ABNORMAL HIGH (ref 7.25–7.43)
pO2, Ven: 202 mmHg — ABNORMAL HIGH (ref 32–45)

## 2022-12-25 LAB — CBG MONITORING, ED
Glucose-Capillary: 411 mg/dL — ABNORMAL HIGH (ref 70–99)
Glucose-Capillary: 225 mg/dL — ABNORMAL HIGH (ref 70–99)

## 2022-12-25 LAB — BASIC METABOLIC PANEL
Anion gap: 12 (ref 5–15)
BUN: 21 mg/dL (ref 8–23)
CO2: 26 mmol/L (ref 22–32)
Calcium: 11.4 mg/dL — ABNORMAL HIGH (ref 8.9–10.3)
Chloride: 96 mmol/L — ABNORMAL LOW (ref 98–111)
Creatinine, Ser: 1.15 mg/dL — ABNORMAL HIGH (ref 0.44–1.00)
GFR, Estimated: 52 mL/min — ABNORMAL LOW (ref >60)
Glucose, Bld: 371 mg/dL — ABNORMAL HIGH (ref 70–99)
Potassium: 4.8 mmol/L (ref 3.5–5.1)
Sodium: 134 mmol/L — ABNORMAL LOW (ref 135–145)

## 2022-12-25 LAB — CBC
HCT: 42.6 % (ref 36.0–46.0)
Hemoglobin: 14.4 g/dL (ref 12.0–15.0)
MCH: 29.3 pg (ref 26.0–34.0)
MCHC: 33.8 g/dL (ref 30.0–36.0)
MCV: 86.8 fL (ref 80.0–100.0)
Platelets: 448 10*3/uL — ABNORMAL HIGH (ref 150–400)
RBC: 4.91 MIL/uL (ref 3.87–5.11)
RDW: 14.4 % (ref 11.5–15.5)
WBC: 12.9 10*3/uL — ABNORMAL HIGH (ref 4.0–10.5)
nRBC: 0.2 % (ref 0.0–0.2)

## 2022-12-25 LAB — RESP PANEL BY RT-PCR (RSV, FLU A&B, COVID)  RVPGX2
Influenza A by PCR: NEGATIVE
Influenza B by PCR: NEGATIVE
Resp Syncytial Virus by PCR: NEGATIVE
SARS Coronavirus 2 by RT PCR: NEGATIVE

## 2022-12-25 LAB — TROPONIN I (HIGH SENSITIVITY)
Troponin I (High Sensitivity): 7 ng/L (ref <18)
Troponin I (High Sensitivity): 5 ng/L (ref <18)

## 2022-12-25 MED ORDER — LACTATED RINGERS IV BOLUS
1000.0000 mL | Freq: Once | INTRAVENOUS | Status: AC
  Administered 2022-12-25: 1000 mL via INTRAVENOUS

## 2022-12-25 MED ORDER — INSULIN ASPART 100 UNIT/ML IJ SOLN: 10.0000 [IU] | Freq: Once | INTRAMUSCULAR | Status: DC

## 2022-12-25 MED ORDER — SODIUM CHLORIDE 0.9 % IV BOLUS
2000.0000 mL | Freq: Once | INTRAVENOUS | Status: AC
  Administered 2022-12-25: 2000 mL via INTRAVENOUS

## 2022-12-25 NOTE — ED Notes (Signed)
Pt transported to CT

## 2022-12-25 NOTE — ED Notes (Signed)
CN notified pt in need of room

## 2022-12-25 NOTE — ED Provider Notes (Signed)
[place] EMERGENCY DEPARTMENT Provider Note   CSN: [id number] Arrival date & time: 12/25/22  1652     History  No chief complaint on file.   Amber Meyer is a 67 y.o. female.  67 year old female with past medical history of type 2 diabetes not insulin-dependent presents today for evaluation of palpitations, fall that occurred yesterday, and elevated blood sugar of around 411 just prior to arrival.  She states she woke up this morning and her blood sugar was 71.  She states after that she had 20 ounce Gatorade bottle, and peanut butter sandwich.  Her blood sugar then increased to about mid 200s around lunchtime, and then 400 just prior to arrival.  She denies abdominal pain, vomiting, chest pain, lightheadedness.  She states she fell twice yesterday.  Also endorses decreased fluid intake yesterday.  Denies loss of consciousness.  She states she hit her head on the doorknob as she was falling the first time. Not on anticoagulation.  Again she did not pass out either of the episodes of falling.  The history is provided by the patient. No language interpreter was used.       Home Medications Prior to Admission medications   Medication Sig Start Date End Date Taking? Authorizing Provider  albuterol (VENTOLIN HFA) 108 (90 Base) MCG/ACT inhaler INHALE 2 PUFFS EVERY 4 HOURS AS NEEDED FOR WHEEZING 11/23/22   Kerin Perna, NP  amitriptyline (ELAVIL) 50 MG tablet Take 1 tablet (50 mg total) by mouth at bedtime. 08/25/22   Kerin Perna, NP  amLODipine (NORVASC) 10 MG tablet TAKE 1 TABLET EVERY DAY (NEED MD APPOINTMENT) 08/02/22   Kerin Perna, NP  aspirin 81 MG EC tablet Take 81 mg by mouth daily. Swallow whole.    [provider]  atorvastatin (LIPITOR) 20 MG tablet Take 1 tablet (20 mg total) by mouth daily. 09/06/22   Kerin Perna, NP  benzonatate (TESSALON PERLES) 100 MG capsule Take 1 capsule (100 mg total) by mouth 3 (three) times  daily as needed for cough. 09/15/22   Kerin Perna, NP  Blood Glucose Monitoring Suppl (TRUE METRIX METER) w/Device KIT Use as instructed. 09/29/19   Charlott Rakes, MD  esomeprazole (NEXIUM) 40 MG capsule TAKE 1 CAPSULE BY MOUTH AT NOON 08/20/22   Kerin Perna, NP  guaiFENesin (ROBITUSSIN) 100 MG/5ML liquid Take 5 mLs by mouth every 4 (four) hours as needed for cough or to loosen phlegm. 09/15/22   Kerin Perna, NP  losartan-hydrochlorothiazide (HYZAAR) 100-25 MG tablet TAKE 1 TABLET EVERY DAY 08/02/22   Kerin Perna, NP  metoprolol tartrate (LOPRESSOR) 25 MG tablet TAKE 1 TABLET TWICE DAILY 08/02/22   Kerin Perna, NP  Multiple Vitamins-Calcium (ONE-A-DAY WOMENS PO) Take 1 tablet by mouth daily.    [provider]  MYRBETRIQ 25 MG TB24 tablet TAKE 1 TABLET EVERY DAY 08/10/22   Kerin Perna, NP  sitaGLIPtin-metformin (JANUMET) 50-1000 MG tablet Take 1 tablet by mouth 2 (two) times daily with a meal. 08/25/22   Kerin Perna, NP  TRUE METRIX BLOOD GLUCOSE TEST test strip TEST BLOOD SUGAR EVERY DAY 01/23/22   Kerin Perna, NP  TRUEplus Lancets 30G MISC TEST BLOOD SUGAR EVERY DAY 08/10/22   Kerin Perna, NP      Allergies    Hydrocodone-acetaminophen    Review of Systems   Review of Systems  Constitutional:  Negative for chills and fever.  Respiratory:  Negative for  shortness of breath.   Cardiovascular:  Negative for chest pain.  Gastrointestinal:  Negative for abdominal pain and nausea.  Neurological:  Negative for syncope and light-headedness.  All other systems reviewed and are negative.   Physical Exam Updated Vital Signs BP (!) 154/103   Pulse (!) 141   Temp 99.1 F (37.3 C) (Oral)   Resp 20   Ht '5\' 7"'$  (1.702 m)   Wt 101.2 kg   SpO2 100%   BMI 34.93 kg/m  Physical Exam Vitals and nursing note reviewed.  Constitutional:      General: She is not in acute distress.    Appearance: Normal appearance. She is not  ill-appearing.  HENT:     Head: Normocephalic and atraumatic.     Nose: Nose normal.  Eyes:     General: No scleral icterus.    Extraocular Movements: Extraocular movements intact.     Conjunctiva/sclera: Conjunctivae normal.  Cardiovascular:     Rate and Rhythm: Regular rhythm. Tachycardia present.     Pulses: Normal pulses.  Pulmonary:     Effort: Pulmonary effort is normal. No respiratory distress.     Breath sounds: Normal breath sounds. No wheezing or rales.  Abdominal:     General: There is no distension.     Tenderness: There is no abdominal tenderness.  Musculoskeletal:        General: Normal range of motion.     Cervical back: Normal range of motion.     Right lower leg: No edema.     Left lower leg: No edema.  Skin:    General: Skin is warm and dry.  Neurological:     General: No focal deficit present.     Mental Status: She is alert. Mental status is at baseline.     ED Results / Procedures / Treatments   Labs (all labs ordered are listed, but only abnormal results are displayed) Labs Reviewed  CBC - Abnormal; Notable for the following components:      Result Value   WBC 12.9 (*)    Platelets 448 (*)    All other components within normal limits  CBG MONITORING, ED - Abnormal; Notable for the following components:   Glucose-Capillary 411 (*)    All other components within normal limits  I-STAT VENOUS BLOOD GAS, ED - Abnormal; Notable for the following components:   pH, Ven 7.498 (*)    pCO2, Ven 34.3 (*)    pO2, Ven 202 (*)    Acid-Base Excess 4.0 (*)    All other components within normal limits  RESP PANEL BY RT-PCR (RSV, FLU A&B, COVID)  RVPGX2  BASIC METABOLIC PANEL  BLOOD GAS, VENOUS  URINALYSIS, ROUTINE W REFLEX MICROSCOPIC  TROPONIN I (HIGH SENSITIVITY)    EKG None  Radiology DG Chest 2 View  Result Date: 12/25/2022 CLINICAL DATA:  Shortness of breath EXAM: CHEST - 2 VIEW COMPARISON:  01/16/2014 FINDINGS: The heart size and mediastinal  contours are within normal limits. Aortic atherosclerosis. No focal airspace consolidation, pleural effusion, or pneumothorax. The visualized skeletal structures are unremarkable. IMPRESSION: No active cardiopulmonary disease. Electronically Signed   By: Davina Poke D.O.   On: 12/25/2022 18:05   CT Head Wo Contrast  Result Date: 12/25/2022 CLINICAL DATA:  Recurrent falls over past 2 days. Blunt head trauma. EXAM: CT HEAD WITHOUT CONTRAST TECHNIQUE: Contiguous axial images were obtained from the base of the skull through the vertex without intravenous contrast. RADIATION DOSE REDUCTION: This exam was performed according  to the departmental dose-optimization program which includes automated exposure control, adjustment of the mA and/or kV according to patient size and/or use of iterative reconstruction technique. COMPARISON:  09/22/2019 FINDINGS: Brain: No evidence of intracranial hemorrhage, acute infarction, hydrocephalus, extra-axial collection, or mass lesion/mass effect. Vascular:  No hyperdense vessel or other acute findings. Skull: No evidence of fracture or other significant bone abnormality. Sinuses/Orbits:  No acute findings. Other: None. IMPRESSION: Negative noncontrast head CT. Electronically Signed   By: Marlaine Hind M.D.   On: 12/25/2022 17:48    Procedures Procedures    Medications Ordered in ED Medications  sodium chloride 0.9 % bolus 2,000 mL (has no administration in time range)  insulin aspart (novoLOG) injection 10 Units (has no administration in time range)    ED Course/ Medical Decision Making/ A&P Clinical Course as of 12/25/22 2228  Fri Dec 25, 2022  1858 Stable 66 YOF with a hx of DM. Elevated blood sugars to 400 today. Endorses 2 falls.  Not DKA.  [CC]    Clinical Course User Index [CC] Tretha Sciara, MD                             Medical Decision Making Amount and/or Complexity of Data Reviewed Labs: ordered.  Risk Prescription drug  management.   Medical Decision Making / ED Course   This patient presents to the ED for concern of hyperglycemia, this involves an extensive number of treatment options, and is a complaint that carries with it a high risk of complications and morbidity.  The differential diagnosis includes DKA, HHS, hyperglycemia without DKA or HHS  MDM: 67 year old female presents today for evaluation of elevated blood sugars that she noticed today.  She denies abdominal pain, gastroenteritis type symptoms, recent infection.  She did have 2 falls yesterday.  Also endorses decreased fluid intake yesterday.  No loss of consciousness.  No prodromal symptoms.  No chest pain, shortness of breath.  She was tachycardic in triage with heart rates of 140s.  EKG shows sinus tachycardia without ischemic changes.  Suspect she is volume down.  Will order 2 L of fluids.  CBC shows leukocytosis of 12.9, no anemia.  BMP with creatinine of 1.15 otherwise without acute concerns.  Glucose 371.  UA without evidence of UTI.  VBG without acute concerns.  Troponin negative x 2.  Chest x-ray without acute cardiopulmonary process.  CT head without contrast without evidence of acute intracranial finding. Repeat CBG improved to 225 following 2 L fluid bolus.  Will give an additional fluid bolus in hopes of improving this further.  Otherwise she is appropriate for discharge.  Discussed keeping a close eye on blood sugars and following up with PCP to make any adjustments to her diabetic regimen as appropriate.  She is in agreement with this plan.  Patient is appropriate for discharge.  Discussed with attending who is in agreement with plan and has evaluated patient as well.  Tachycardia improved from 140s to upper 90s-low 100s.    Lab Tests: -I ordered, reviewed, and interpreted labs.   The pertinent results include:   Labs Reviewed  BASIC METABOLIC PANEL - Abnormal; Notable for the following components:      Result Value   Sodium 134 (*)     Chloride 96 (*)    Glucose, Bld 371 (*)    Creatinine, Ser 1.15 (*)    Calcium 11.4 (*)    GFR, Estimated 52 (*)  All other components within normal limits  CBC - Abnormal; Notable for the following components:   WBC 12.9 (*)    Platelets 448 (*)    All other components within normal limits  URINALYSIS, ROUTINE W REFLEX MICROSCOPIC - Abnormal; Notable for the following components:   APPearance HAZY (*)    Glucose, UA >=500 (*)    Bacteria, UA RARE (*)    All other components within normal limits  CBG MONITORING, ED - Abnormal; Notable for the following components:   Glucose-Capillary 411 (*)    All other components within normal limits  I-STAT VENOUS BLOOD GAS, ED - Abnormal; Notable for the following components:   pH, Ven 7.498 (*)    pCO2, Ven 34.3 (*)    pO2, Ven 202 (*)    Acid-Base Excess 4.0 (*)    All other components within normal limits  CBG MONITORING, ED - Abnormal; Notable for the following components:   Glucose-Capillary 225 (*)    All other components within normal limits  RESP PANEL BY RT-PCR (RSV, FLU A&B, COVID)  RVPGX2  BLOOD GAS, VENOUS  TROPONIN I (HIGH SENSITIVITY)  TROPONIN I (HIGH SENSITIVITY)      EKG  EKG Interpretation  Date/Time:    Ventricular Rate:    PR Interval:    QRS Duration:   QT Interval:    QTC Calculation:   R Axis:     Text Interpretation:           Imaging Studies ordered: I ordered imaging studies including chest x-ray, CT head without contrast I independently visualized and interpreted imaging. I agree with the radiologist interpretation   Medicines ordered and prescription drug management: Meds ordered this encounter  Medications   sodium chloride 0.9 % bolus 2,000 mL   DISCONTD: insulin aspart (novoLOG) injection 10 Units   lactated ringers bolus 1,000 mL    -I have reviewed the patients home medicines and have made adjustments as needed   Reevaluation: After the interventions noted above, I  reevaluated the patient and found that they have :improved  Co morbidities that complicate the patient evaluation  Past Medical History:  Diagnosis Date   Allergy    seasonal   Arthritis    Cataract    right    GERD (gastroesophageal reflux disease)    Hyperlipidemia    Hypertension    MRSA infection    2019   Renal disorder    3 kidney stones   Sinus bradycardia seen on cardiac monitor    Urgency of urination    Urinary frequency       Dispostion: Patient is appropriate for discharge.  Discharged in stable condition.  Admission considered however patient not in DKA with improvement in blood sugar as well as symptoms.   Final Clinical Impression(s) / ED Diagnoses Final diagnoses:  Hyperglycemia    Rx / DC Orders ED Discharge Orders     None         Evlyn Courier, PA-C 12/25/22 2235    Tretha Sciara, MD 12/26/22 1444

## 2022-12-25 NOTE — Discharge Instructions (Signed)
Your workup today was overall reassuring.  No evidence of DKA or other concerning cause of symptoms when you have elevated blood sugars.  You were given fluids in the emergency department with improvement in your blood sugars and symptoms.  You had 2 falls yesterday which I suspect were likely due to dehydration.  Recommend you closely follow your blood sugars at home and follow-up with your primary care provider so appropriate adjustments can be made to your blood sugar medication.  Any concerning symptoms return to the emergency room.

## 2022-12-25 NOTE — ED Triage Notes (Signed)
Pt states yesterday began feeling lightheaded, fell twice after losing balance; no loc; cbg 71 this am; states now glucose increased to 400; still endorses lightheadedness; some L arm pain after fall; no swelling or deformity noted

## 2022-12-25 NOTE — ED Provider Triage Note (Signed)
Emergency Medicine Provider Triage Evaluation Note  Amber Meyer , a 67 y.o. female  was evaluated in triage.  Pt complains of several falls over the last two days, felt like heart was racing, denies chest pain, reports labile blood sugar despite taking home janumet. Reports cough today. Denies fever, chills, shob. Tachycardic in triage rate in 140s, normal rhythm, CBG 411.   Review of Systems  Positive: Shob, cough, heart racing Negative: Chest pain, fever  Physical Exam  BP (!) 154/103   Pulse (!) 141   Temp 99.1 F (37.3 C) (Oral)   Resp 20   Ht '5\' 7"'$  (1.702 m)   Wt 101.2 kg   SpO2 100%   BMI 34.93 kg/m  Gen:   Awake, no distress   Resp:  Normal effort  MSK:   Moves extremities without difficulty  Other:  Tachycardic   Medical Decision Making  Medically screening exam initiated at 5:09 PM.  Appropriate orders placed.  Amber Meyer was informed that the remainder of the evaluation will be completed by another provider, this initial triage assessment does not replace that evaluation, and the importance of remaining in the ED until their evaluation is complete.  Workup initiated   Anselmo Pickler, [place] 12/25/22 1715

## 2022-12-29 ENCOUNTER — Ambulatory Visit (INDEPENDENT_AMBULATORY_CARE_PROVIDER_SITE_OTHER): Payer: Self-pay | Admitting: *Deleted

## 2022-12-29 NOTE — Telephone Encounter (Signed)
Will forward to provider  

## 2022-12-29 NOTE — Telephone Encounter (Signed)
  Chief Complaint: Hyperglycemia. Symptoms: None presently Frequency: ED Friday Pertinent Negatives: Patient denies any symptoms presently. Disposition: '[]'$ ED /'[]'$ Urgent Care (no appt availability in office) / '[]'$ Appointment(In office/virtual)/ '[]'$  [facility]/ '[]'$ Home Care/ '[]'$ Refused Recommended Disposition /'[]'$ [facility]/ '[x]'$  Follow-up with PCP Additional Notes: Pt seen in ED Friday, BS 411, states given 3 L's IV fluid, told to continue med and FU with PCP. BS this AM 230, during call 101. Takes Janumet at 0900 and 2100. BS at HS yesterday 402. Pt has appt Friday, calling to ensure waiting until Friday was appropriate. Triaged pt and care advise provided. Assured pt NT would route to practice for PCPs review and final disposition. Please advise.

## 2022-12-29 NOTE — Telephone Encounter (Signed)
Reason for Disposition  [1] Blood glucose > 300 mg/dL (16.7 mmol/L) AND [2] does not  use insulin (e.g., not insulin-dependent; most people with type 2 diabetes)  [1] Blood glucose > 300 mg/dL (16.7 mmol/L) AND [2] two or more times in a row  Answer Assessment - Initial Assessment Questions 1. BLOOD GLUCOSE: "What is your blood glucose level?"      230 presently 2. ONSET: "When did you check the blood glucose?"     Keeping record 3. USUAL RANGE: "What is your glucose level usually?" (e.g., usual fasting morning value, usual evening value)     Saturday 0800 101   1400 120   1900 134. Sunday 183 in AM.  Yesterday Am 303  at HS 2235 402.. This AM 230 4. KETONES: "Do you check for ketones (urine or blood test strips)?" If Yes, ask: "What does the test show now?"      no 5. TYPE 1 or 2:  "Do you know what type of diabetes you have?"  (e.g., Type 1, Type 2, Gestational; doesn't know)       6. INSULIN: "Do you take insulin?" "What type of insulin(s) do you use? What is the mode of delivery? (syringe, pen; injection or pump)?"      no 7. DIABETES PILLS: "Do you take any pills for your diabetes?" If Yes, ask: "Have you missed taking any pills recently?"     Yes, no missed dose. 8. OTHER SYMPTOMS: "Do you have any symptoms?" (e.g., fever, frequent urination, difficulty breathing, dizziness, weakness, vomiting)     ED Friday, 411  Protocols used: Diabetes - High Blood Sugar-A-AH

## 2023-01-01 ENCOUNTER — Ambulatory Visit (INDEPENDENT_AMBULATORY_CARE_PROVIDER_SITE_OTHER): Payer: Medicare PPO | Admitting: Primary Care

## 2023-01-01 ENCOUNTER — Encounter (INDEPENDENT_AMBULATORY_CARE_PROVIDER_SITE_OTHER): Payer: Self-pay | Admitting: Primary Care

## 2023-01-01 VITALS — BP 124/81 | HR 96 | Resp 16 | Ht 67.0 in | Wt 231.8 lb

## 2023-01-01 DIAGNOSIS — E119 Type 2 diabetes mellitus without complications: Secondary | ICD-10-CM

## 2023-01-01 MED ORDER — ZOSTER VAC RECOMB ADJUVANTED 50 MCG/0.5ML IM SUSR: 0.5000 mL | Freq: Once | INTRAMUSCULAR | 0 refills | Status: AC

## 2023-01-01 NOTE — Progress Notes (Signed)
Subjective:  Patient ID: Amber Meyer, female    DOB: July 27, 1956  Age: 67 y.o. MRN: [id number]  CC: Diabetes and Hypertension   HPI Amber Meyer presents forFollow-up of diabetes. Patient does not check blood sugar at home  Compliant with meds - Yes Checking CBGs? Yes  Fasting avg - 71-303  Postprandial average -  Exercising regularly? - Yes Watching carbohydrate intake? - Yes Neuropathy ? - No Hypoglycemic events - No  - Recovers with :   Pertinent ROS:  Polyuria - No Polydipsia - Yes Vision problems - No Hypertension- Patient has No headache, No chest pain, No abdominal pain - No Nausea, No new weakness tingling or numbness, No Cough - shortness of breath  Medications as noted below. Taking them regularly without complication/adverse reaction being reported today.   History Amber Meyer has a past medical history of Allergy, Arthritis, Cataract, GERD (gastroesophageal reflux disease), Hyperlipidemia, Hypertension, MRSA infection, Renal disorder, Sinus bradycardia seen on cardiac monitor, Urgency of urination, and Urinary frequency.   She has a past surgical history that includes Abdominal hysterectomy (1981); Knee surgery (Right, 2013); Lithotripsy (2003, 2006); Kidney stone removal (Left, 1999); Knee Arthroplasty (Right, 12/23/2017); and Colonoscopy.   Her family history includes Alzheimer's disease in her father; Aneurysm in her mother.She reports that she has been smoking cigarettes. She has been smoking an average of .5 packs per day. She has been exposed to tobacco smoke. She has never used smokeless tobacco. She reports that she does not drink alcohol and does not use drugs.  Current Outpatient Medications on File Prior to Visit  Medication Sig Dispense Refill   albuterol (VENTOLIN HFA) 108 (90 Base) MCG/ACT inhaler INHALE 2 PUFFS EVERY 4 HOURS AS NEEDED FOR WHEEZING 1 each 3   amitriptyline (ELAVIL) 50 MG tablet Take 1 tablet (50 mg total) by mouth at bedtime.  90 tablet 1   amLODipine (NORVASC) 10 MG tablet TAKE 1 TABLET EVERY DAY (NEED MD APPOINTMENT) 90 tablet 1   aspirin 81 MG EC tablet Take 81 mg by mouth daily. Swallow whole.     atorvastatin (LIPITOR) 20 MG tablet Take 1 tablet (20 mg total) by mouth daily. 90 tablet 1   Blood Glucose Monitoring Suppl (TRUE METRIX METER) w/Device KIT Use as instructed. 1 kit 0   esomeprazole (NEXIUM) 40 MG capsule TAKE 1 CAPSULE BY MOUTH AT NOON 90 capsule 1   losartan-hydrochlorothiazide (HYZAAR) 100-25 MG tablet TAKE 1 TABLET EVERY DAY 90 tablet 1   metoprolol tartrate (LOPRESSOR) 25 MG tablet TAKE 1 TABLET TWICE DAILY 180 tablet 1   Multiple Vitamins-Calcium (ONE-A-DAY WOMENS PO) Take 1 tablet by mouth daily.     MYRBETRIQ 25 MG TB24 tablet TAKE 1 TABLET EVERY DAY 90 tablet 1   sitaGLIPtin-metformin (JANUMET) 50-1000 MG tablet Take 1 tablet by mouth 2 (two) times daily with a meal. 180 tablet 1   TRUE METRIX BLOOD GLUCOSE TEST test strip TEST BLOOD SUGAR EVERY DAY 100 strip 1   TRUEplus Lancets 30G MISC TEST BLOOD SUGAR EVERY DAY 100 each 11   No current facility-administered medications on file prior to visit.    ROS Comprehensive ROS Pertinent positive and negative noted in HPI    Objective:  Blood Pressure 124/81   Pulse 96   Respiration 16   Height '5\' 7"'$  (1.702 m)   Weight 231 lb 12.8 oz (105.1 kg)   Oxygen Saturation 97%   Body Mass Index 36.31 kg/m   BP Readings from Last 3 Encounters:  01/01/23 124/81  12/25/22 (Abnormal) 158/86  09/15/22 119/81    Wt Readings from Last 3 Encounters:  01/01/23 231 lb 12.8 oz (105.1 kg)  12/25/22 223 lb (101.2 kg)  09/15/22 228 lb 12.8 oz (103.8 kg)   Physical exam: General: Vital signs reviewed.  Patient is well-developed and well-nourished, obese female in no acute distress and cooperative with exam. Head: Normocephalic and atraumatic. Eyes: EOMI, conjunctivae normal, no scleral icterus. Neck: Supple, trachea midline, normal ROM, no JVD,  masses, thyromegaly, or carotid bruit present. Cardiovascular: RRR, S1 normal, S2 normal, no murmurs, gallops, or rubs. Pulmonary/Chest: Clear to auscultation bilaterally, no wheezes, rales, or rhonchi. Abdominal: Soft, non-tender, non-distended, BS +, no masses, organomegaly, or guarding present. Musculoskeletal: No joint deformities, erythema, or stiffness, ROM full and nontender. Extremities: No lower extremity edema bilaterally,  pulses symmetric and intact bilaterally. No cyanosis or clubbing. Neurological: A&O x3, Strength is normal Skin: Warm, dry and intact. No rashes or erythema. Psychiatric: Normal mood and affect. speech and behavior is normal. Cognition and memory are normal.     Lab Results  Component Value Date   HGBA1C 6.1 08/25/2022   HGBA1C 6.3 (A) 02/19/2022   HGBA1C 6.0 (A) 10/06/2021    Lab Results  Component Value Date   WBC 12.9 (H) 12/25/2022   HGB 15.0 12/25/2022   HCT 44.0 12/25/2022   PLT 448 (H) 12/25/2022   GLUCOSE 371 (H) 12/25/2022   CHOL 122 01/01/2023   TRIG 200 (H) 01/01/2023   HDL 53 01/01/2023   LDLCALC 37 01/01/2023   ALT 16 08/25/2022   AST 18 08/25/2022   NA 135 12/25/2022   K 4.9 12/25/2022   CL 96 (L) 12/25/2022   CREATININE 1.15 (H) 12/25/2022   BUN 21 12/25/2022   CO2 26 12/25/2022   TSH 1.160 07/03/2021   HGBA1C 6.1 08/25/2022     Assessment & Plan:   Amber Meyer was seen today for diabetes and hypertension.  Diagnoses and all orders for this visit:  Type 2 diabetes mellitus without complication, without long-term current use of insulin ([place]) - educated on lifestyle modifications, including but not limited to diet choices and adding exercise to daily routine.   -     Microalbumin / creatinine urine ratio -     Lipid Panel  Other orders -     Zoster Vaccine Adjuvanted [facility]) injection; Inject 0.5 mLs into the muscle once for 1 dose.   I have discontinued Amber Meyer Amber Meyer's benzonatate and guaiFENesin. I am also  having her start on Zoster Vaccine Adjuvanted. Additionally, I am having her maintain her Multiple Vitamins-Calcium (ONE-A-DAY WOMENS PO), True Metrix Meter, True Metrix Blood Glucose Test, aspirin EC, losartan-hydrochlorothiazide, metoprolol tartrate, amLODipine, Myrbetriq, TRUEplus Lancets 30G, esomeprazole, Janumet, amitriptyline, atorvastatin, and albuterol.  Meds ordered this encounter  Medications   Zoster Vaccine Adjuvanted [facility]) injection    Sig: Inject 0.5 mLs into the muscle once for 1 dose.    Dispense:  0.5 mL    Refill:  0     Follow-up:     The above assessment and management plan was discussed with the patient. The patient verbalized understanding of and has agreed to the management plan. Patient is aware to call the clinic if symptoms fail to improve or worsen. Patient is aware when to return to the clinic for a follow-up visit. Patient educated on when it is appropriate to go to the emergency department.   Juluis Mire, NP-C

## 2023-01-02 LAB — LIPID PANEL
Chol/HDL Ratio: 2.3 ratio (ref 0.0–4.4)
Cholesterol, Total: 122 mg/dL (ref 100–199)
HDL: 53 mg/dL (ref >39)
LDL Chol Calc (NIH): 37 mg/dL (ref 0–99)
Triglycerides: 200 mg/dL — ABNORMAL HIGH (ref 0–149)
VLDL Cholesterol Cal: 32 mg/dL (ref 5–40)

## 2023-01-02 LAB — MICROALBUMIN / CREATININE URINE RATIO
Creatinine, Urine: 98.6 mg/dL
Microalb/Creat Ratio: 9 mg/g creat (ref 0–29)
Microalbumin, Urine: 8.8 ug/mL

## 2023-01-04 ENCOUNTER — Encounter (INDEPENDENT_AMBULATORY_CARE_PROVIDER_SITE_OTHER): Payer: Self-pay | Admitting: Primary Care

## 2023-01-04 ENCOUNTER — Other Ambulatory Visit (INDEPENDENT_AMBULATORY_CARE_PROVIDER_SITE_OTHER): Payer: Self-pay | Admitting: Primary Care

## 2023-01-05 NOTE — Telephone Encounter (Signed)
Refilled 08/20/2022 #90 1 rf Requested Prescriptions  Pending Prescriptions Disp Refills   esomeprazole (NEXIUM) 40 MG capsule [Pharmacy Med Name: ESOMEPRAZOLE MAGNESIUM 40 MG Capsule Delayed Release] 90 capsule 3    Sig: TAKE 1 CAPSULE BY MOUTH AT [place]     Gastroenterology: Proton Pump Inhibitors 2 Passed - 01/04/2023  3:30 AM      Passed - ALT in normal range and within 360 days    ALT  Date Value Ref Range Status  08/25/2022 16 0 - 32 IU/L Final         Passed - AST in normal range and within 360 days    AST  Date Value Ref Range Status  08/25/2022 18 0 - 40 IU/L Final         Passed - Valid encounter within last 12 months    Recent Outpatient Visits           4 days ago Type 2 diabetes mellitus without complication, without long-term current use of insulin ([place])   [place] Kerin Perna, NP   3 months ago Hearing loss of left ear due to cerumen impaction   [place], Michelle P, NP   4 months ago Type 2 diabetes mellitus without complication, without long-term current use of insulin ([place])   [place], Michelle P, NP   10 months ago Type 2 diabetes mellitus without complication, without long-term current use of insulin ([place])   [place] Kerin Perna, NP   1 year ago Medication refill   [place], [place], NP       Future Appointments             In 1 month Oletta Lamas, [place] Cage, NP [place]

## 2023-01-06 NOTE — Telephone Encounter (Signed)
Spoke with pt about denial of refill due to being too soon / pt stated she never received initial refill in Sept 2023 from [facility] and only received a refill on 11.15.23/ [facility] advised me they sent her a 90 day supply on 9.9.23 and the next 90 supply on 11.15.23/ pt should have medication left but the pt doesn't / pt asked if refill can be sent to [facility] for Esomeprazole and she will ask pharmacy how much she will have to pay out of pocket / please advise

## 2023-01-06 NOTE — Addendum Note (Signed)
Addended by: Matilde Sprang on: 01/06/2023 05:07 PM   Modules accepted: Orders

## 2023-01-07 MED ORDER — ESOMEPRAZOLE MAGNESIUM 40 MG PO CPDR: DELAYED_RELEASE_CAPSULE | ORAL | 1 refills | Status: DC

## 2023-01-07 NOTE — Telephone Encounter (Signed)
Will forward to provider  

## 2023-01-07 NOTE — Telephone Encounter (Signed)
Requested medications are due for refill today.  no  Requested medications are on the active medications list.  yes  Last refill. 08/20/2022 #90 1 rf  Future visit scheduled.   yes  Notes to clinic.  Please review for refill.    Requested Prescriptions  Pending Prescriptions Disp Refills   esomeprazole (NEXIUM) 40 MG capsule 90 capsule 1    Sig: TAKE 1 CAPSULE BY MOUTH AT [place]     Gastroenterology: Proton Pump Inhibitors 2 Passed - 01/06/2023  5:07 PM      Passed - ALT in normal range and within 360 days    ALT  Date Value Ref Range Status  08/25/2022 16 0 - 32 IU/L Final         Passed - AST in normal range and within 360 days    AST  Date Value Ref Range Status  08/25/2022 18 0 - 40 IU/L Final         Passed - Valid encounter within last 12 months    Recent Outpatient Visits           6 days ago Type 2 diabetes mellitus without complication, without long-term current use of insulin ([place])   [place] Kerin Perna, NP   3 months ago Hearing loss of left ear due to cerumen impaction   [place], Michelle P, NP   4 months ago Type 2 diabetes mellitus without complication, without long-term current use of insulin ([place])   [place], Michelle P, NP   10 months ago Type 2 diabetes mellitus without complication, without long-term current use of insulin ([place])   [place] Kerin Perna, NP   1 year ago Medication refill   [place], [place], NP       Future Appointments             In 1 month [place], [place] Cage, NP [place]            Refused Prescriptions Disp Refills   esomeprazole ([place]) 40 MG capsule [Pharmacy Med Name: ESOMEPRAZOLE MAGNESIUM 40 MG Capsule Delayed Release] 90 capsule 3    Sig: TAKE 1 CAPSULE BY MOUTH AT [place]      Gastroenterology: Proton Pump Inhibitors 2 Passed - 01/06/2023  5:07 PM      Passed - ALT in normal range and within 360 days    ALT  Date Value Ref Range Status  08/25/2022 16 0 - 32 IU/L Final         Passed - AST in normal range and within 360 days    AST  Date Value Ref Range Status  08/25/2022 18 0 - 40 IU/L Final         Passed - Valid encounter within last 12 months    Recent Outpatient Visits           6 days ago Type 2 diabetes mellitus without complication, without long-term current use of insulin ([place])   [place] Kerin Perna, NP   3 months ago Hearing loss of left ear due to cerumen impaction   [place] [facility] Kerin Perna, NP   4 months ago Type 2 diabetes mellitus without complication, without long-term current use of insulin [facility])   [place] Kerin Perna, NP   10 months ago Type  2 diabetes mellitus without complication, without long-term current use of insulin [facility])   [place], Michelle P, NP   1 year ago Medication refill   [place], [place], NP       Future Appointments             In 1 month Oletta Lamas, [place] Cage, NP [place]

## 2023-01-20 ENCOUNTER — Other Ambulatory Visit (INDEPENDENT_AMBULATORY_CARE_PROVIDER_SITE_OTHER): Payer: Self-pay | Admitting: Primary Care

## 2023-01-20 DIAGNOSIS — E119 Type 2 diabetes mellitus without complications: Secondary | ICD-10-CM

## 2023-01-20 DIAGNOSIS — Z760 Encounter for issue of repeat prescription: Secondary | ICD-10-CM

## 2023-01-28 ENCOUNTER — Other Ambulatory Visit (INDEPENDENT_AMBULATORY_CARE_PROVIDER_SITE_OTHER): Payer: Self-pay | Admitting: Primary Care

## 2023-01-28 DIAGNOSIS — Z760 Encounter for issue of repeat prescription: Secondary | ICD-10-CM

## 2023-01-28 DIAGNOSIS — G4700 Insomnia, unspecified: Secondary | ICD-10-CM

## 2023-01-28 NOTE — Telephone Encounter (Signed)
Will forward to provider  

## 2023-02-12 ENCOUNTER — Ambulatory Visit (INDEPENDENT_AMBULATORY_CARE_PROVIDER_SITE_OTHER): Payer: Medicare HMO | Admitting: Primary Care

## 2023-02-26 ENCOUNTER — Encounter (INDEPENDENT_AMBULATORY_CARE_PROVIDER_SITE_OTHER): Payer: Self-pay | Admitting: Primary Care

## 2023-02-26 ENCOUNTER — Ambulatory Visit (INDEPENDENT_AMBULATORY_CARE_PROVIDER_SITE_OTHER): Payer: Medicare HMO | Admitting: Primary Care

## 2023-02-26 VITALS — BP 120/81 | HR 87 | Resp 16 | Wt 233.4 lb

## 2023-02-26 DIAGNOSIS — Z760 Encounter for issue of repeat prescription: Secondary | ICD-10-CM | POA: Diagnosis not present

## 2023-02-26 DIAGNOSIS — E119 Type 2 diabetes mellitus without complications: Secondary | ICD-10-CM | POA: Diagnosis not present

## 2023-02-26 DIAGNOSIS — I10 Essential (primary) hypertension: Secondary | ICD-10-CM | POA: Diagnosis not present

## 2023-02-26 LAB — POCT GLYCOSYLATED HEMOGLOBIN (HGB A1C): HbA1c, POC (controlled diabetic range): 6.6 % (ref 0.0–7.0)

## 2023-03-05 ENCOUNTER — Ambulatory Visit (INDEPENDENT_AMBULATORY_CARE_PROVIDER_SITE_OTHER): Payer: Medicare HMO | Admitting: Primary Care

## 2023-03-05 DIAGNOSIS — Z0000 Encounter for general adult medical examination without abnormal findings: Secondary | ICD-10-CM | POA: Diagnosis not present

## 2023-03-05 NOTE — Progress Notes (Unsigned)
Subjective:   Amber Meyer is a 67 y.o. female who presents for Medicare Annual (Subsequent) preventive examination.   I connected with Latreshia Lagattuta on 03/05/2023 by telephone and verified that I am speaking with the correct person using two identifiers.   I discussed the limitations, risks, security and privacy concerns of performing an evaluation and management service by telephone and the availability of in person appointments. I also discussed with the patient that there may be a patient responsible charge related to this service. The patient expressed understanding and verbally consented to this telephonic visit.   Location of Patient: Home Location of Provider: Office     Review of Systems    Defer to PCP       Objective:    There were no vitals filed for this visit. There is no height or weight on file to calculate BMI.     03/05/2023   11:19 AM 08/09/2021    9:07 AM 06/20/2020    9:24 AM 09/21/2019    9:09 PM 12/23/2017    3:29 PM 12/17/2017    9:47 AM 01/16/2015    8:28 AM  Advanced Directives  Does Patient Have a Medical Advance Directive? No No No No No No No  Would patient like information on creating a medical advance directive? No - Patient declined No - Patient declined No - Patient declined  No - Patient declined No - Patient declined No - patient declined information    Current Medications (verified) Outpatient Encounter Medications as of 03/05/2023  Medication Sig   albuterol (VENTOLIN HFA) 108 (90 Base) MCG/ACT inhaler INHALE 2 PUFFS EVERY 4 HOURS AS NEEDED FOR WHEEZING   amitriptyline (ELAVIL) 50 MG tablet TAKE 1 TABLET AT BEDTIME   amLODipine (NORVASC) 10 MG tablet TAKE 1 TABLET EVERY DAY (NEED MD APPOINTMENT)   aspirin 81 MG EC tablet Take 81 mg by mouth daily. Swallow whole.   atorvastatin (LIPITOR) 20 MG tablet Take 1 tablet (20 mg total) by mouth daily.   Blood Glucose Monitoring Suppl (TRUE METRIX METER) w/Device KIT Use as instructed.    esomeprazole (NEXIUM) 40 MG capsule TAKE 1 CAPSULE BY MOUTH AT NOON   losartan-hydrochlorothiazide (HYZAAR) 100-25 MG tablet TAKE 1 TABLET EVERY DAY   metoprolol tartrate (LOPRESSOR) 25 MG tablet TAKE 1 TABLET TWICE DAILY   Multiple Vitamins-Calcium (ONE-A-DAY WOMENS PO) Take 1 tablet by mouth daily.   MYRBETRIQ 25 MG TB24 tablet TAKE 1 TABLET EVERY DAY   sitaGLIPtin-metformin (JANUMET) 50-1000 MG tablet Take 1 tablet by mouth 2 (two) times daily with a meal.   TRUE METRIX BLOOD GLUCOSE TEST test strip TEST BLOOD SUGAR EVERY DAY   TRUEplus Lancets 30G MISC TEST BLOOD SUGAR EVERY DAY   No facility-administered encounter medications on file as of 03/05/2023.    Allergies (verified) Hydrocodone-acetaminophen   History: Past Medical History:  Diagnosis Date   Allergy    seasonal   Arthritis    Cataract    right    GERD (gastroesophageal reflux disease)    Hyperlipidemia    Hypertension    MRSA infection    2019   Renal disorder    3 kidney stones   Sinus bradycardia seen on cardiac monitor    Urgency of urination    Urinary frequency    Past Surgical History:  Procedure Laterality Date   ABDOMINAL HYSTERECTOMY  1981   partial   COLONOSCOPY     Kidney stone removal Left 1999   KNEE ARTHROPLASTY  Right 12/23/2017   Procedure: RIGHT TOTAL KNEE ARTHROPLASTY WITH COMPUTER NAVIGATION;  Surgeon: Rod Can, MD;  Location: [facility];  Service: Orthopedics;  Laterality: Right;  NEEDS RNFA   KNEE SURGERY Right 2013   cleaned out damaged tissue and arthritis   LITHOTRIPSY  2003, 2006   x 2   Family History  Problem Relation Age of Onset   Aneurysm Mother    Alzheimer's disease Father    Colon polyps Neg Hx    Colon cancer Neg Hx    Esophageal cancer Neg Hx    Stomach cancer Neg Hx    Rectal cancer Neg Hx    Social History   Socioeconomic History   Marital status: Single    Spouse name: Not on file   Number of children: Not on file   Years of education: Not on file    Highest education level: Not on file  Occupational History   Occupation: retired  Tobacco Use   Smoking status: Every Day    Packs/day: .5    Types: Cigarettes    Passive exposure: Current   Smokeless tobacco: Never   Tobacco comments:    down to 2 cigs a day   Vaping Use   Vaping Use: Never used  Substance and Sexual Activity   Alcohol use: No   Drug use: No   Sexual activity: Not Currently  Other Topics Concern   Not on file  Social History Narrative   Not on file   Social Determinants of Health   Financial Resource Strain: Not on file  Food Insecurity: Not on file  Transportation Needs: Not on file  Physical Activity: Not on file  Stress: Not on file  Social Connections: Not on file    Tobacco Counseling Ready to quit: Not Answered Counseling given: Not Answered Tobacco comments: down to 2 cigs a day    Clinical Intake:     Pain : No/denies pain     Diabetes: Yes CBG done?: No     Diabetic?yes          Activities of Daily Living    03/05/2023   11:19 AM  In your present state of health, do you have any difficulty performing the following activities:  Hearing? 0  Vision? 0  Difficulty concentrating or making decisions? 0  Walking or climbing stairs? 0  Dressing or bathing? 0  Doing errands, shopping? 0  Preparing Food and eating ? N  Using the Toilet? N  In the past six months, have you accidently leaked urine? Y  Do you have problems with loss of bowel control? N  Managing your Medications? N  Managing your Finances? N  Housekeeping or managing your Housekeeping? N    Patient Care Team: Kerin Perna, NP as PCP - General (Internal Medicine)  Indicate any recent Medical Services you may have received from other than [facility] providers in the past year (date may be approximate).     Assessment:   This is a routine wellness examination for Amber Meyer.  Hearing/Vision screen No results found.  Dietary issues and exercise  activities discussed:     Goals Addressed   None   Depression Screen    03/05/2023   11:19 AM 02/26/2023    9:31 AM 08/25/2022    9:26 AM 02/19/2022    3:39 PM 10/06/2021    8:37 AM 08/09/2021    9:08 AM 07/03/2021    8:31 AM  PHQ 2/9 Scores  PHQ - 2 Score 0  1 2 0 0 0 0  PHQ- 9 Score   8        Fall Risk    03/05/2023   11:18 AM 02/26/2023    9:31 AM 09/15/2022    9:01 AM 08/25/2022    9:25 AM 02/19/2022    3:39 PM  [place] in the past year? 1 1 0 0 0  Number falls in past yr: 1 1 0 0   Injury with Fall? 0 0 0 0   Risk for fall due to :   No Fall Risks No Fall Risks     FALL RISK PREVENTION PERTAINING TO THE HOME:  Any stairs in or around the home? No  If so, are there any without handrails? No  Home free of loose throw rugs in walkways, pet beds, electrical cords, etc? Yes  Adequate lighting in your home to reduce risk of falls? Yes   ASSISTIVE DEVICES UTILIZED TO PREVENT FALLS:  Life alert? No  Use of a cane, walker or w/c? No  Grab bars in the bathroom? No  Shower chair or bench in shower? No  Elevated toilet seat or a handicapped toilet? No   TIMED UP AND GO:  Was the test performed? No .  Length of time to ambulate 10 feet: Pt states it would take her a min or half to walk 10 feet    Gait steady and fast without use of assistive device  Cognitive Function:    03/05/2023   11:19 AM  MMSE - Mini Mental State Exam  Orientation to time 5  Orientation to Place 5  Registration 3  Attention/ Calculation 5  Recall 2  Language- name 2 objects 2  Language- repeat 1  Language- follow 3 step command 3  Language- read & follow direction 1  Write a sentence 1  Copy design 1  Total score 29        08/09/2021    9:11 AM  6CIT Screen  What Year? 4 points  What month? 3 points  What time? 3 points  Count back from 20 0 points  Months in reverse 0 points  Repeat phrase 0 points  Total Score 10 points    Immunizations Immunization History   Administered Date(s) Administered   Influenza,inj,Quad PF,6+ Mos 09/28/2019, 10/28/2020, 10/06/2021, 08/25/2022   Influenza,inj,quad, With Preservative 11/15/2017   Influenza-Unspecified 09/13/2016   PFIZER Comirnaty(Gray Top)Covid-19 Tri-Sucrose Vaccine 03/22/2021   PFIZER(Purple Top)SARS-COV-2 Vaccination 01/20/2020, 02/10/2020, 09/10/2020, 09/27/2021   Pneumococcal Conjugate-13 10/28/2020   Pneumococcal Polysaccharide-23 06/02/2019   Tdap 06/02/2019   Zoster Recombinat (Shingrix) 10/06/2021    TDAP status: Up to date  Flu Vaccine status: Up to date  Pneumococcal vaccine status: Up to date  Covid-19 vaccine status: Completed vaccines  Qualifies for Shingles Vaccine? Yes   Zostavax completed No   Shingrix Completed?: No.    Education has been provided regarding the importance of this vaccine. Patient has been advised to call insurance company to determine out of pocket expense if they have not yet received this vaccine. Advised may also receive vaccine at local pharmacy or Health Dept. Verbalized acceptance and understanding.   Pt has received 1 Vaccine for shingles. Pt will go to outside pharmacy to receive last one   Screening Tests Health Maintenance  Topic Date Due   Zoster Vaccines- Shingrix (2 of 2) 12/01/2021   COVID-19 Vaccine (6 - 2023-24 season) 08/14/2022   Diabetic kidney evaluation - eGFR measurement  12/26/2023  Diabetic kidney evaluation - Urine ACR  01/02/2024   Medicare Annual Wellness (AWV)  03/04/2024   MAMMOGRAM  03/10/2024   Pneumonia Vaccine 23+ Years old (3 of 3 - PPSV23 or PCV20) 06/01/2024   DTaP/Tdap/Td (2 - Td or Tdap) 06/01/2029   COLONOSCOPY (Pts 45-70yrs Insurance coverage will need to be confirmed)  03/05/2032   INFLUENZA VACCINE  Completed   DEXA SCAN  Completed   Hepatitis C Screening  Completed   HPV VACCINES  Aged Out    Health Maintenance  Health Maintenance Due  Topic Date Due   Zoster Vaccines- Shingrix (2 of 2) 12/01/2021    COVID-19 Vaccine (6 - 2023-24 season) 08/14/2022    Colorectal cancer screening: Type of screening: Colonoscopy. Completed 3/29/223. Repeat every 10 years  Mammogram status: Completed 03/10/2022. Repeat every year. Pt will repeat mammogram every 2 years instead of 1 year   Bone Density status: Completed 08/28/2021. Results reflect: Bone density results: OSTEOPOROSIS. Repeat every 2 years.  Lung Cancer Screening: (Low Dose CT Chest recommended if Age 42-80 years, 30 pack-year currently smoking OR have quit w/in 15years.) does qualify.   Lung Cancer Screening Referral: Will defer to provider to place order   Additional Screening:  Hepatitis C Screening: does qualify; Completed 10/01/2021  Vision Screening: Recommended annual ophthalmology exams for early detection of glaucoma and other disorders of the eye. Is the patient up to date with their annual eye exam?  Yes  Who is the provider or what is the name of the office in which the patient attends annual eye exams? Dr. Gershon Crane If pt is not established with a provider, would they like to be referred to a provider to establish care? No .   Dental Screening: Recommended annual dental exams for proper oral hygiene  Community Resource Referral / Chronic Care Management: CRR required this visit?  No   CCM required this visit?  No      Plan:     I have personally reviewed and noted the following in the patient's chart:   Medical and social history Use of alcohol, tobacco or illicit drugs  Current medications and supplements including opioid prescriptions. Patient is not currently taking opioid prescriptions. Functional ability and status Nutritional status Physical activity Advanced directives List of other physicians Hospitalizations, surgeries, and ER visits in previous 12 months Vitals Screenings to include cognitive, depression, and falls Referrals and appointments  In addition, I have reviewed and discussed with patient  certain preventive protocols, quality metrics, and best practice recommendations. A written personalized care plan for preventive services as well as general preventive health recommendations were provided to patient.     Verdell Carmine, RMA   03/05/2023   Nurse Notes: Non face to face 6 minutes.

## 2023-03-07 MED ORDER — LOSARTAN POTASSIUM-HCTZ 100-25 MG PO TABS: 1.0000 | ORAL_TABLET | Freq: Every day | ORAL | 1 refills | Status: DC

## 2023-03-07 MED ORDER — METOPROLOL TARTRATE 25 MG PO TABS: 25.0000 mg | ORAL_TABLET | Freq: Two times a day (BID) | ORAL | 1 refills | Status: DC

## 2023-03-07 MED ORDER — JANUMET 50-1000 MG PO TABS: 1.0000 | ORAL_TABLET | Freq: Two times a day (BID) | ORAL | 1 refills | Status: DC

## 2023-03-07 MED ORDER — ATORVASTATIN CALCIUM 20 MG PO TABS: 20.0000 mg | ORAL_TABLET | Freq: Every day | ORAL | 1 refills | Status: DC

## 2023-03-07 NOTE — Progress Notes (Signed)
Subjective:  Patient ID: Amber Meyer, female    DOB: 22-Mar-1956  Age: 67 y.o. MRN: [id number]  CC: Hemoglobin A1c Screening (A1C due )   HPI Amber Meyer presents forFollow-up of diabetes. Patient does not check blood sugar at home  Compliant with meds - Yes Checking CBGs? No  Fasting avg -   Postprandial average -  Exercising regularly? - Yes Watching carbohydrate intake? - Yes Neuropathy ? - No Hypoglycemic events - No  - Recovers with :   Pertinent ROS:  Polyuria - No Polydipsia - No Vision problems - No  Medications as noted below. Taking them regularly without complication/adverse reaction being reported today.   History Amber Meyer has a past medical history of Allergy, Arthritis, Cataract, GERD (gastroesophageal reflux disease), Hyperlipidemia, Hypertension, MRSA infection, Renal disorder, Sinus bradycardia seen on cardiac monitor, Urgency of urination, and Urinary frequency.   She has a past surgical history that includes Abdominal hysterectomy (1981); Knee surgery (Right, 2013); Lithotripsy (2003, 2006); Kidney stone removal (Left, 1999); Knee Arthroplasty (Right, 12/23/2017); and Colonoscopy.   Her family history includes Alzheimer's disease in her father; Aneurysm in her mother.She reports that she has been smoking cigarettes. She has been smoking an average of .5 packs per day. She has been exposed to tobacco smoke. She has never used smokeless tobacco. She reports that she does not drink alcohol and does not use drugs.  Current Outpatient Medications on File Prior to Visit  Medication Sig Dispense Refill   albuterol (VENTOLIN HFA) 108 (90 Base) MCG/ACT inhaler INHALE 2 PUFFS EVERY 4 HOURS AS NEEDED FOR WHEEZING 1 each 3   amitriptyline (ELAVIL) 50 MG tablet TAKE 1 TABLET AT BEDTIME 90 tablet 1   aspirin 81 MG EC tablet Take 81 mg by mouth daily. Swallow whole.     Blood Glucose Monitoring Suppl (TRUE METRIX METER) w/Device KIT Use as instructed. 1 kit 0    esomeprazole (NEXIUM) 40 MG capsule TAKE 1 CAPSULE BY MOUTH AT NOON 90 capsule 1   Multiple Vitamins-Calcium (ONE-A-DAY WOMENS PO) Take 1 tablet by mouth daily.     MYRBETRIQ 25 MG TB24 tablet TAKE 1 TABLET EVERY DAY 90 tablet 1   TRUE METRIX BLOOD GLUCOSE TEST test strip TEST BLOOD SUGAR EVERY DAY 100 strip 1   TRUEplus Lancets 30G MISC TEST BLOOD SUGAR EVERY DAY 100 each 11   No current facility-administered medications on file prior to visit.    ROS Comprehensive ROS Pertinent positive and negative noted in HPI    Objective:  Blood Pressure 120/81   Pulse 87   Respiration 16   Weight 233 lb 6.4 oz (105.9 kg)   Oxygen Saturation 97%   Body Mass Index 36.56 kg/m   BP Readings from Last 3 Encounters:  02/26/23 120/81  01/01/23 124/81  12/25/22 (Abnormal) 158/86    Wt Readings from Last 3 Encounters:  02/26/23 233 lb 6.4 oz (105.9 kg)  01/01/23 231 lb 12.8 oz (105.1 kg)  12/25/22 223 lb (101.2 kg)    Physical Exam Vitals reviewed.  Constitutional:      Appearance: She is obese.  HENT:     Head: Normocephalic.     Right Ear: External ear normal.     Left Ear: External ear normal.     Nose: Nose normal.  Eyes:     Extraocular Movements: Extraocular movements intact.  Cardiovascular:     Rate and Rhythm: Normal rate and regular rhythm.  Pulmonary:     Effort: Pulmonary  effort is normal.     Breath sounds: Normal breath sounds.  Abdominal:     General: Bowel sounds are normal. There is distension.     Palpations: Abdomen is soft.  Musculoskeletal:        General: Normal range of motion.     Cervical back: Normal range of motion and neck supple.  Skin:    General: Skin is warm and dry.  Neurological:     Mental Status: She is alert and oriented to person, place, and time.  Psychiatric:        Mood and Affect: Mood normal.        Behavior: Behavior normal.    Lab Results  Component Value Date   HGBA1C 6.6 02/26/2023   HGBA1C 6.1 08/25/2022   HGBA1C 6.3  (A) 02/19/2022    Lab Results  Component Value Date   WBC 12.9 (H) 12/25/2022   HGB 15.0 12/25/2022   HCT 44.0 12/25/2022   PLT 448 (H) 12/25/2022   GLUCOSE 371 (H) 12/25/2022   CHOL 122 01/01/2023   TRIG 200 (H) 01/01/2023   HDL 53 01/01/2023   LDLCALC 37 01/01/2023   ALT 16 08/25/2022   AST 18 08/25/2022   NA 135 12/25/2022   K 4.9 12/25/2022   CL 96 (L) 12/25/2022   CREATININE 1.15 (H) 12/25/2022   BUN 21 12/25/2022   CO2 26 12/25/2022   TSH 1.160 07/03/2021   HGBA1C 6.6 02/26/2023     Assessment & Plan:  Amber Meyer was seen today for hemoglobin a1c screening.  Diagnoses and all orders for this visit:  Type 2 diabetes mellitus without complication, without long-term current use of insulin (HCC) -     POCT glycosylated hemoglobin (Hb A1C) 6.6 - educated on lifestyle modifications, including but not limited to diet choices and adding exercise to daily routine.   -     sitaGLIPtin-metformin (JANUMET) 50-1000 MG tablet; Take 1 tablet by mouth 2 (two) times daily with a meal.  Medication refill -     atorvastatin (LIPITOR) 20 MG tablet; Take 1 tablet (20 mg total) by mouth daily. -     losartan-hydrochlorothiazide (HYZAAR) 100-25 MG tablet; Take 1 tablet by mouth daily. -     metoprolol tartrate (LOPRESSOR) 25 MG tablet; Take 1 tablet (25 mg total) by mouth 2 (two) times daily. -     sitaGLIPtin-metformin (JANUMET) 50-1000 MG tablet; Take 1 tablet by mouth 2 (two) times daily with a meal.  Essential hypertension BP goal - <140/90 Explained that having normal blood pressure is the goal and medications are helping to get to goal and maintain normal blood pressure. DIET: Limit salt intake, read nutrition labels to check salt content, limit fried and high fatty foods  Avoid using multisymptom OTC cold preparations that generally contain sudafed which can rise BP. Consult with pharmacist on best cold relief products to use for persons with HTN EXERCISE Discussed incorporating  exercise such as walking - 30 minutes most days of the week and can do in 10 minute intervals    -     losartan-hydrochlorothiazide (HYZAAR) 100-25 MG tablet; Take 1 tablet by mouth daily. -     metoprolol tartrate (LOPRESSOR) 25 MG tablet; Take 1 tablet (25 mg total) by mouth 2 (two) times daily.     I have discontinued Amber Meyer's amLODipine. I have also changed her losartan-hydrochlorothiazide and metoprolol tartrate. Additionally, I am having her maintain her Multiple Vitamins-Calcium (ONE-A-DAY WOMENS PO), True Metrix Meter, True  Metrix Blood Glucose Test, aspirin EC, Myrbetriq, TRUEplus Lancets 30G, albuterol, esomeprazole, amitriptyline, atorvastatin, and Janumet.  Meds ordered this encounter  Medications   atorvastatin (LIPITOR) 20 MG tablet    Sig: Take 1 tablet (20 mg total) by mouth daily.    Dispense:  90 tablet    Refill:  1    Order Specific Question:   Supervising Provider    Answer:   Tresa Garter [id number]   losartan-hydrochlorothiazide (HYZAAR) 100-25 MG tablet    Sig: Take 1 tablet by mouth daily.    Dispense:  90 tablet    Refill:  1    Order Specific Question:   Supervising Provider    Answer:   Tresa Garter [id number]   metoprolol tartrate (LOPRESSOR) 25 MG tablet    Sig: Take 1 tablet (25 mg total) by mouth 2 (two) times daily.    Dispense:  180 tablet    Refill:  1    Order Specific Question:   Supervising Provider    Answer:   Tresa Garter [id number]   sitaGLIPtin-metformin (JANUMET) 50-1000 MG tablet    Sig: Take 1 tablet by mouth 2 (two) times daily with a meal.    Dispense:  180 tablet    Refill:  1    Order Specific Question:   Supervising Provider    AnswerTresa Garter [id number]     Follow-up:   No follow-ups on file.  The above assessment and management plan was discussed with the patient. The patient verbalized understanding of and has agreed to the management plan. Patient is aware to call the clinic  if symptoms fail to improve or worsen. Patient is aware when to return to the clinic for a follow-up visit. Patient educated on when it is appropriate to go to the emergency department.   Juluis Mire, NP-C

## 2023-03-10 ENCOUNTER — Other Ambulatory Visit (INDEPENDENT_AMBULATORY_CARE_PROVIDER_SITE_OTHER): Payer: Self-pay | Admitting: Primary Care

## 2023-03-19 ENCOUNTER — Other Ambulatory Visit (INDEPENDENT_AMBULATORY_CARE_PROVIDER_SITE_OTHER): Payer: Self-pay | Admitting: Primary Care

## 2023-03-19 DIAGNOSIS — N3281 Overactive bladder: Secondary | ICD-10-CM

## 2023-03-19 NOTE — Telephone Encounter (Signed)
Requested Prescriptions  Pending Prescriptions Disp Refills   MYRBETRIQ 25 MG TB24 tablet [Pharmacy Med Name: MYRBETRIQ 25 MG Tablet Extended Release 24 Hour] 90 tablet 0    Sig: TAKE 1 TABLET EVERY DAY     Urology: Bladder Agents - mirabegron Failed - 03/19/2023  2:34 AM      Failed - Cr in normal range and within 360 days    Creat  Date Value Ref Range Status  01/16/2014 0.83 0.50 - 1.10 mg/dL Final   Creatinine, Ser  Date Value Ref Range Status  12/25/2022 1.15 (H) 0.44 - 1.00 mg/dL Final         Passed - ALT in normal range and within 360 days    ALT  Date Value Ref Range Status  08/25/2022 16 0 - 32 IU/L Final         Passed - AST in normal range and within 360 days    AST  Date Value Ref Range Status  08/25/2022 18 0 - 40 IU/L Final         Passed - eGFR is 15 or above and within 360 days    GFR calc Af Amer  Date Value Ref Range Status  10/28/2020 68 >59 mL/min/1.73 Final    Comment:    **In accordance with recommendations from the NKF-ASN Task force,**   [facility] is in the process of updating its eGFR calculation to the   2021 CKD-EPI creatinine equation that estimates kidney function   without a race variable.    GFR, Estimated  Date Value Ref Range Status  12/25/2022 52 (L) >60 mL/min Final    Comment:    (NOTE) Calculated using the CKD-EPI Creatinine Equation (2021)    eGFR  Date Value Ref Range Status  08/25/2022 67 >59 mL/min/1.73 Final         Passed - Last BP in normal range    BP Readings from Last 1 Encounters:  02/26/23 120/81         Passed - Valid encounter within last 12 months    Recent Outpatient Visits           2 weeks ago Encounter for Medicare annual wellness exam   [place] [facility] Grayce Sessions, NP   3 weeks ago Type 2 diabetes mellitus without complication, without long-term current use of insulin (HCC)   [place] [facility] Grayce Sessions, NP   2 months ago Type 2  diabetes mellitus without complication, without long-term current use of insulin (HCC)   [place] [facility] Grayce Sessions, NP   6 months ago Hearing loss of left ear due to cerumen impaction   [place] [facility] Grayce Sessions, NP   6 months ago Type 2 diabetes mellitus without complication, without long-term current use of insulin [facility])   [place] [facility] Grayce Sessions, NP       Future Appointments             In 3 months Randa Evens, Kinnie Scales, NP [place] [facility]

## 2023-04-03 ENCOUNTER — Other Ambulatory Visit (INDEPENDENT_AMBULATORY_CARE_PROVIDER_SITE_OTHER): Payer: Self-pay | Admitting: Primary Care

## 2023-04-03 DIAGNOSIS — Z760 Encounter for issue of repeat prescription: Secondary | ICD-10-CM

## 2023-04-05 NOTE — Telephone Encounter (Signed)
Refilled 03/07/23 #90 with 1 refill. Requested Prescriptions  Refused Prescriptions Disp Refills   atorvastatin (LIPITOR) 20 MG tablet [Pharmacy Med Name: ATORVASTATIN CALCIUM 20 MG Tablet] 90 tablet 3    Sig: TAKE 1 TABLET (20 MG TOTAL) BY MOUTH DAILY.     Cardiovascular:  Antilipid - Statins Failed - 04/03/2023  3:31 AM      Failed - Lipid Panel in normal range within the last 12 months    Cholesterol, Total  Date Value Ref Range Status  01/01/2023 122 100 - 199 mg/dL Final   LDL Chol Calc (NIH)  Date Value Ref Range Status  01/01/2023 37 0 - 99 mg/dL Final   HDL  Date Value Ref Range Status  01/01/2023 53 >39 mg/dL Final   Triglycerides  Date Value Ref Range Status  01/01/2023 200 (H) 0 - 149 mg/dL Final         Passed - Patient is not pregnant      Passed - Valid encounter within last 12 months    Recent Outpatient Visits           1 month ago Encounter for [employer] annual wellness exam   [place] [facility] Grayce Sessions, NP   1 month ago Type 2 diabetes mellitus without complication, without long-term current use of insulin (HCC)   [place] [facility] Grayce Sessions, NP   3 months ago Type 2 diabetes mellitus without complication, without long-term current use of insulin (HCC)   [place] [facility] Grayce Sessions, NP   6 months ago Hearing loss of left ear due to cerumen impaction   [place] [facility] Grayce Sessions, NP   7 months ago Type 2 diabetes mellitus without complication, without long-term current use of insulin [facility])   [place] [facility] Grayce Sessions, NP       Future Appointments             In 2 months Randa Evens, Kinnie Scales, NP [place] [facility]

## 2023-05-19 ENCOUNTER — Other Ambulatory Visit (INDEPENDENT_AMBULATORY_CARE_PROVIDER_SITE_OTHER): Payer: Self-pay | Admitting: Primary Care

## 2023-05-19 DIAGNOSIS — Z760 Encounter for issue of repeat prescription: Secondary | ICD-10-CM

## 2023-05-19 DIAGNOSIS — J9801 Acute bronchospasm: Secondary | ICD-10-CM

## 2023-05-19 DIAGNOSIS — I10 Essential (primary) hypertension: Secondary | ICD-10-CM

## 2023-05-19 NOTE — Telephone Encounter (Signed)
Medication Refill - Medication: amLODipine (NORVASC) 10 MG tablet [[id number]]    albuterol (VENTOLIN HFA) 108 (90 Base) MCG/ACT inhaler [[id number]]    Has the patient contacted their pharmacy? [facility] - [place], [place] - [address]  [address] [place] [address]  Phone: [phone] Fax: [phone]  Hours: Not open 24 hours     Has the patient been seen for an appointment in the last year OR does the patient have an upcoming appointment? Yes.    Agent: Please be advised that RX refills may take up to 3 business days. We ask that you follow-up with your pharmacy.

## 2023-05-20 MED ORDER — ALBUTEROL SULFATE HFA 108 (90 BASE) MCG/ACT IN AERS: 2.0000 | INHALATION_SPRAY | RESPIRATORY_TRACT | 3 refills | Status: DC | PRN

## 2023-05-20 NOTE — Telephone Encounter (Signed)
Unable to refill per protocol, Rx expired. Amlodipine discontinued.  Requested Prescriptions  Pending Prescriptions Disp Refills   albuterol (VENTOLIN HFA) 108 (90 Base) MCG/ACT inhaler 1 each 3    Sig: Inhale 2 puffs into the lungs every 4 (four) hours as needed. for wheezing     Pulmonology:  Beta Agonists 2 Passed - 05/19/2023  3:44 PM      Passed - Last BP in normal range    BP Readings from Last 1 Encounters:  02/26/23 120/81         Passed - Last Heart Rate in normal range    Pulse Readings from Last 1 Encounters:  02/26/23 87         Passed - Valid encounter within last 12 months    Recent Outpatient Visits           2 months ago Encounter for [employer] annual wellness exam   [place] [facility] Grayce Sessions, NP   2 months ago Type 2 diabetes mellitus without complication, without long-term current use of insulin (HCC)   [place] [facility] Grayce Sessions, NP   4 months ago Type 2 diabetes mellitus without complication, without long-term current use of insulin (HCC)   [place] [facility] Grayce Sessions, NP   8 months ago Hearing loss of left ear due to cerumen impaction   [place] [facility] Grayce Sessions, NP   8 months ago Type 2 diabetes mellitus without complication, without long-term current use of insulin (HCC)   [place] [facility] Grayce Sessions, NP       Future Appointments             In 1 month Randa Evens, Kinnie Scales, NP [place] [facility]             amLODipine (NORVASC) 10 MG tablet 90 tablet 1    Sig: TAKE 1 TABLET EVERY DAY (NEED MD APPOINTMENT)     Cardiovascular: Calcium Channel Blockers 2 Passed - 05/19/2023  3:44 PM      Passed - Last BP in normal range    BP Readings from Last 1 Encounters:  02/26/23 120/81         Passed - Last Heart Rate in normal range    Pulse Readings from Last 1 Encounters:   02/26/23 87         Passed - Valid encounter within last 6 months    Recent Outpatient Visits           2 months ago Encounter for [employer] annual wellness exam   [place] [facility] Grayce Sessions, NP   2 months ago Type 2 diabetes mellitus without complication, without long-term current use of insulin (HCC)   [place] [facility] Grayce Sessions, NP   4 months ago Type 2 diabetes mellitus without complication, without long-term current use of insulin (HCC)   [place] [facility] Grayce Sessions, NP   8 months ago Hearing loss of left ear due to cerumen impaction    [facility] Grayce Sessions, NP   8 months ago Type 2 diabetes mellitus without complication, without long-term current use of insulin [facility])    [facility] Grayce Sessions, NP       Future Appointments             In 1 month Gwinda Passe  P, NP [place] [facility]

## 2023-05-31 ENCOUNTER — Other Ambulatory Visit (INDEPENDENT_AMBULATORY_CARE_PROVIDER_SITE_OTHER): Payer: Self-pay | Admitting: Primary Care

## 2023-05-31 DIAGNOSIS — N3281 Overactive bladder: Secondary | ICD-10-CM

## 2023-05-31 NOTE — Telephone Encounter (Signed)
Requested Prescriptions  Pending Prescriptions Disp Refills   esomeprazole (NEXIUM) 40 MG capsule [Pharmacy Med Name: ESOMEPRAZOLE MAGNESIUM 40 MG Capsule Delayed Release] 90 capsule 1    Sig: TAKE 1 CAPSULE BY MOUTH AT NOON     Gastroenterology: Proton Pump Inhibitors 2 Passed - 05/31/2023  4:09 AM      Passed - ALT in normal range and within 360 days    ALT  Date Value Ref Range Status  08/25/2022 16 0 - 32 IU/L Final         Passed - AST in normal range and within 360 days    AST  Date Value Ref Range Status  08/25/2022 18 0 - 40 IU/L Final         Passed - Valid encounter within last 12 months    Recent Outpatient Visits           2 months ago Encounter for Medicare annual wellness exam   [place] [facility] Grayce Sessions, NP   3 months ago Type 2 diabetes mellitus without complication, without long-term current use of insulin (HCC)   [place] [facility] Grayce Sessions, NP   5 months ago Type 2 diabetes mellitus without complication, without long-term current use of insulin (HCC)   [place] [facility] Grayce Sessions, NP   8 months ago Hearing loss of left ear due to cerumen impaction   [place] [facility] Grayce Sessions, NP   9 months ago Type 2 diabetes mellitus without complication, without long-term current use of insulin (HCC)   [place] [facility] Grayce Sessions, NP       Future Appointments             In 1 month Edwards, Kinnie Scales, NP [place] [facility]             mirabegron ER (MYRBETRIQ) 25 MG TB24 tablet [Pharmacy Med Name: MIRABEGRON ER 25 MG Tablet Extended Release 24 Hour] 90 tablet 1    Sig: TAKE 1 TABLET EVERY DAY     Urology: Bladder Agents - mirabegron Failed - 05/31/2023  4:09 AM      Failed - Cr in normal range and within 360 days    Creat  Date Value Ref Range Status  01/16/2014 0.83 0.50 - 1.10  mg/dL Final   Creatinine, Ser  Date Value Ref Range Status  12/25/2022 1.15 (H) 0.44 - 1.00 mg/dL Final         Passed - ALT in normal range and within 360 days    ALT  Date Value Ref Range Status  08/25/2022 16 0 - 32 IU/L Final         Passed - AST in normal range and within 360 days    AST  Date Value Ref Range Status  08/25/2022 18 0 - 40 IU/L Final         Passed - eGFR is 15 or above and within 360 days    GFR calc Af Amer  Date Value Ref Range Status  10/28/2020 68 >59 mL/min/1.73 Final    Comment:    **In accordance with recommendations from the NKF-ASN Task force,**   [facility] is in the process of updating its eGFR calculation to the   2021 CKD-EPI creatinine equation that estimates kidney function   without a race variable.    GFR, Estimated  Date Value Ref Range Status  12/25/2022 52 (L) >  60 mL/min Final    Comment:    (NOTE) Calculated using the CKD-EPI Creatinine Equation (2021)    eGFR  Date Value Ref Range Status  08/25/2022 67 >59 mL/min/1.73 Final         Passed - Last BP in normal range    BP Readings from Last 1 Encounters:  02/26/23 120/81         Passed - Valid encounter within last 12 months    Recent Outpatient Visits           2 months ago Encounter for Medicare annual wellness exam   [place] [facility] Grayce Sessions, NP   3 months ago Type 2 diabetes mellitus without complication, without long-term current use of insulin (HCC)   [place] [facility] Grayce Sessions, NP   5 months ago Type 2 diabetes mellitus without complication, without long-term current use of insulin (HCC)   [place] [facility] Grayce Sessions, NP   8 months ago Hearing loss of left ear due to cerumen impaction   [place] [facility] Grayce Sessions, NP   9 months ago Type 2 diabetes mellitus without complication, without long-term current use of insulin [facility])   [facility] Grayce Sessions, NP       Future Appointments             In 1 month Randa Evens, Kinnie Scales, NP [place] [facility]

## 2023-06-23 ENCOUNTER — Other Ambulatory Visit (INDEPENDENT_AMBULATORY_CARE_PROVIDER_SITE_OTHER): Payer: Self-pay | Admitting: Primary Care

## 2023-06-23 DIAGNOSIS — G4700 Insomnia, unspecified: Secondary | ICD-10-CM

## 2023-06-23 DIAGNOSIS — Z760 Encounter for issue of repeat prescription: Secondary | ICD-10-CM

## 2023-06-23 NOTE — Telephone Encounter (Signed)
Requested Prescriptions  Pending Prescriptions Disp Refills   amitriptyline (ELAVIL) 50 MG tablet [Pharmacy Med Name: AMITRIPTYLINE HYDROCHLORIDE 50 MG Tablet] 90 tablet 0    Sig: TAKE 1 TABLET AT BEDTIME     Psychiatry:  Antidepressants - Heterocyclics (TCAs) Passed - 06/23/2023  3:19 AM      Passed - Valid encounter within last 6 months    Recent Outpatient Visits           3 months ago Encounter for Medicare annual wellness exam   [place] [facility] Grayce Sessions, NP   3 months ago Type 2 diabetes mellitus without complication, without long-term current use of insulin (HCC)   [place] [facility] Grayce Sessions, NP   5 months ago Type 2 diabetes mellitus without complication, without long-term current use of insulin (HCC)   [place] [facility] Grayce Sessions, NP   9 months ago Hearing loss of left ear due to cerumen impaction   [place] [facility] Grayce Sessions, NP   10 months ago Type 2 diabetes mellitus without complication, without long-term current use of insulin [facility])   [place] [facility] Grayce Sessions, NP       Future Appointments             In 1 week Randa Evens, Kinnie Scales, NP [place] [facility]

## 2023-07-01 ENCOUNTER — Ambulatory Visit (INDEPENDENT_AMBULATORY_CARE_PROVIDER_SITE_OTHER): Payer: Self-pay | Admitting: *Deleted

## 2023-07-01 ENCOUNTER — Encounter (INDEPENDENT_AMBULATORY_CARE_PROVIDER_SITE_OTHER): Payer: Self-pay | Admitting: Primary Care

## 2023-07-01 NOTE — Telephone Encounter (Signed)
ummary: diarrhea   Pt called n with diarrhea     Called patient [phone] to review sx of diarrhea. No answer from patient. LVMCTB [phone].

## 2023-07-01 NOTE — Telephone Encounter (Signed)
Second attempt to call patient- left message on VM to call office 

## 2023-07-01 NOTE — Telephone Encounter (Signed)
Chief Complaint: diarrhea x 7 or more times day Symptoms: watery stools 7 times in 24 hours. Can eat and drink but has diarrhea atleast 30-45 minutes after eating. Tolerated drinking Pedialyte. Reports nephew visiting and had stomach virus.  Frequency: 3 days  Pertinent Negatives: Patient denies fever, no dizziness no abdominal pain no dry mouth more than usual.  Disposition: [] ED /[] Urgent Care (no appt availability in office) / [x] Appointment(In office/virtual)/ []  [facility]/ [] Home Care/ [] Refused Recommended Disposition /[]  Mobile Bus/ []  Follow-up with PCP Additional Notes:   Patient reports she changed her appt for tomorrow due to possible stomach virus to 07/21/23. Recommended patient with worsening sx go to UC or call back. Please advise if imodium recommended. Hx diabetic    Reason for Disposition  [1] SEVERE diarrhea (e.g., 7 or more times / day more than normal) AND [2] present > 24 hours (1 day)  Answer Assessment - Initial Assessment Questions 1. DIARRHEA SEVERITY: "How bad is the diarrhea?" "How many more stools have you had in the past 24 hours than normal?"    - NO DIARRHEA (SCALE 0)   - MILD (SCALE 1-3): Few loose or mushy BMs; increase of 1-3 stools over normal daily number of stools; mild increase in ostomy output.   -  MODERATE (SCALE 4-7): Increase of 4-6 stools daily over normal; moderate increase in ostomy output.   -  SEVERE (SCALE 8-10; OR "WORST POSSIBLE"): Increase of 7 or more stools daily over normal; moderate increase in ostomy output; incontinence.     Severe more than 8 times  2. ONSET: "When did the diarrhea begin?"      3 days 3. BM CONSISTENCY: "How loose or watery is the diarrhea?"      Watery  4. VOMITING: "Are you also vomiting?" If Yes, ask: "How many times in the past 24 hours?"      no 5. ABDOMEN PAIN: "Are you having any abdomen pain?" If Yes, ask: "What does it feel like?" (e.g., crampy, dull, intermittent, constant)       No  6. ABDOMEN PAIN SEVERITY: If present, ask: "How bad is the pain?"  (e.g., Scale 1-10; mild, moderate, or severe)   - MILD (1-3): doesn't interfere with normal activities, abdomen soft and not tender to touch    - MODERATE (4-7): interferes with normal activities or awakens from sleep, abdomen tender to touch    - SEVERE (8-10): excruciating pain, doubled over, unable to do any normal activities       Denies  7. ORAL INTAKE: If vomiting, "Have you been able to drink liquids?" "How much liquids have you had in the past 24 hours?"     na 8. HYDRATION: "Any signs of dehydration?" (e.g., dry mouth [not just dry lips], too weak to stand, dizziness, new weight loss) "When did you last urinate?"     Not more than usual dry mouth and dizziness. 9. EXPOSURE: "Have you traveled to a foreign country recently?" "Have you been exposed to anyone with diarrhea?" "Could you have eaten any food that was spoiled?"     No  10. ANTIBIOTIC USE: "Are you taking antibiotics now or have you taken antibiotics in the past 2 months?"       no 11. OTHER SYMPTOMS: "Do you have any other symptoms?" (e.g., fever, blood in stool)       Watery stools. Has BM approx 30-45 minutes after eating . Nephew visiting and had stomach virus recently  12. PREGNANCY: "  Is there any chance you are pregnant?" "When was your last menstrual period?"       na  Protocols used: [facility]

## 2023-07-02 ENCOUNTER — Ambulatory Visit (INDEPENDENT_AMBULATORY_CARE_PROVIDER_SITE_OTHER): Payer: Medicare HMO | Admitting: Primary Care

## 2023-07-05 NOTE — Telephone Encounter (Signed)
Returned pt call. Pt states her Diarrhea comes and goes but it's not as bad as last week. Pt states she is going to start taking alka seltzer because the imodium didn't help!. Pt has been scheduled an appt for 7/25

## 2023-07-08 ENCOUNTER — Ambulatory Visit (INDEPENDENT_AMBULATORY_CARE_PROVIDER_SITE_OTHER): Payer: Medicare HMO

## 2023-07-21 ENCOUNTER — Ambulatory Visit (INDEPENDENT_AMBULATORY_CARE_PROVIDER_SITE_OTHER): Payer: Medicare HMO | Admitting: Primary Care

## 2023-07-22 ENCOUNTER — Telehealth (INDEPENDENT_AMBULATORY_CARE_PROVIDER_SITE_OTHER): Payer: Self-pay | Admitting: Primary Care

## 2023-07-22 ENCOUNTER — Ambulatory Visit (INDEPENDENT_AMBULATORY_CARE_PROVIDER_SITE_OTHER): Payer: Medicare HMO | Admitting: Primary Care

## 2023-07-22 NOTE — Telephone Encounter (Signed)
Pt spoke to and said [facility] told her the office was closed. I am not sure who they spoke with but noone has called the office to ask if we were open today. Not sure why pt was told this.

## 2023-07-28 ENCOUNTER — Other Ambulatory Visit (INDEPENDENT_AMBULATORY_CARE_PROVIDER_SITE_OTHER): Payer: Self-pay | Admitting: Primary Care

## 2023-07-28 DIAGNOSIS — Z760 Encounter for issue of repeat prescription: Secondary | ICD-10-CM

## 2023-07-28 DIAGNOSIS — I10 Essential (primary) hypertension: Secondary | ICD-10-CM

## 2023-07-28 DIAGNOSIS — E119 Type 2 diabetes mellitus without complications: Secondary | ICD-10-CM

## 2023-07-28 NOTE — Telephone Encounter (Signed)
Labs and  appt.

## 2023-08-12 ENCOUNTER — Ambulatory Visit (INDEPENDENT_AMBULATORY_CARE_PROVIDER_SITE_OTHER): Payer: Medicare HMO | Admitting: Primary Care

## 2023-08-12 ENCOUNTER — Encounter (INDEPENDENT_AMBULATORY_CARE_PROVIDER_SITE_OTHER): Payer: Self-pay | Admitting: Primary Care

## 2023-08-12 VITALS — BP 142/78 | HR 99 | Resp 16 | Ht 67.0 in | Wt 234.8 lb

## 2023-08-12 DIAGNOSIS — Z7984 Long term (current) use of oral hypoglycemic drugs: Secondary | ICD-10-CM | POA: Diagnosis not present

## 2023-08-12 DIAGNOSIS — J9801 Acute bronchospasm: Secondary | ICD-10-CM

## 2023-08-12 DIAGNOSIS — Z760 Encounter for issue of repeat prescription: Secondary | ICD-10-CM

## 2023-08-12 DIAGNOSIS — Z23 Encounter for immunization: Secondary | ICD-10-CM

## 2023-08-12 DIAGNOSIS — E119 Type 2 diabetes mellitus without complications: Secondary | ICD-10-CM | POA: Diagnosis not present

## 2023-08-12 DIAGNOSIS — G4700 Insomnia, unspecified: Secondary | ICD-10-CM

## 2023-08-12 DIAGNOSIS — E669 Obesity, unspecified: Secondary | ICD-10-CM | POA: Diagnosis not present

## 2023-08-12 DIAGNOSIS — Z6836 Body mass index (BMI) 36.0-36.9, adult: Secondary | ICD-10-CM

## 2023-08-12 DIAGNOSIS — I10 Essential (primary) hypertension: Secondary | ICD-10-CM

## 2023-08-12 LAB — POCT GLYCOSYLATED HEMOGLOBIN (HGB A1C): HbA1c, POC (controlled diabetic range): 6.1 % (ref 0.0–7.0)

## 2023-08-12 MED ORDER — AMITRIPTYLINE HCL 50 MG PO TABS
50.0000 mg | ORAL_TABLET | Freq: Every day | ORAL | 1 refills | Status: DC
Start: 2023-08-12 — End: 2024-02-22

## 2023-08-12 MED ORDER — LOSARTAN POTASSIUM-HCTZ 100-25 MG PO TABS
1.0000 | ORAL_TABLET | Freq: Every day | ORAL | 1 refills | Status: DC
Start: 2023-08-12 — End: 2024-02-22

## 2023-08-12 MED ORDER — JANUMET 50-1000 MG PO TABS
1.0000 | ORAL_TABLET | Freq: Two times a day (BID) | ORAL | 1 refills | Status: DC
Start: 2023-08-12 — End: 2024-02-22

## 2023-08-12 MED ORDER — ALBUTEROL SULFATE HFA 108 (90 BASE) MCG/ACT IN AERS: 2.0000 | INHALATION_SPRAY | RESPIRATORY_TRACT | 3 refills | Status: DC | PRN

## 2023-08-12 MED ORDER — METOPROLOL TARTRATE 25 MG PO TABS
25.0000 mg | ORAL_TABLET | Freq: Two times a day (BID) | ORAL | 1 refills | Status: DC
Start: 2023-08-12 — End: 2024-02-22

## 2023-08-12 MED ORDER — ATORVASTATIN CALCIUM 20 MG PO TABS
20.0000 mg | ORAL_TABLET | Freq: Every day | ORAL | 1 refills | Status: DC
Start: 2023-08-12 — End: 2024-02-22

## 2023-08-12 NOTE — Patient Instructions (Signed)

## 2023-08-12 NOTE — Progress Notes (Signed)
Subjective:  Patient ID: Amber Meyer, female    DOB: 1956/07/23  Age: 67 y.o. MRN: [id number]  CC: Diabetes   HPI Amber Meyer presents forFollow-up of diabetes. Patient does check blood sugar at home. Management of HTN- Patient has, No chest pain, No abdominal pain - No Nausea, No new weakness tingling or numbness, No Cough - shortness of breath . She c/o vision changes and was told by now retired Dr.Shapiro she need a cataract remove right eye.  Compliant with meds - Yes Checking CBGs? Yes  Fasting avg - 88-130  Postprandial average -  Exercising regularly? - No Watching carbohydrate intake? - Yes Neuropathy ? - No Hypoglycemic events - No  - Recovers with :   Pertinent ROS:  Polyuria - No Polydipsia - No Vision problems - No  Medications as noted below. Taking them regularly without complication/adverse reaction being reported today.   History Amber Meyer has a past medical history of Allergy, Arthritis, Cataract, GERD (gastroesophageal reflux disease), Hyperlipidemia, Hypertension, MRSA infection, Renal disorder, Sinus bradycardia seen on cardiac monitor, Urgency of urination, and Urinary frequency.   She has a past surgical history that includes Abdominal hysterectomy (1981); Knee surgery (Right, 2013); Lithotripsy (2003, 2006); Kidney stone removal (Left, 1999); Knee Arthroplasty (Right, 12/23/2017); and Colonoscopy.   Her family history includes Alzheimer's disease in her father; Aneurysm in her mother.She reports that she has been smoking cigarettes. She has been exposed to tobacco smoke. She has never used smokeless tobacco. She reports that she does not drink alcohol and does not use drugs.  Current Outpatient Medications on File Prior to Visit  Medication Sig Dispense Refill   aspirin 81 MG EC tablet Take 81 mg by mouth daily. Swallow whole.     Blood Glucose Monitoring Suppl (TRUE METRIX METER) w/Device KIT Use as instructed. 1 kit 0   esomeprazole  (NEXIUM) 40 MG capsule TAKE 1 CAPSULE BY MOUTH AT NOON 90 capsule 1   mirabegron ER (MYRBETRIQ) 25 MG TB24 tablet TAKE 1 TABLET EVERY DAY 90 tablet 1   Multiple Vitamins-Calcium (ONE-A-DAY WOMENS PO) Take 1 tablet by mouth daily.     TRUE METRIX BLOOD GLUCOSE TEST test strip TEST BLOOD SUGAR EVERY DAY 100 strip 1   TRUEplus Lancets 30G MISC TEST BLOOD SUGAR EVERY DAY 100 each 11   No current facility-administered medications on file prior to visit.    ROS Comprehensive ROS Pertinent positive and negative noted in HPI    Objective:  BP (!) 142/78 (BP Location: Right Arm)   Pulse 99   Resp 16   Ht 5\' 7"  (1.702 m)   Wt 234 lb 12.8 oz (106.5 kg)   SpO2 97%   BMI 36.77 kg/m   BP Readings from Last 3 Encounters:  08/12/23 (!) 142/78  02/26/23 120/81  01/01/23 124/81    Wt Readings from Last 3 Encounters:  08/12/23 234 lb 12.8 oz (106.5 kg)  02/26/23 233 lb 6.4 oz (105.9 kg)  01/01/23 231 lb 12.8 oz (105.1 kg)    Physical Exam Vitals reviewed.  Constitutional:      Appearance: She is obese.  HENT:     Head: Normocephalic.     Right Ear: Tympanic membrane and external ear normal.     Left Ear: Tympanic membrane and external ear normal.  Eyes:     Extraocular Movements: Extraocular movements intact.  Cardiovascular:     Rate and Rhythm: Normal rate and regular rhythm.  Pulmonary:     Effort:  Pulmonary effort is normal.     Breath sounds: Normal breath sounds.  Abdominal:     General: Bowel sounds are normal. There is distension.     Palpations: Abdomen is soft.  Musculoskeletal:        General: Normal range of motion.     Cervical back: Normal range of motion and neck supple.  Skin:    General: Skin is warm and dry.  Neurological:     Mental Status: She is alert and oriented to person, place, and time.  Psychiatric:        Mood and Affect: Mood normal.        Behavior: Behavior normal.   Lab Results  Component Value Date   HGBA1C 6.1 08/12/2023   HGBA1C 6.6  02/26/2023   HGBA1C 6.1 08/25/2022    Lab Results  Component Value Date   WBC 12.9 (H) 12/25/2022   HGB 15.0 12/25/2022   HCT 44.0 12/25/2022   PLT 448 (H) 12/25/2022   GLUCOSE 371 (H) 12/25/2022   CHOL 122 01/01/2023   TRIG 200 (H) 01/01/2023   HDL 53 01/01/2023   LDLCALC 37 01/01/2023   ALT 16 08/25/2022   AST 18 08/25/2022   NA 135 12/25/2022   K 4.9 12/25/2022   CL 96 (L) 12/25/2022   CREATININE 1.15 (H) 12/25/2022   BUN 21 12/25/2022   CO2 26 12/25/2022   TSH 1.160 07/03/2021   HGBA1C 6.1 08/12/2023     Assessment & Plan:  Amber Meyer was seen today for diabetes.  Diagnoses and all orders for this visit:  Type 2 diabetes mellitus without complication, without long-term current use of insulin (HCC) - educated on lifestyle modifications, including but not limited to diet choices and adding exercise to daily routine.   -     POCT glycosylated hemoglobin (Hb A1C) 6.1 -     sitaGLIPtin-metformin (JANUMET) 50-1000 MG tablet; Take 1 tablet by mouth 2 (two) times daily with a meal.  Flu vaccine need -     Flu Vaccine Trivalent High Dose (Fluad)  Medication refill -     losartan-hydrochlorothiazide (HYZAAR) 100-25 MG tablet; Take 1 tablet by mouth daily. -     metoprolol tartrate (LOPRESSOR) 25 MG tablet; Take 1 tablet (25 mg total) by mouth 2 (two) times daily. -     sitaGLIPtin-metformin (JANUMET) 50-1000 MG tablet; Take 1 tablet by mouth 2 (two) times daily with a meal. -     amitriptyline (ELAVIL) 50 MG tablet; Take 1 tablet (50 mg total) by mouth at bedtime. -     atorvastatin (LIPITOR) 20 MG tablet; Take 1 tablet (20 mg total) by mouth daily.  Essential hypertension BP goal - < 140/90 Explained that having normal blood pressure is the goal and medications are helping to get to goal and maintain normal blood pressure. DIET: Limit salt intake, read nutrition labels to check salt content, limit fried and high fatty foods  Avoid using multisymptom OTC cold preparations  that generally contain sudafed which can rise BP. Consult with pharmacist on best cold relief products to use for persons with HTN EXERCISE Discussed incorporating exercise such as walking - 30 minutes most days of the week and can do in 10 minute intervals    -     losartan-hydrochlorothiazide (HYZAAR) 100-25 MG tablet; Take 1 tablet by mouth daily. -     metoprolol tartrate (LOPRESSOR) 25 MG tablet; Take 1 tablet (25 mg total) by mouth 2 (two) times daily.  Insomnia, unspecified  type -     amitriptyline (ELAVIL) 50 MG tablet; Take 1 tablet (50 mg total) by mouth at bedtime.  Bronchospasm -     albuterol (VENTOLIN HFA) 108 (90 Base) MCG/ACT inhaler; Inhale 2 puffs into the lungs every 4 (four) hours as needed. for wheezing    I have changed Amber Meyer's losartan-hydrochlorothiazide, metoprolol tartrate, Janumet, and amitriptyline. I am also having her maintain her Multiple Vitamins-Calcium (ONE-A-DAY WOMENS PO), True Metrix Meter, True Metrix Blood Glucose Test, aspirin EC, TRUEplus Lancets 30G, esomeprazole, mirabegron ER, atorvastatin, and albuterol.  Meds ordered this encounter  Medications   losartan-hydrochlorothiazide (HYZAAR) 100-25 MG tablet    Sig: Take 1 tablet by mouth daily.    Dispense:  90 tablet    Refill:  1    Order Specific Question:   Supervising Provider    Answer:   Quentin Angst [id number]   metoprolol tartrate (LOPRESSOR) 25 MG tablet    Sig: Take 1 tablet (25 mg total) by mouth 2 (two) times daily.    Dispense:  180 tablet    Refill:  1    Order Specific Question:   Supervising Provider    Answer:   Quentin Angst [[id number]]   sitaGLIPtin-metformin (JANUMET) 50-1000 MG tablet    Sig: Take 1 tablet by mouth 2 (two) times daily with a meal.    Dispense:  180 tablet    Refill:  1    Order Specific Question:   Supervising Provider    Answer:   Quentin Angst [[id number]]   amitriptyline (ELAVIL) 50 MG tablet    Sig: Take 1 tablet (50 mg  total) by mouth at bedtime.    Dispense:  90 tablet    Refill:  1    Order Specific Question:   Supervising Provider    Answer:   Quentin Angst [[id number]]   atorvastatin (LIPITOR) 20 MG tablet    Sig: Take 1 tablet (20 mg total) by mouth daily.    Dispense:  90 tablet    Refill:  1    Order Specific Question:   Supervising Provider    Answer:   Quentin Angst [[id number]]   albuterol (VENTOLIN HFA) 108 (90 Base) MCG/ACT inhaler    Sig: Inhale 2 puffs into the lungs every 4 (four) hours as needed. for wheezing    Dispense:  51 each    Refill:  3    Order Specific Question:   Supervising Provider    Answer:   Quentin Angst [[id number]]     Follow-up:   Return in about 3 months (around 11/12/2023) for medical conditions fasting.  The above assessment and management plan was discussed with the patient. The patient verbalized understanding of and has agreed to the management plan. Patient is aware to call the clinic if symptoms fail to improve or worsen. Patient is aware when to return to the clinic for a follow-up visit. Patient educated on when it is appropriate to go to the emergency department.   Gwinda Passe, NP-C

## 2023-08-22 ENCOUNTER — Other Ambulatory Visit (INDEPENDENT_AMBULATORY_CARE_PROVIDER_SITE_OTHER): Payer: Self-pay | Admitting: Primary Care

## 2023-08-22 DIAGNOSIS — Z760 Encounter for issue of repeat prescription: Secondary | ICD-10-CM

## 2023-09-04 ENCOUNTER — Other Ambulatory Visit (INDEPENDENT_AMBULATORY_CARE_PROVIDER_SITE_OTHER): Payer: Self-pay | Admitting: Primary Care

## 2023-09-04 DIAGNOSIS — G4700 Insomnia, unspecified: Secondary | ICD-10-CM

## 2023-09-04 DIAGNOSIS — Z760 Encounter for issue of repeat prescription: Secondary | ICD-10-CM

## 2023-09-06 NOTE — Telephone Encounter (Signed)
Refused Elavil refill request because it was sent 08/12/2023 with #90 with one refill given.

## 2023-09-09 ENCOUNTER — Telehealth (INDEPENDENT_AMBULATORY_CARE_PROVIDER_SITE_OTHER): Payer: Self-pay | Admitting: Primary Care

## 2023-09-09 NOTE — Telephone Encounter (Signed)
Copied from CRM [id number]. Topic: General - Inquiry >> Sep 09, 2023  2:33 PM Marlow Baars wrote: Reason for CRM: The patient called in stating she spoke with [facility] and they told her they were faxing over an order to her provider so she can get a new continuous glucose monitor as her stopped working. Please assist patient further letting her know if the correspondence and order was received.

## 2023-09-13 NOTE — Telephone Encounter (Signed)
Haven't received an order.

## 2023-09-16 NOTE — Telephone Encounter (Signed)
Noted  

## 2023-09-16 NOTE — Telephone Encounter (Signed)
Patient has called back to check on the request for a new Glucose Monitor as patient's monitor has stopped working. Patient advised the order was not received from [facility] via fax and patient states she will contact [facility] to re-fax this order.

## 2023-09-21 NOTE — Telephone Encounter (Signed)
Patient called back to check on the status of this request. Patient states she received a letter from [facility] saying PCP needs to fax them an order for Glucose Monitor.  [facility] Fax # [phone] [facility] Phone # [phone]   Please follow back up with patient. Patients callback # [phone]

## 2023-09-22 ENCOUNTER — Other Ambulatory Visit: Payer: Self-pay | Admitting: Pharmacist

## 2023-09-22 MED ORDER — ACCU-CHEK GUIDE VI STRP: ORAL_STRIP | 6 refills | Status: AC

## 2023-09-22 MED ORDER — ACCU-CHEK SOFTCLIX LANCETS MISC: 6 refills | Status: AC

## 2023-09-22 MED ORDER — ACCU-CHEK GUIDE W/DEVICE KIT: PACK | 0 refills | Status: AC

## 2023-09-22 NOTE — Telephone Encounter (Signed)
Glucometer has been sent to pharmacy no fax from [facility] has been received

## 2023-10-25 ENCOUNTER — Other Ambulatory Visit (INDEPENDENT_AMBULATORY_CARE_PROVIDER_SITE_OTHER): Payer: Self-pay | Admitting: Primary Care

## 2023-10-25 DIAGNOSIS — N3281 Overactive bladder: Secondary | ICD-10-CM

## 2023-10-25 NOTE — Telephone Encounter (Signed)
Will forward to provider  

## 2023-10-27 ENCOUNTER — Other Ambulatory Visit (INDEPENDENT_AMBULATORY_CARE_PROVIDER_SITE_OTHER): Payer: Self-pay | Admitting: Primary Care

## 2023-10-27 DIAGNOSIS — N3281 Overactive bladder: Secondary | ICD-10-CM

## 2023-10-27 MED ORDER — MIRABEGRON ER 25 MG PO TB24
25.0000 mg | ORAL_TABLET | Freq: Every day | ORAL | 1 refills | Status: DC
Start: 2023-10-27 — End: 2023-11-24

## 2023-11-15 ENCOUNTER — Telehealth (INDEPENDENT_AMBULATORY_CARE_PROVIDER_SITE_OTHER): Payer: Self-pay | Admitting: Primary Care

## 2023-11-15 NOTE — Telephone Encounter (Signed)
Called pt to remind them of apt. Will be present.

## 2023-11-16 ENCOUNTER — Ambulatory Visit (INDEPENDENT_AMBULATORY_CARE_PROVIDER_SITE_OTHER): Payer: Medicare HMO | Admitting: Primary Care

## 2023-11-22 ENCOUNTER — Other Ambulatory Visit (INDEPENDENT_AMBULATORY_CARE_PROVIDER_SITE_OTHER): Payer: Self-pay | Admitting: Primary Care

## 2023-11-23 NOTE — Telephone Encounter (Signed)
Requested by interface surescripts. Future visit tomorrow.  Requested Prescriptions  Pending Prescriptions Disp Refills   esomeprazole (NEXIUM) 40 MG capsule [Pharmacy Med Name: Esomeprazole Magnesium Oral Capsule Delayed Release 40 MG] 90 capsule 2    Sig: TAKE 1 CAPSULE AT NOON     Gastroenterology: Proton Pump Inhibitors 2 Failed - 11/22/2023 12:48 PM      Failed - ALT in normal range and within 360 days    ALT  Date Value Ref Range Status  08/25/2022 16 0 - 32 IU/L Final         Failed - AST in normal range and within 360 days    AST  Date Value Ref Range Status  08/25/2022 18 0 - 40 IU/L Final         Passed - Valid encounter within last 12 months    Recent Outpatient Visits           3 months ago Type 2 diabetes mellitus without complication, without long-term current use of insulin (HCC)   [place] [facility] Grayce Sessions, NP   8 months ago Encounter for [employer] annual wellness exam   [place] [facility] Grayce Sessions, NP   9 months ago Type 2 diabetes mellitus without complication, without long-term current use of insulin (HCC)   [place] [facility] Grayce Sessions, NP   10 months ago Type 2 diabetes mellitus without complication, without long-term current use of insulin (HCC)   [place] [facility] Grayce Sessions, NP   1 year ago Hearing loss of left ear due to cerumen impaction   [place] [facility] Grayce Sessions, NP       Future Appointments             Tomorrow Grayce Sessions, NP [place] [facility]

## 2023-11-24 ENCOUNTER — Encounter (INDEPENDENT_AMBULATORY_CARE_PROVIDER_SITE_OTHER): Payer: Self-pay | Admitting: Primary Care

## 2023-11-24 ENCOUNTER — Ambulatory Visit (INDEPENDENT_AMBULATORY_CARE_PROVIDER_SITE_OTHER): Payer: Medicare HMO | Admitting: Primary Care

## 2023-11-24 VITALS — BP 144/88 | HR 104 | Resp 16 | Wt 230.6 lb

## 2023-11-24 DIAGNOSIS — I10 Essential (primary) hypertension: Secondary | ICD-10-CM | POA: Diagnosis not present

## 2023-11-24 DIAGNOSIS — E119 Type 2 diabetes mellitus without complications: Secondary | ICD-10-CM

## 2023-11-24 DIAGNOSIS — N3281 Overactive bladder: Secondary | ICD-10-CM

## 2023-11-24 DIAGNOSIS — Z7984 Long term (current) use of oral hypoglycemic drugs: Secondary | ICD-10-CM | POA: Diagnosis not present

## 2023-11-24 DIAGNOSIS — Z5941 Food insecurity: Secondary | ICD-10-CM

## 2023-11-24 MED ORDER — MIRABEGRON ER 50 MG PO TB24: 50.0000 mg | ORAL_TABLET | Freq: Every day | ORAL | 1 refills | Status: DC

## 2023-11-24 NOTE — Progress Notes (Signed)
[facility]  Amber Meyer, is a 67 y.o. female  PIR:[id number]  YTK:[id number]  DOB - 05/18/1956  No chief complaint on file.      Subjective:   Amber Meyer is a 67 y.o. female here today for a follow up visit. HTN, Patient has No headache, No chest pain, No abdominal pain - No Nausea, No new weakness tingling or numbness, No Cough - shortness of breath. Prediabetes-Denies polyuria, polydipsia, polyphasia or vision changes.  Does not check blood sugars . at home.    No problems updated.  Comprehensive ROS Pertinent positive and negative noted in HPI   Allergies  Allergen Reactions   Hydrocodone-Acetaminophen Hives and Other (See Comments)    Can take with Benadryl     Past Medical History:  Diagnosis Date   Allergy    seasonal   Arthritis    Cataract    right    GERD (gastroesophageal reflux disease)    Hyperlipidemia    Hypertension    MRSA infection    2019   Renal disorder    3 kidney stones   Sinus bradycardia seen on cardiac monitor    Urgency of urination    Urinary frequency     Current Outpatient Medications on File Prior to Visit  Medication Sig Dispense Refill   Accu-Chek Softclix Lancets lancets Use to check blood sugar 1 times daily. E11.9 100 each 6   albuterol (VENTOLIN HFA) 108 (90 Base) MCG/ACT inhaler Inhale 2 puffs into the lungs every 4 (four) hours as needed. for wheezing 51 each 3   amitriptyline (ELAVIL) 50 MG tablet Take 1 tablet (50 mg total) by mouth at bedtime. 90 tablet 1   aspirin 81 MG EC tablet Take 81 mg by mouth daily. Swallow whole.     atorvastatin (LIPITOR) 20 MG tablet Take 1 tablet (20 mg total) by mouth daily. 90 tablet 1   Blood Glucose Monitoring Suppl (ACCU-CHEK GUIDE) w/Device KIT Use to check blood sugar 1 times daily. E11.9 1 kit 0   esomeprazole (NEXIUM) 40 MG capsule TAKE 1 CAPSULE AT NOON 90 capsule 2   glucose blood (ACCU-CHEK GUIDE) test strip Use to check blood sugar 1 times daily. E11.9  100 each 6   losartan-hydrochlorothiazide (HYZAAR) 100-25 MG tablet Take 1 tablet by mouth daily. 90 tablet 1   metoprolol tartrate (LOPRESSOR) 25 MG tablet Take 1 tablet (25 mg total) by mouth 2 (two) times daily. 180 tablet 1   Multiple Vitamins-Calcium (ONE-A-DAY WOMENS PO) Take 1 tablet by mouth daily.     sitaGLIPtin-metformin (JANUMET) 50-1000 MG tablet Take 1 tablet by mouth 2 (two) times daily with a meal. 180 tablet 1   No current facility-administered medications on file prior to visit.   Health Maintenance  Topic Date Due   Zoster (Shingles) Vaccine (2 of 2) 12/01/2021   COVID-19 Vaccine (6 - 2023-24 season) 08/15/2023   Yearly kidney function blood test for diabetes  12/26/2023   Yearly kidney health urinalysis for diabetes  01/02/2024   Medicare Annual Wellness Visit  03/04/2024   Mammogram  03/10/2024   Pneumonia Vaccine (3 of 3 - PPSV23 or PCV20) 06/01/2024   DTaP/Tdap/Td vaccine (2 - Td or Tdap) 06/01/2029   Colon Cancer Screening  03/05/2032   Flu Shot  Completed   DEXA scan (bone density measurement)  Completed   Hepatitis C Screening  Completed   HPV Vaccine  Aged Out    Objective:   BP Readings from Last 3 Encounters:  08/12/23 (!) 142/78  02/26/23 120/81  01/01/23 124/81     Physical exam: General: Vital signs reviewed.  Patient is well-developed and well-nourished, xx in no acute distress and cooperative with exam. Head: Normocephalic and atraumatic. Eyes: EOMI, conjunctivae normal, no scleral icterus. Neck: Supple, trachea midline, normal ROM, no JVD, masses, thyromegaly, or carotid bruit present. Cardiovascular: RRR, S1 normal, S2 normal, no murmurs, gallops, or rubs. Pulmonary/Chest: Clear to auscultation bilaterally, no wheezes, rales, or rhonchi. Abdominal: Soft, non-tender, non-distended, BS +, no masses, organomegaly, or guarding present. Musculoskeletal: No joint deformities, erythema, or stiffness, ROM full and nontender. Extremities: No lower  extremity edema bilaterally,  pulses symmetric and intact bilaterally. No cyanosis or clubbing. Neurological: A&O x3, Strength is normal Skin: Warm, dry and intact. No rashes or erythema. Psychiatric: Normal mood and affect. speech and behavior is normal. Cognition and memory are normal.      Assessment & Plan    Diagnoses and all orders for this visit:  Type 2 diabetes mellitus without complication, without long-term current use of insulin (HCC) - educated on lifestyle modifications, including but not limited to diet choices and adding exercise to daily routine.   -     CBC with Differential/Platelet -     CMP14+EGFR -     Hemoglobin A1c -     Lipid panel  Essential hypertension BP goal - <140/90 Explained that having normal blood pressure is the goal and medications are helping to get to goal and maintain normal blood pressure. DIET: Limit salt intake, read nutrition labels to check salt content, limit fried and high fatty foods  Avoid using multisymptom OTC cold preparations that generally contain sudafed which can rise BP. Consult with pharmacist on best cold relief products to use for persons with HTN EXERCISE Discussed incorporating exercise such as walking - 30 minutes most days of the week and can do in 10 minute intervals    -     CBC with Differential/Platelet -     CMP14+EGFR  Overactive bladder 25 mg not working as well increased to 50 mg and d/w kegel exercise -     mirabegron ER (MYRBETRIQ) 50 MG TB24 tablet; Take 1 tablet (50 mg total) by mouth daily.     Patient have been counseled extensively about nutrition and exercise. Other issues discussed during this visit include: low cholesterol diet, weight control and daily exercise, foot care, annual eye examinations at Ophthalmology, importance of adherence with medications and regular follow-up. We also discussed long term complications of uncontrolled diabetes and hypertension.   Return in about 3 months (around  02/22/2024) for medical conditions.  The patient was given clear instructions to go to ER or return to medical center if symptoms don't improve, worsen or new problems develop. The patient verbalized understanding. The patient was told to call to get lab results if they haven't heard anything in the next week.   This note has been created with Education officer, environmental. Any transcriptional errors are unintentional.   Grayce Sessions, NP 11/24/2023, 4:20 PM

## 2023-11-24 NOTE — Patient Instructions (Signed)
Kegel Exercises  Kegel exercises can help strengthen your pelvic floor muscles. The pelvic floor is a group of muscles that support your rectum, small intestine, and bladder. In females, pelvic floor muscles also help support the uterus. These muscles help you control the flow of urine and stool (feces). Kegel exercises are painless and simple. They do not require any equipment. Your provider may suggest Kegel exercises to: Improve bladder and bowel control. Improve sexual response. Improve weak pelvic floor muscles after surgery to remove the uterus (hysterectomy) or after pregnancy, in females. Improve weak pelvic floor muscles after prostate gland removal or surgery, in males. Kegel exercises involve squeezing your pelvic floor muscles. These are the same muscles you squeeze when you try to stop the flow of urine or keep from passing gas. The exercises can be done while sitting, standing, or lying down, but it is best to vary your position. Ask your health care provider which exercises are safe for you. Do exercises exactly as told by your health care provider and adjust them as directed. Do not begin these exercises until told by your health care provider. Exercises How to do Kegel exercises: Squeeze your pelvic floor muscles tight. You should feel a tight lift in your rectal area. If you are a female, you should also feel a tightness in your vaginal area. Keep your stomach, buttocks, and legs relaxed. Hold the muscles tight for up to 10 seconds. Breathe normally. Relax your muscles for up to 10 seconds. Repeat as told by your health care provider. Repeat this exercise daily as told by your health care provider. Continue to do this exercise for at least 4-6 weeks, or for as long as told by your health care provider. You may be referred to a physical therapist who can help you learn more about how to do Kegel exercises. Depending on your condition, your health care provider may  recommend: Varying how long you squeeze your muscles. Doing several sets of exercises every day. Doing exercises for several weeks. Making Kegel exercises a part of your regular exercise routine. This information is not intended to replace advice given to you by your health care provider. Make sure you discuss any questions you have with your health care provider. Document Revised: 04/10/2021 Document Reviewed: 04/10/2021 Elsevier Patient Education  2024 Elsevier Inc.  

## 2023-11-25 LAB — CMP14+EGFR
ALT: 16 [IU]/L (ref 0–32)
AST: 15 [IU]/L (ref 0–40)
Albumin: 4.3 g/dL (ref 3.9–4.9)
Alkaline Phosphatase: 181 [IU]/L — ABNORMAL HIGH (ref 44–121)
BUN/Creatinine Ratio: 20 (ref 12–28)
BUN: 18 mg/dL (ref 8–27)
Bilirubin Total: <0.2 mg/dL (ref 0.0–1.2)
CO2: 23 mmol/L (ref 20–29)
Calcium: 10.5 mg/dL — ABNORMAL HIGH (ref 8.7–10.3)
Chloride: 102 mmol/L (ref 96–106)
Creatinine, Ser: 0.92 mg/dL (ref 0.57–1.00)
Globulin, Total: 2.4 g/dL (ref 1.5–4.5)
Glucose: 117 mg/dL — ABNORMAL HIGH (ref 70–99)
Potassium: 4.1 mmol/L (ref 3.5–5.2)
Sodium: 142 mmol/L (ref 134–144)
Total Protein: 6.7 g/dL (ref 6.0–8.5)
eGFR: 68 mL/min/{1.73_m2} (ref >59)

## 2023-11-25 LAB — LIPID PANEL
Chol/HDL Ratio: 2.5 {ratio} (ref 0.0–4.4)
Cholesterol, Total: 119 mg/dL (ref 100–199)
HDL: 47 mg/dL (ref >39)
LDL Chol Calc (NIH): 42 mg/dL (ref 0–99)
Triglycerides: 182 mg/dL — ABNORMAL HIGH (ref 0–149)
VLDL Cholesterol Cal: 30 mg/dL (ref 5–40)

## 2023-11-25 LAB — CBC WITH DIFFERENTIAL/PLATELET
Basophils Absolute: 0 10*3/uL (ref 0.0–0.2)
Basos: 0 %
EOS (ABSOLUTE): 0.4 10*3/uL (ref 0.0–0.4)
Eos: 4 %
Hematocrit: 40.3 % (ref 34.0–46.6)
Hemoglobin: 13.4 g/dL (ref 11.1–15.9)
Immature Grans (Abs): 0.1 10*3/uL (ref 0.0–0.1)
Immature Granulocytes: 1 %
Lymphocytes Absolute: 3.3 10*3/uL — ABNORMAL HIGH (ref 0.7–3.1)
Lymphs: 32 %
MCH: 28.9 pg (ref 26.6–33.0)
MCHC: 33.3 g/dL (ref 31.5–35.7)
MCV: 87 fL (ref 79–97)
Monocytes Absolute: 0.6 10*3/uL (ref 0.1–0.9)
Monocytes: 5 %
Neutrophils Absolute: 6.1 10*3/uL (ref 1.4–7.0)
Neutrophils: 58 %
Platelets: 356 10*3/uL (ref 150–450)
RBC: 4.64 x10E6/uL (ref 3.77–5.28)
RDW: 14.2 % (ref 11.7–15.4)
WBC: 10.5 10*3/uL (ref 3.4–10.8)

## 2023-11-25 LAB — HEMOGLOBIN A1C
Est. average glucose Bld gHb Est-mCnc: 137 mg/dL
Hgb A1c MFr Bld: 6.4 % — ABNORMAL HIGH (ref 4.8–5.6)

## 2023-11-26 ENCOUNTER — Telehealth: Payer: Self-pay

## 2023-11-26 NOTE — Telephone Encounter (Signed)
   Telephone encounter was:  Unsuccessful.  11/26/2023 Name: Amber Meyer MRN: [id number] DOB: January 21, 1956  Unsuccessful outbound call made today to assist with:  Food Insecurity  Outreach Attempt:  1st Attempt  A HIPAA compliant voice message was left requesting a return call.  Instructed patient to call back at [phone].  Debby Clyne [facility]  [facility], [facility] Guide Direct Dial: [phone]  Website: [email or web address]

## 2023-11-30 ENCOUNTER — Telehealth: Payer: Self-pay

## 2023-11-30 NOTE — Progress Notes (Signed)
  11/30/2023 Name: TALLIYAH VENTRONE MRN: [id number] DOB: 11-Mar-1956  JASREET ACE is a 67 y.o. year old female who is a primary care patient of Grayce Sessions, NP . The community resource team was consulted for assistance with Food Insecurity  SDOH screenings and interventions completed:  No  SDOH Interventions Today    Flowsheet Row Most Recent Value  SDOH Interventions   Food Insecurity Interventions Other (Comment)  [Spoke with patient she is currently in [place] with a family emergency request that I call her next week.]        Care guide performed the following interventions: Patient provided with information about care guide support team and interviewed to confirm resource needs.  Follow Up Plan:  Care guide will follow up with patient by phone over the next 7 days.  Encounter Outcome:  Patient Request to Call Back  Brieann Osinski [facility]  [facility], [facility] Guide Direct Dial: [phone]  Website: [email or web address]

## 2023-12-06 ENCOUNTER — Telehealth: Payer: Self-pay

## 2023-12-06 NOTE — Progress Notes (Signed)
Complex Care Management Note Care Guide Note  12/06/2023 Name: [name] MRN: [id number] DOB: 10/28/1956  Amber Meyer is a 67 y.o. year old female who is a primary care patient of Grayce Sessions, NP . The community resource team was consulted for assistance with Food Insecurity  SDOH screenings and interventions completed:  Yes  SDOH Interventions Today    Flowsheet Row Most Recent Value  SDOH Interventions   Food Insecurity Interventions Other (Comment)  [facility] food pantry list per patient request.]        Care guide performed the following interventions: Spoke with patient she is still out of town on a family emergency and requested that I send a food pantry list to her home address. No further assistance is needed at this time.  Follow Up Plan:  No further follow up planned at this time. The patient has been provided with needed resources.  Encounter Outcome:  Patient Visit Completed  Rucker Pridgeon [facility]  [facility], [facility] Guide Direct Dial: [phone]  Website: [email or web address]

## 2024-01-11 ENCOUNTER — Other Ambulatory Visit (INDEPENDENT_AMBULATORY_CARE_PROVIDER_SITE_OTHER): Payer: Self-pay | Admitting: Primary Care

## 2024-01-11 DIAGNOSIS — J9801 Acute bronchospasm: Secondary | ICD-10-CM

## 2024-01-11 MED ORDER — ALBUTEROL SULFATE HFA 108 (90 BASE) MCG/ACT IN AERS: 2.0000 | INHALATION_SPRAY | RESPIRATORY_TRACT | 3 refills | Status: DC | PRN

## 2024-01-11 MED ORDER — ESOMEPRAZOLE MAGNESIUM 40 MG PO CPDR: DELAYED_RELEASE_CAPSULE | ORAL | 2 refills | Status: DC

## 2024-01-11 NOTE — Telephone Encounter (Signed)
Copied from CRM [id number]. Topic: Clinical - Medication Refill >> Jan 11, 2024  8:38 AM Fuller Canada P wrote: Most Recent Primary Care Visit:  Provider: Grayce Sessions  Department: [facility]  Visit Type: OFFICE VISIT  Date: 11/24/2023  Medication: esomeprazole (NEXIUM) 40 MG capsule albuterol (VENTOLIN HFA) 108 (90 Base) MCG/ACT inhaler    Has the patient contacted their pharmacy? Yes (Agent: If no, request that the patient contact the pharmacy for the refill. If patient does not wish to contact the pharmacy document the reason why and proceed with request.) (Agent: If yes, when and what did the pharmacy advise?)  Is this the correct pharmacy for this prescription? No If no, delete pharmacy and type the correct one.  This is the patient's preferred pharmacy:  [facility]  [address], [address],  [place], [place] [address] Phone: [phone]     Has the prescription been filled recently? No  Is the patient out of the medication? No  Has the patient been seen for an appointment in the last year OR does the patient have an upcoming appointment? Yes  Can we respond through MyChart? Yes  Agent: Please be advised that Rx refills may take up to 3 business days. We ask that you follow-up with your pharmacy.

## 2024-01-13 NOTE — Telephone Encounter (Signed)
Copied from CRM [id number]. Topic: Clinical - Prescription Issue >> Jan 11, 2024  8:35 AM Ivette P wrote: Reason for CRM: Pt is requesting all medications from old insurance [employer] be switched over to new [employer], New Pharmacy will be [facility]. Pt callback [phone]   Patient has call [facility] to Request the change.

## 2024-01-18 ENCOUNTER — Ambulatory Visit (INDEPENDENT_AMBULATORY_CARE_PROVIDER_SITE_OTHER): Payer: 59 | Admitting: Primary Care

## 2024-02-22 ENCOUNTER — Other Ambulatory Visit (INDEPENDENT_AMBULATORY_CARE_PROVIDER_SITE_OTHER): Payer: Self-pay | Admitting: Primary Care

## 2024-02-22 ENCOUNTER — Encounter (INDEPENDENT_AMBULATORY_CARE_PROVIDER_SITE_OTHER): Payer: Self-pay | Admitting: Primary Care

## 2024-02-22 ENCOUNTER — Ambulatory Visit (INDEPENDENT_AMBULATORY_CARE_PROVIDER_SITE_OTHER): Payer: Medicare HMO | Admitting: Primary Care

## 2024-02-22 ENCOUNTER — Telehealth: Payer: Self-pay

## 2024-02-22 VITALS — BP 138/83 | HR 90 | Wt 228.6 lb

## 2024-02-22 DIAGNOSIS — G4700 Insomnia, unspecified: Secondary | ICD-10-CM | POA: Diagnosis not present

## 2024-02-22 DIAGNOSIS — E119 Type 2 diabetes mellitus without complications: Secondary | ICD-10-CM

## 2024-02-22 DIAGNOSIS — R519 Headache, unspecified: Secondary | ICD-10-CM | POA: Diagnosis not present

## 2024-02-22 DIAGNOSIS — I10 Essential (primary) hypertension: Secondary | ICD-10-CM | POA: Diagnosis not present

## 2024-02-22 DIAGNOSIS — N3281 Overactive bladder: Secondary | ICD-10-CM

## 2024-02-22 DIAGNOSIS — Z760 Encounter for issue of repeat prescription: Secondary | ICD-10-CM

## 2024-02-22 DIAGNOSIS — Z7984 Long term (current) use of oral hypoglycemic drugs: Secondary | ICD-10-CM

## 2024-02-22 MED ORDER — IBUPROFEN 600 MG PO TABS: 600.0000 mg | ORAL_TABLET | Freq: Three times a day (TID) | ORAL | 1 refills | Status: DC | PRN

## 2024-02-22 MED ORDER — MIRABEGRON ER 50 MG PO TB24: 50.0000 mg | ORAL_TABLET | Freq: Every day | ORAL | 1 refills | Status: DC

## 2024-02-22 MED ORDER — JANUMET 50-1000 MG PO TABS: 1.0000 | ORAL_TABLET | Freq: Two times a day (BID) | ORAL | 1 refills | Status: DC

## 2024-02-22 MED ORDER — ATORVASTATIN CALCIUM 20 MG PO TABS: 20.0000 mg | ORAL_TABLET | Freq: Every day | ORAL | 1 refills | Status: DC

## 2024-02-22 MED ORDER — AMITRIPTYLINE HCL 50 MG PO TABS: 50.0000 mg | ORAL_TABLET | Freq: Every day | ORAL | 1 refills | Status: DC

## 2024-02-22 MED ORDER — METOPROLOL TARTRATE 25 MG PO TABS: 25.0000 mg | ORAL_TABLET | Freq: Two times a day (BID) | ORAL | 1 refills | Status: DC

## 2024-02-22 MED ORDER — LOSARTAN POTASSIUM-HCTZ 100-25 MG PO TABS: 1.0000 | ORAL_TABLET | Freq: Every day | ORAL | 1 refills | Status: DC

## 2024-02-22 NOTE — Progress Notes (Addendum)
 Subjective:  Patient ID: Amber Meyer, female    DOB: August 16, 1956  Age: 68 y.o. MRN: [id number]  CC: Medical Management of Chronic Issues   Amber Meyer presents for Follow-up of diabetes. Patient does check blood sugar at home.  Patient has been going through some stressful events including losing her sister and her grandson  had to have open heart surgery. She has been spending a lot of time in the [place] only here today for her appointment will refill her medications for 3 months and 1 additional refill.  If she has any problems or complications and need to extend her stay in [place] she will call the office.  She also mention at the closing of our visit that she has been having headaches daily due to stress and having to take 200 mg ibuprofen x 2 with no relief. Compliant with meds - Yes Checking CBGs? Yes  Fasting avg - 133-166  Postprandial average -  Exercising regularly? - Yes Watching carbohydrate intake? - Yes Neuropathy ? - No Hypoglycemic events - No  - Recovers with :   Pertinent ROS:  Polyuria - No Polydipsia - No Vision problems - No  Medications as noted below. Taking them regularly without complication/adverse reaction being reported today.   History Amber Meyer has a past medical history of Allergy, Arthritis, Cataract, GERD (gastroesophageal reflux disease), Hyperlipidemia, Hypertension, MRSA infection, Renal disorder, Sinus bradycardia seen on cardiac monitor, Urgency of urination, and Urinary frequency.   She has a past surgical history that includes Abdominal hysterectomy (1981); Knee surgery (Right, 2013); Lithotripsy (2003, 2006); Kidney stone removal (Left, 1999); Knee Arthroplasty (Right, 12/23/2017); and Colonoscopy.   Her family history includes Alzheimer's disease in her father; Aneurysm in her mother.She reports that she has been smoking cigarettes. She has been exposed to tobacco smoke. She has never used smokeless tobacco. She reports that she  does not drink alcohol and does not use drugs.  Current Outpatient Medications on File Prior to Visit  Medication Sig Dispense Refill   Accu-Chek Softclix Lancets lancets Use to check blood sugar 1 times daily. E11.9 100 each 6   albuterol (VENTOLIN HFA) 108 (90 Base) MCG/ACT inhaler Inhale 2 puffs into the lungs every 4 (four) hours as needed. for wheezing 51 each 3   aspirin 81 MG EC tablet Take 81 mg by mouth daily. Swallow whole.     Blood Glucose Monitoring Suppl (ACCU-CHEK GUIDE) w/Device KIT Use to check blood sugar 1 times daily. E11.9 1 kit 0   esomeprazole (NEXIUM) 40 MG capsule TAKE 1 CAPSULE AT NOON 90 capsule 2   glucose blood (ACCU-CHEK GUIDE) test strip Use to check blood sugar 1 times daily. E11.9 100 each 6   Multiple Vitamins-Calcium (ONE-A-DAY WOMENS PO) Take 1 tablet by mouth daily.     No current facility-administered medications on file prior to visit.    Review of Systems Comprehensive ROS Pertinent positive and negative noted in HPI   Objective:  BP 138/83 (BP Location: Left Arm, Patient Position: Sitting, Cuff Size: Large)   Pulse 90   Wt 228 lb 9.6 oz (103.7 kg)   SpO2 98%   BMI 35.80 kg/m   BP Readings from Last 3 Encounters:  02/22/24 138/83  11/24/23 (!) 144/88  08/12/23 (!) 142/78    Wt Readings from Last 3 Encounters:  02/22/24 228 lb 9.6 oz (103.7 kg)  11/24/23 230 lb 9.6 oz (104.6 kg)  08/12/23 234 lb 12.8 oz (106.5 kg)    Physical  Exam Vitals reviewed.  Constitutional:      Appearance: She is obese.  HENT:     Head: Normocephalic.     Right Ear: Tympanic membrane and external ear normal.     Left Ear: Tympanic membrane and external ear normal.     Nose: Nose normal.  Cardiovascular:     Rate and Rhythm: Normal rate and regular rhythm.  Pulmonary:     Effort: Pulmonary effort is normal.     Breath sounds: Normal breath sounds.  Abdominal:     General: Bowel sounds are normal. There is distension.     Palpations: Abdomen is soft.   Musculoskeletal:        General: Normal range of motion.     Cervical back: Normal range of motion and neck supple.  Skin:    General: Skin is warm and dry.  Neurological:     Mental Status: She is oriented to person, place, and time.     Lab Results  Component Value Date   HGBA1C 6.4 (H) 11/24/2023   HGBA1C 6.1 08/12/2023   HGBA1C 6.6 02/26/2023    Lab Results  Component Value Date   WBC 10.5 11/24/2023   HGB 13.4 11/24/2023   HCT 40.3 11/24/2023   PLT 356 11/24/2023   GLUCOSE 117 (H) 11/24/2023   CHOL 119 11/24/2023   TRIG 182 (H) 11/24/2023   HDL 47 11/24/2023   LDLCALC 42 11/24/2023   ALT 16 11/24/2023   AST 15 11/24/2023   NA 142 11/24/2023   K 4.1 11/24/2023   CL 102 11/24/2023   CREATININE 0.92 11/24/2023   BUN 18 11/24/2023   CO2 23 11/24/2023   TSH 1.160 07/03/2021   HGBA1C 6.4 (H) 11/24/2023     Assessment & Plan:  Amber Meyer was seen today for medical management of chronic issues.  Diagnoses and all orders for this visit:  Type 2 diabetes mellitus without complication, without long-term current use of insulin (HCC) -     Microalbumin / creatinine urine ratio -     sitaGLIPtin-metformin (JANUMET) 50-1000 MG tablet; Take 1 tablet by mouth 2 (two) times daily with a meal.  Medication refill -     sitaGLIPtin-metformin (JANUMET) 50-1000 MG tablet; Take 1 tablet by mouth 2 (two) times daily with a meal. -     metoprolol tartrate (LOPRESSOR) 25 MG tablet; Take 1 tablet (25 mg total) by mouth 2 (two) times daily. -     losartan-hydrochlorothiazide (HYZAAR) 100-25 MG tablet; Take 1 tablet by mouth daily. -     atorvastatin (LIPITOR) 20 MG tablet; Take 1 tablet (20 mg total) by mouth daily. -     amitriptyline (ELAVIL) 50 MG tablet; Take 1 tablet (50 mg total) by mouth at bedtime.  Overactive bladder -     mirabegron ER (MYRBETRIQ) 50 MG TB24 tablet; Take 1 tablet (50 mg total) by mouth daily.  Essential hypertension Controlled  -     metoprolol  tartrate (LOPRESSOR) 25 MG tablet; Take 1 tablet (25 mg total) by mouth 2 (two) times daily. -     losartan-hydrochlorothiazide (HYZAAR) 100-25 MG tablet; Take 1 tablet by mouth daily.  Insomnia, unspecified type -     amitriptyline (ELAVIL) 50 MG tablet; Take 1 tablet (50 mg total) by mouth at bedtime.   Frequent headaches  Stress ibuprofen prescribed  Follow-up:  Return in about 6 months (around 08/24/2024) for fasting labs.  The above assessment and management plan was discussed with the patient. The patient  verbalized understanding of and has agreed to the management plan. Patient is aware to call the clinic if symptoms fail to improve or worsen. Patient is aware when to return to the clinic for a follow-up visit. Patient educated on when it is appropriate to go to the emergency department.   Gwinda Passe, NP-C

## 2024-02-22 NOTE — Telephone Encounter (Unsigned)
 Copied from CRM [id number]. Topic: Clinical - Medication Question >> Feb 22, 2024  3:15 PM Amber Meyer wrote: Reason for CRM: Pt stated [facility] on [address] cannot fill her Ibuprofen because it was already filled somewhere else. Pt stated a new script will need to [facility] to [facility] while the other needs to be cancelled out.

## 2024-02-22 NOTE — Telephone Encounter (Signed)
 Will forward to provider

## 2024-02-22 NOTE — Addendum Note (Signed)
 Addended by: Gwinda Passe on: 02/22/2024 02:09 PM   Modules accepted: Orders

## 2024-03-23 ENCOUNTER — Telehealth (INDEPENDENT_AMBULATORY_CARE_PROVIDER_SITE_OTHER): Payer: Self-pay

## 2024-03-23 NOTE — Telephone Encounter (Signed)
 Received a letter from [employer] in regards to pt medication. Per letter pt Mirabegron 50mg  Tablet is not covered under pt drug plan (formulary) and they provided pt a short supply.   Per letter their suggested alternative is Oxybutyin ER which is a tier 2 and Myrbetriq, Solifenacin, Tolterodine, Trospium Tabs which are tier 3 drugs   Please change if appropriate

## 2024-03-26 ENCOUNTER — Other Ambulatory Visit (INDEPENDENT_AMBULATORY_CARE_PROVIDER_SITE_OTHER): Payer: Self-pay | Admitting: Primary Care

## 2024-03-26 MED ORDER — OXYBUTYNIN CHLORIDE ER 10 MG PO TB24
10.0000 mg | ORAL_TABLET | Freq: Every day | ORAL | 1 refills | Status: DC
Start: 2024-03-26 — End: 2024-10-04

## 2024-03-27 NOTE — Telephone Encounter (Signed)
 Reached out to pt to go over provider response pt is aware and doesn't have any questions or concerns

## 2024-04-03 ENCOUNTER — Other Ambulatory Visit (INDEPENDENT_AMBULATORY_CARE_PROVIDER_SITE_OTHER): Payer: Self-pay | Admitting: Primary Care

## 2024-04-03 NOTE — Telephone Encounter (Signed)
 Requested Prescriptions  Pending Prescriptions Disp Refills   ibuprofen  (ADVIL ) 600 MG tablet [Pharmacy Med Name: IBUPROFEN   600MG   TAB] 90 tablet 1    Sig: TAKE 1 TABLET BY MOUTH EVERY 8  HOURS AS NEEDED     Analgesics:  NSAIDS Failed - 04/03/2024  5:07 PM      Failed - Manual Review: Labs are only required if the patient has taken medication for more than 8 weeks.      Passed - Cr in normal range and within 360 days    Creat  Date Value Ref Range Status  01/16/2014 0.83 0.50 - 1.10 mg/dL Final   Creatinine, Ser  Date Value Ref Range Status  11/24/2023 0.92 0.57 - 1.00 mg/dL Final         Passed - HGB in normal range and within 360 days    Hemoglobin  Date Value Ref Range Status  11/24/2023 13.4 11.1 - 15.9 g/dL Final         Passed - PLT in normal range and within 360 days    Platelets  Date Value Ref Range Status  11/24/2023 356 150 - 450 x10E3/uL Final   Platelet Count, POC  Date Value Ref Range Status  01/16/2014 331 142 - 424 K/uL Final         Passed - HCT in normal range and within 360 days    Hematocrit  Date Value Ref Range Status  11/24/2023 40.3 34.0 - 46.6 % Final         Passed - eGFR is 30 or above and within 360 days    GFR calc Af Amer  Date Value Ref Range Status  10/28/2020 68 >59 mL/min/1.73 Final    Comment:    **In accordance with recommendations from the NKF-ASN Task force,**   [facility] is in the process of updating its eGFR calculation to the   2021 CKD-EPI creatinine equation that estimates kidney function   without a race variable.    GFR, Estimated  Date Value Ref Range Status  12/25/2022 52 (L) >60 mL/min Final    Comment:    (NOTE) Calculated using the CKD-EPI Creatinine Equation (2021)    eGFR  Date Value Ref Range Status  11/24/2023 68 >59 mL/min/1.73 Final         Passed - Patient is not pregnant      Passed - Valid encounter within last 12 months    Recent Outpatient Visits           1 month ago Frequent headaches    [place] [facility] Marius Siemens, NP   4 months ago Type 2 diabetes mellitus without complication, without long-term current use of insulin  (HCC)   [place] [facility] Marius Siemens, NP   7 months ago Type 2 diabetes mellitus without complication, without long-term current use of insulin  [facility])   [place] [facility] Marius Siemens, NP   1 year ago Encounter for Medicare annual wellness exam   [place] [facility] Marius Siemens, NP   1 year ago Type 2 diabetes mellitus without complication, without long-term current use of insulin  [facility])   [place] [facility] Marius Siemens, NP       Future Appointments             In 4 months Denece Finger, Meade Spencer, NP [place] [facility]

## 2024-05-01 ENCOUNTER — Other Ambulatory Visit (INDEPENDENT_AMBULATORY_CARE_PROVIDER_SITE_OTHER): Payer: Self-pay | Admitting: Primary Care

## 2024-05-01 DIAGNOSIS — I10 Essential (primary) hypertension: Secondary | ICD-10-CM

## 2024-05-01 DIAGNOSIS — E119 Type 2 diabetes mellitus without complications: Secondary | ICD-10-CM

## 2024-05-01 DIAGNOSIS — Z760 Encounter for issue of repeat prescription: Secondary | ICD-10-CM

## 2024-05-03 NOTE — Telephone Encounter (Signed)
 All meds refilled 02/22/24 6 month refill  Requested Prescriptions  Refused Prescriptions Disp Refills   metoprolol  tartrate (LOPRESSOR ) 25 MG tablet [Pharmacy Med Name: Metoprolol  Tartrate 25 MG Oral Tablet] 200 tablet 2    Sig: TAKE 1 TABLET BY MOUTH TWICE  DAILY     Cardiovascular:  Beta Blockers Passed - 05/03/2024  9:23 AM      Passed - Last BP in normal range    BP Readings from Last 1 Encounters:  02/22/24 138/83         Passed - Last Heart Rate in normal range    Pulse Readings from Last 1 Encounters:  02/22/24 90         Passed - Valid encounter within last 6 months    Recent Outpatient Visits           2 months ago Frequent headaches   [place] [facility] Marius Siemens, NP   5 months ago Type 2 diabetes mellitus without complication, without long-term current use of insulin  (HCC)   [place] [facility] Marius Siemens, NP   8 months ago Type 2 diabetes mellitus without complication, without long-term current use of insulin  (HCC)   [place] [facility] Marius Siemens, NP   1 year ago Encounter for Medicare annual wellness exam   [place] [facility] Marius Siemens, NP   1 year ago Type 2 diabetes mellitus without complication, without long-term current use of insulin  (HCC)   [place] [facility] Marius Siemens, NP       Future Appointments             In 3 months Edwards, Meade Spencer, NP [place] [facility]             JANUMET  50-1000 MG tablet [Pharmacy Med Name: Janumet  50-1000 MG Oral Tablet] 200 tablet 2    Sig: TAKE 1 TABLET BY MOUTH TWICE  DAILY WITH A MEAL     Endocrinology:  Diabetes - Biguanide + DPP-4 Inhibitor Combos Failed - 05/03/2024  9:23 AM      Failed - B12 Level in normal range and within 720 days    No results found for: "VITAMINB12"       Passed - HBA1C is between 0 and 7.9 and within 180 days     HbA1c, POC (controlled diabetic range)  Date Value Ref Range Status  08/12/2023 6.1 0.0 - 7.0 % Final   Hgb A1c MFr Bld  Date Value Ref Range Status  11/24/2023 6.4 (H) 4.8 - 5.6 % Final    Comment:             Prediabetes: 5.7 - 6.4          Diabetes: >6.4          Glycemic control for adults with diabetes: <7.0          Passed - Cr in normal range and within 360 days    Creat  Date Value Ref Range Status  01/16/2014 0.83 0.50 - 1.10 mg/dL Final   Creatinine, Ser  Date Value Ref Range Status  11/24/2023 0.92 0.57 - 1.00 mg/dL Final         Passed - eGFR in normal range and within 360 days    GFR calc Af Amer  Date Value Ref Range Status  10/28/2020 68 >59 mL/min/1.73 Final    Comment:    **In accordance with recommendations from  the NKF-ASN Task force,**   [facility] is in the process of updating its eGFR calculation to the   2021 CKD-EPI creatinine equation that estimates kidney function   without a race variable.    GFR, Estimated  Date Value Ref Range Status  12/25/2022 52 (L) >60 mL/min Final    Comment:    (NOTE) Calculated using the CKD-EPI Creatinine Equation (2021)    eGFR  Date Value Ref Range Status  11/24/2023 68 >59 mL/min/1.73 Final         Passed - Valid encounter within last 6 months    Recent Outpatient Visits           2 months ago Frequent headaches   [place] [facility] Marius Siemens, NP   5 months ago Type 2 diabetes mellitus without complication, without long-term current use of insulin  (HCC)   [place] [facility] Marius Siemens, NP   8 months ago Type 2 diabetes mellitus without complication, without long-term current use of insulin  (HCC)   [place] [facility] Marius Siemens, NP   1 year ago Encounter for Medicare annual wellness exam   [place] [facility] Marius Siemens, NP   1 year ago Type 2 diabetes mellitus without complication,  without long-term current use of insulin  (HCC)   [place] [facility] Marius Siemens, NP       Future Appointments             In 3 months Edwards, Meade Spencer, NP [place] [facility]            Passed - CBC within normal limits and completed in the last 12 months    WBC  Date Value Ref Range Status  11/24/2023 10.5 3.4 - 10.8 x10E3/uL Final  12/25/2022 12.9 (H) 4.0 - 10.5 K/uL Final   RBC  Date Value Ref Range Status  11/24/2023 4.64 3.77 - 5.28 x10E6/uL Final  12/25/2022 4.91 3.87 - 5.11 MIL/uL Final   Hemoglobin  Date Value Ref Range Status  11/24/2023 13.4 11.1 - 15.9 g/dL Final   Hematocrit  Date Value Ref Range Status  11/24/2023 40.3 34.0 - 46.6 % Final   MCHC  Date Value Ref Range Status  11/24/2023 33.3 31.5 - 35.7 g/dL Final  60/09/9322 55.7 30.0 - 36.0 g/dL Final   [facility]  Date Value Ref Range Status  11/24/2023 28.9 26.6 - 33.0 pg Final  12/25/2022 29.3 26.0 - 34.0 pg Final   MCV  Date Value Ref Range Status  11/24/2023 87 79 - 97 fL Final   Platelet Count, POC  Date Value Ref Range Status  01/16/2014 331 142 - 424 K/uL Final   RDW  Date Value Ref Range Status  11/24/2023 14.2 11.7 - 15.4 % Final   RDW, POC  Date Value Ref Range Status  01/16/2014 15.4 % Final          losartan -hydrochlorothiazide  (HYZAAR) 100-25 MG tablet [Pharmacy Med Name: Losartan  Potassium-HCTZ 100-25 MG Oral Tablet] 100 tablet 2    Sig: TAKE 1 TABLET BY MOUTH DAILY     Cardiovascular: ARB + Diuretic Combos Passed - 05/03/2024  9:23 AM      Passed - K in normal range and within 180 days    Potassium  Date Value Ref Range Status  11/24/2023 4.1 3.5 - 5.2 mmol/L Final         Passed - Na in normal range and  within 180 days    Sodium  Date Value Ref Range Status  11/24/2023 142 134 - 144 mmol/L Final         Passed - Cr in normal range and within 180 days    Creat  Date Value Ref Range Status  01/16/2014 0.83 0.50 -  1.10 mg/dL Final   Creatinine, Ser  Date Value Ref Range Status  11/24/2023 0.92 0.57 - 1.00 mg/dL Final         Passed - eGFR is 10 or above and within 180 days    GFR calc Af Amer  Date Value Ref Range Status  10/28/2020 68 >59 mL/min/1.73 Final    Comment:    **In accordance with recommendations from the NKF-ASN Task force,**   [facility] is in the process of updating its eGFR calculation to the   2021 CKD-EPI creatinine equation that estimates kidney function   without a race variable.    GFR, Estimated  Date Value Ref Range Status  12/25/2022 52 (L) >60 mL/min Final    Comment:    (NOTE) Calculated using the CKD-EPI Creatinine Equation (2021)    eGFR  Date Value Ref Range Status  11/24/2023 68 >59 mL/min/1.73 Final         Passed - Patient is not pregnant      Passed - Last BP in normal range    BP Readings from Last 1 Encounters:  02/22/24 138/83         Passed - Valid encounter within last 6 months    Recent Outpatient Visits           2 months ago Frequent headaches   [place] [facility] Marius Siemens, NP   5 months ago Type 2 diabetes mellitus without complication, without long-term current use of insulin  (HCC)   [place] [facility] Marius Siemens, NP   8 months ago Type 2 diabetes mellitus without complication, without long-term current use of insulin  (HCC)   [place] [facility] Marius Siemens, NP   1 year ago Encounter for Medicare annual wellness exam   [place] [facility] Marius Siemens, NP   1 year ago Type 2 diabetes mellitus without complication, without long-term current use of insulin  (HCC)   [place] [facility] Marius Siemens, NP       Future Appointments             In 3 months Edwards, Meade Spencer, NP [place] [facility]             atorvastatin  (LIPITOR) 20 MG tablet [Pharmacy Med Name:  Atorvastatin  Calcium  20 MG Oral Tablet] 100 tablet 2    Sig: TAKE 1 TABLET BY MOUTH DAILY     Cardiovascular:  Antilipid - Statins Failed - 05/03/2024  9:23 AM      Failed - Lipid Panel in normal range within the last 12 months    Cholesterol, Total  Date Value Ref Range Status  11/24/2023 119 100 - 199 mg/dL Final   LDL Chol Calc (NIH)  Date Value Ref Range Status  11/24/2023 42 0 - 99 mg/dL Final   HDL  Date Value Ref Range Status  11/24/2023 47 >39 mg/dL Final   Triglycerides  Date Value Ref Range Status  11/24/2023 182 (H) 0 - 149 mg/dL Final         Passed - Patient is not pregnant      Passed -  Valid encounter within last 12 months    Recent Outpatient Visits           2 months ago Frequent headaches   [place] [facility] Marius Siemens, NP   5 months ago Type 2 diabetes mellitus without complication, without long-term current use of insulin  (HCC)   [place] [facility] Marius Siemens, NP   8 months ago Type 2 diabetes mellitus without complication, without long-term current use of insulin  [facility])   [place] [facility] Marius Siemens, NP   1 year ago Encounter for Medicare annual wellness exam   [place] [facility] Marius Siemens, NP   1 year ago Type 2 diabetes mellitus without complication, without long-term current use of insulin  [facility])   [place] [facility] Marius Siemens, NP       Future Appointments             In 3 months Denece Finger, Meade Spencer, NP [place] [facility]

## 2024-05-19 DIAGNOSIS — M79674 Pain in right toe(s): Secondary | ICD-10-CM | POA: Diagnosis not present

## 2024-05-19 DIAGNOSIS — R200 Anesthesia of skin: Secondary | ICD-10-CM | POA: Diagnosis not present

## 2024-05-22 ENCOUNTER — Other Ambulatory Visit (INDEPENDENT_AMBULATORY_CARE_PROVIDER_SITE_OTHER): Payer: Self-pay | Admitting: Primary Care

## 2024-05-22 DIAGNOSIS — N3281 Overactive bladder: Secondary | ICD-10-CM

## 2024-05-28 ENCOUNTER — Other Ambulatory Visit (INDEPENDENT_AMBULATORY_CARE_PROVIDER_SITE_OTHER): Payer: Self-pay | Admitting: Primary Care

## 2024-05-29 NOTE — Telephone Encounter (Signed)
 Will forward to provider

## 2024-06-06 DIAGNOSIS — Z1231 Encounter for screening mammogram for malignant neoplasm of breast: Secondary | ICD-10-CM | POA: Diagnosis not present

## 2024-06-06 LAB — HM MAMMOGRAPHY

## 2024-06-08 ENCOUNTER — Other Ambulatory Visit (INDEPENDENT_AMBULATORY_CARE_PROVIDER_SITE_OTHER): Payer: Self-pay | Admitting: Primary Care

## 2024-06-08 DIAGNOSIS — G4700 Insomnia, unspecified: Secondary | ICD-10-CM

## 2024-06-08 DIAGNOSIS — Z760 Encounter for issue of repeat prescription: Secondary | ICD-10-CM

## 2024-06-15 ENCOUNTER — Other Ambulatory Visit (INDEPENDENT_AMBULATORY_CARE_PROVIDER_SITE_OTHER): Payer: Self-pay | Admitting: Primary Care

## 2024-06-15 DIAGNOSIS — J9801 Acute bronchospasm: Secondary | ICD-10-CM

## 2024-07-03 ENCOUNTER — Other Ambulatory Visit (INDEPENDENT_AMBULATORY_CARE_PROVIDER_SITE_OTHER): Payer: Self-pay | Admitting: Primary Care

## 2024-07-03 DIAGNOSIS — J9801 Acute bronchospasm: Secondary | ICD-10-CM

## 2024-07-04 ENCOUNTER — Other Ambulatory Visit (INDEPENDENT_AMBULATORY_CARE_PROVIDER_SITE_OTHER): Payer: Self-pay

## 2024-07-04 DIAGNOSIS — Z760 Encounter for issue of repeat prescription: Secondary | ICD-10-CM

## 2024-07-04 DIAGNOSIS — G4700 Insomnia, unspecified: Secondary | ICD-10-CM

## 2024-07-04 DIAGNOSIS — I10 Essential (primary) hypertension: Secondary | ICD-10-CM

## 2024-07-04 MED ORDER — AMITRIPTYLINE HCL 50 MG PO TABS: 50.0000 mg | ORAL_TABLET | Freq: Every day | ORAL | 1 refills | Status: DC

## 2024-07-04 MED ORDER — ATORVASTATIN CALCIUM 20 MG PO TABS: 20.0000 mg | ORAL_TABLET | Freq: Every day | ORAL | 1 refills | Status: DC

## 2024-07-04 MED ORDER — METOPROLOL TARTRATE 25 MG PO TABS: 25.0000 mg | ORAL_TABLET | Freq: Two times a day (BID) | ORAL | 1 refills | Status: DC

## 2024-07-04 MED ORDER — LOSARTAN POTASSIUM-HCTZ 100-25 MG PO TABS: 1.0000 | ORAL_TABLET | Freq: Every day | ORAL | 1 refills | Status: DC

## 2024-07-19 ENCOUNTER — Other Ambulatory Visit (INDEPENDENT_AMBULATORY_CARE_PROVIDER_SITE_OTHER): Payer: Self-pay | Admitting: Primary Care

## 2024-07-20 ENCOUNTER — Other Ambulatory Visit (INDEPENDENT_AMBULATORY_CARE_PROVIDER_SITE_OTHER): Payer: Self-pay | Admitting: Primary Care

## 2024-07-20 DIAGNOSIS — N3281 Overactive bladder: Secondary | ICD-10-CM

## 2024-07-21 NOTE — Telephone Encounter (Signed)
 Requested Prescriptions  Refused Prescriptions Disp Refills   esomeprazole  (NEXIUM ) 40 MG capsule [Pharmacy Med Name: Esomeprazole  Magnesium  40 MG Oral Capsule Delayed Release] 100 capsule 2    Sig: TAKE 1 CAPSULE BY MOUTH AT NOON     Gastroenterology: Proton Pump Inhibitors 2 Passed - 07/21/2024  3:43 PM      Passed - ALT in normal range and within 360 days    ALT  Date Value Ref Range Status  11/24/2023 16 0 - 32 IU/L Final         Passed - AST in normal range and within 360 days    AST  Date Value Ref Range Status  11/24/2023 15 0 - 40 IU/L Final         Passed - Valid encounter within last 12 months    Recent Outpatient Visits           5 months ago Frequent headaches   [place] [facility] Celestia Rosaline SQUIBB, NP   8 months ago Type 2 diabetes mellitus without complication, without long-term current use of insulin  (HCC)   [place] [facility] Celestia Rosaline SQUIBB, NP   11 months ago Type 2 diabetes mellitus without complication, without long-term current use of insulin  (HCC)   [place] [facility] Celestia Rosaline SQUIBB, NP   1 year ago Encounter for Medicare annual wellness exam   [place] [facility] Celestia Rosaline SQUIBB, NP   1 year ago Type 2 diabetes mellitus without complication, without long-term current use of insulin  [facility])   [place] [facility] Celestia Rosaline SQUIBB, NP       Future Appointments             In 1 month Celestia, Rosaline SQUIBB, NP [place] [facility]

## 2024-07-31 ENCOUNTER — Encounter: Payer: Self-pay | Admitting: Primary Care

## 2024-08-05 ENCOUNTER — Other Ambulatory Visit (INDEPENDENT_AMBULATORY_CARE_PROVIDER_SITE_OTHER): Payer: Self-pay | Admitting: Primary Care

## 2024-08-05 DIAGNOSIS — Z760 Encounter for issue of repeat prescription: Secondary | ICD-10-CM

## 2024-08-05 DIAGNOSIS — E119 Type 2 diabetes mellitus without complications: Secondary | ICD-10-CM

## 2024-08-15 ENCOUNTER — Other Ambulatory Visit (INDEPENDENT_AMBULATORY_CARE_PROVIDER_SITE_OTHER): Payer: Self-pay | Admitting: Primary Care

## 2024-08-15 DIAGNOSIS — Z760 Encounter for issue of repeat prescription: Secondary | ICD-10-CM

## 2024-08-15 DIAGNOSIS — E119 Type 2 diabetes mellitus without complications: Secondary | ICD-10-CM

## 2024-08-16 NOTE — Telephone Encounter (Signed)
 Requested Prescriptions  Pending Prescriptions Disp Refills   sitaGLIPtin-metformin  (JANUMET ) 50-1000 MG tablet [Pharmacy Med Name: Janumet  50-1000 MG Oral Tablet] 60 tablet 0    Sig: TAKE 1 TABLET BY MOUTH TWICE  DAILY WITH A MEAL     Endocrinology:  Diabetes - Biguanide + DPP-4 Inhibitor Combos Failed - 08/16/2024  1:44 PM      Failed - HBA1C is between 0 and 7.9 and within 180 days    HbA1c, POC (controlled diabetic range)  Date Value Ref Range Status  08/12/2023 6.1 0.0 - 7.0 % Final   Hgb A1c MFr Bld  Date Value Ref Range Status  11/24/2023 6.4 (H) 4.8 - 5.6 % Final    Comment:             Prediabetes: 5.7 - 6.4          Diabetes: >6.4          Glycemic control for adults with diabetes: <7.0          Failed - B12 Level in normal range and within 720 days    No results found for: VITAMINB12       Passed - Cr in normal range and within 360 days    Creat  Date Value Ref Range Status  01/16/2014 0.83 0.50 - 1.10 mg/dL Final   Creatinine, Ser  Date Value Ref Range Status  11/24/2023 0.92 0.57 - 1.00 mg/dL Final         Passed - eGFR in normal range and within 360 days    GFR calc Af Amer  Date Value Ref Range Status  10/28/2020 68 >59 mL/min/1.73 Final    Comment:    **In accordance with recommendations from the NKF-ASN Task force,**   [facility] is in the process of updating its eGFR calculation to the   2021 CKD-EPI creatinine equation that estimates kidney function   without a race variable.    GFR, Estimated  Date Value Ref Range Status  12/25/2022 52 (L) >60 mL/min Final    Comment:    (NOTE) Calculated using the CKD-EPI Creatinine Equation (2021)    eGFR  Date Value Ref Range Status  11/24/2023 68 >59 mL/min/1.73 Final         Passed - Valid encounter within last 6 months    Recent Outpatient Visits           5 months ago Frequent headaches   [place] [facility] Celestia Rosaline SQUIBB, NP   8 months ago Type 2 diabetes mellitus  without complication, without long-term current use of insulin  (HCC)   [place] [facility] Celestia Rosaline SQUIBB, NP   1 year ago Type 2 diabetes mellitus without complication, without long-term current use of insulin  (HCC)   [place] [facility] Celestia Rosaline SQUIBB, NP   1 year ago Encounter for Medicare annual wellness exam   [place] [facility] Celestia Rosaline SQUIBB, NP   1 year ago Type 2 diabetes mellitus without complication, without long-term current use of insulin  [facility])    [facility] Celestia Rosaline SQUIBB, NP       Future Appointments             In 1 week Celestia Rosaline SQUIBB, NP [facility], [place]            Passed - CBC within normal limits and completed in the last 12 months    WBC  Date  Value Ref Range Status  11/24/2023 10.5 3.4 - 10.8 x10E3/uL Final  12/25/2022 12.9 (H) 4.0 - 10.5 K/uL Final   RBC  Date Value Ref Range Status  11/24/2023 4.64 3.77 - 5.28 x10E6/uL Final  12/25/2022 4.91 3.87 - 5.11 MIL/uL Final   Hemoglobin  Date Value Ref Range Status  11/24/2023 13.4 11.1 - 15.9 g/dL Final   Hematocrit  Date Value Ref Range Status  11/24/2023 40.3 34.0 - 46.6 % Final   MCHC  Date Value Ref Range Status  11/24/2023 33.3 31.5 - 35.7 g/dL Final  98/87/7975 66.1 30.0 - 36.0 g/dL Final   [facility]  Date Value Ref Range Status  11/24/2023 28.9 26.6 - 33.0 pg Final  12/25/2022 29.3 26.0 - 34.0 pg Final   MCV  Date Value Ref Range Status  11/24/2023 87 79 - 97 fL Final   Platelet Count, POC  Date Value Ref Range Status  01/16/2014 331 142 - 424 K/uL Final   RDW  Date Value Ref Range Status  11/24/2023 14.2 11.7 - 15.4 % Final   RDW, POC  Date Value Ref Range Status  01/16/2014 15.4 % Final

## 2024-08-23 ENCOUNTER — Telehealth (INDEPENDENT_AMBULATORY_CARE_PROVIDER_SITE_OTHER): Payer: Self-pay | Admitting: Primary Care

## 2024-08-23 NOTE — Telephone Encounter (Signed)
 Called pt to confirm appt. Pt did not answer and LVM

## 2024-08-24 ENCOUNTER — Ambulatory Visit (INDEPENDENT_AMBULATORY_CARE_PROVIDER_SITE_OTHER): Admitting: Primary Care

## 2024-08-24 ENCOUNTER — Encounter (INDEPENDENT_AMBULATORY_CARE_PROVIDER_SITE_OTHER): Payer: Self-pay | Admitting: Primary Care

## 2024-08-24 DIAGNOSIS — Z6835 Body mass index (BMI) 35.0-35.9, adult: Secondary | ICD-10-CM

## 2024-08-24 DIAGNOSIS — F17210 Nicotine dependence, cigarettes, uncomplicated: Secondary | ICD-10-CM | POA: Diagnosis not present

## 2024-08-24 DIAGNOSIS — R748 Abnormal levels of other serum enzymes: Secondary | ICD-10-CM | POA: Diagnosis not present

## 2024-08-24 DIAGNOSIS — E66812 Obesity, class 2: Secondary | ICD-10-CM | POA: Diagnosis not present

## 2024-08-24 DIAGNOSIS — R143 Flatulence: Secondary | ICD-10-CM | POA: Diagnosis not present

## 2024-08-24 DIAGNOSIS — I10 Essential (primary) hypertension: Secondary | ICD-10-CM

## 2024-08-24 DIAGNOSIS — L602 Onychogryphosis: Secondary | ICD-10-CM | POA: Diagnosis not present

## 2024-08-24 DIAGNOSIS — Z720 Tobacco use: Secondary | ICD-10-CM

## 2024-08-24 DIAGNOSIS — Z7985 Long-term (current) use of injectable non-insulin antidiabetic drugs: Secondary | ICD-10-CM | POA: Diagnosis not present

## 2024-08-24 DIAGNOSIS — E782 Mixed hyperlipidemia: Secondary | ICD-10-CM | POA: Diagnosis not present

## 2024-08-24 DIAGNOSIS — E119 Type 2 diabetes mellitus without complications: Secondary | ICD-10-CM | POA: Diagnosis not present

## 2024-08-24 LAB — POCT GLYCOSYLATED HEMOGLOBIN (HGB A1C): HbA1c, POC (controlled diabetic range): 6.2 % (ref 0.0–7.0)

## 2024-08-24 NOTE — Progress Notes (Signed)
 Subjective:  Patient ID: Amber Meyer, female    DOB: 1956/09/09  Age: 68 y.o. MRN: [id number]  CC: Diabetes   T[place] OBORN presents forFollow-up of diabetes. Patient does not check blood sugar at home Diabetes    Compliant with meds - Yes Checking CBGs? Yes  Fasting avg - 109-132  Postprandial average -  Exercising regularly? - Yes Watching carbohydrate intake? - Yes Neuropathy ? - Yes left foot 2 toesbig toe and next one to it Hypoglycemic events - No  - Recovers with :   Pertinent ROS:  Polyuria - No Polydipsia - Yes Vision problems - No  Medications as noted below. Taking them regularly without complication/adverse reaction being reported today.   History Amber Meyer has a past medical history of Allergy, Arthritis, Cataract, GERD (gastroesophageal reflux disease), Hyperlipidemia, Hypertension, MRSA infection, Renal disorder, Sinus bradycardia seen on cardiac monitor, Urgency of urination, and Urinary frequency.   She has a past surgical history that includes Abdominal hysterectomy (1981); Knee surgery (Right, 2013); Lithotripsy (2003, 2006); Kidney stone removal (Left, 1999); Knee Arthroplasty (Right, 12/23/2017); and Colonoscopy.   Her family history includes Alzheimer's disease in her father; Aneurysm in her mother.She reports that she has been smoking cigarettes. She has been exposed to tobacco smoke. She has never used smokeless tobacco. She reports that she does not drink alcohol  and does not use drugs.  Current Outpatient Medications on File Prior to Visit  Medication Sig Dispense Refill   Accu-Chek Softclix Lancets lancets Use to check blood sugar 1 times daily. E11.9 100 each 6   albuterol  (VENTOLIN  HFA) 108 (90 Base) MCG/ACT inhaler INHALE 2 INHALATIONS BY MOUTH  INTO THE LUNGS EVERY 4 HOURS AS  NEEDED. FOR WHEEZING 108 g 2   amitriptyline  (ELAVIL ) 50 MG tablet Take 1 tablet (50 mg total) by mouth at bedtime. 90 tablet 1   aspirin 81 MG EC tablet Take  81 mg by mouth daily. Swallow whole.     atorvastatin  (LIPITOR) 20 MG tablet Take 1 tablet (20 mg total) by mouth daily. 90 tablet 1   Blood Glucose Monitoring Suppl (ACCU-CHEK GUIDE) w/Device KIT Use to check blood sugar 1 times daily. E11.9 1 kit 0   esomeprazole  (NEXIUM ) 40 MG capsule TAKE 1 CAPSULE BY MOUTH AT NOON 90 capsule 1   glucose blood (ACCU-CHEK GUIDE) test strip Use to check blood sugar 1 times daily. E11.9 100 each 6   ibuprofen  (ADVIL ) 600 MG tablet TAKE 1 TABLET BY MOUTH EVERY 8  HOURS AS NEEDED 90 tablet 1   losartan -hydrochlorothiazide  (HYZAAR) 100-25 MG tablet Take 1 tablet by mouth daily. 90 tablet 1   metoprolol  tartrate (LOPRESSOR ) 25 MG tablet Take 1 tablet (25 mg total) by mouth 2 (two) times daily. 180 tablet 1   mirabegron  ER (MYRBETRIQ ) 50 MG TB24 tablet Take 1 tablet (50 mg total) by mouth daily. 90 tablet 1   Multiple Vitamins-Calcium  (ONE-A-DAY WOMENS PO) Take 1 tablet by mouth daily.     oxybutynin  (DITROPAN -XL) 10 MG 24 hr tablet Take 1 tablet (10 mg total) by mouth at bedtime. 90 tablet 1   sitaGLIPtin-metformin  (JANUMET ) 50-1000 MG tablet TAKE 1 TABLET BY MOUTH TWICE  DAILY WITH A MEAL 60 tablet 0   No current facility-administered medications on file prior to visit.    Review of Systems Comprehensive ROS Pertinent positive and negative noted in HPI   Objective:  There were no vitals taken for this visit.  BP Readings from Last 3 Encounters:  02/22/24 138/83  11/24/23 (!) 144/88  08/12/23 (!) 142/78    Wt Readings from Last 3 Encounters:  02/22/24 228 lb 9.6 oz (103.7 kg)  11/24/23 230 lb 9.6 oz (104.6 kg)  08/12/23 234 lb 12.8 oz (106.5 kg)    Physical Exam Vitals reviewed.  Constitutional:      Appearance: Normal appearance. She is obese.  HENT:     Head: Normocephalic.     Right Ear: Tympanic membrane, ear canal and external ear normal.     Left Ear: Tympanic membrane, ear canal and external ear normal.     Nose: Nose normal.      Mouth/Throat:     Mouth: Mucous membranes are moist.  Eyes:     Extraocular Movements: Extraocular movements intact.     Pupils: Pupils are equal, round, and reactive to light.  Cardiovascular:     Rate and Rhythm: Normal rate.  Pulmonary:     Effort: Pulmonary effort is normal.     Breath sounds: Normal breath sounds.  Abdominal:     General: Bowel sounds are normal.     Palpations: Abdomen is soft.  Musculoskeletal:        General: Normal range of motion.     Cervical back: Normal range of motion and neck supple.  Skin:    General: Skin is warm and dry.  Neurological:     Mental Status: She is alert and oriented to person, place, and time.  Psychiatric:        Mood and Affect: Mood normal.        Behavior: Behavior normal.        Thought Content: Thought content normal.     Lab Results  Component Value Date   HGBA1C 6.2 08/24/2024   HGBA1C 6.4 (H) 11/24/2023   HGBA1C 6.1 08/12/2023    Lab Results  Component Value Date   WBC 10.5 11/24/2023   HGB 13.4 11/24/2023   HCT 40.3 11/24/2023   PLT 356 11/24/2023   GLUCOSE 117 (H) 11/24/2023   CHOL 119 11/24/2023   TRIG 182 (H) 11/24/2023   HDL 47 11/24/2023   LDLCALC 42 11/24/2023   ALT 16 11/24/2023   AST 15 11/24/2023   NA 142 11/24/2023   K 4.1 11/24/2023   CL 102 11/24/2023   CREATININE 0.92 11/24/2023   BUN 18 11/24/2023   CO2 23 11/24/2023   TSH 1.160 07/03/2021   HGBA1C 6.2 08/24/2024    Title   Diabetic Foot Exam - detailed Date & Time: 08/24/2024  9:37 AM Diabetic Foot exam was performed with the following findings: Yes  Visual Foot Exam completed.: Yes  Is there a history of foot ulcer?: No Is there a foot ulcer now?: No Is there swelling?: No Is there elevated skin temperature?: No Is there abnormal foot shape?: No Is there a claw toe deformity?: No Are the toenails long?: No Are the toenails thick?: No Are the toenails ingrown?: No Is the skin thin, fragile, shiny and hairless?: No Normal  Range of Motion?: Yes Is there foot or ankle muscle weakness?: No Do you have pain in calf while walking?: No Are the shoes appropriate in style and fit?: Yes Can the patient see the bottom of their feet?: Yes Pulse Foot Exam completed.: Yes   Right Dorsalis Pedis: Present Left Dorsalis Pedis: Present     Sensory Foot Exam Completed.: Yes Semmes-Weinstein Monofilament Test + means has sensation and - means no sensation  R Foot Test Control: Pos  L Foot Test Control: Pos   R Site 1-Great Toe: Pos L Site 1-Great Toe: Pos   R Site 4: Pos L Site 4: Pos   R site 5: Pos L Site 5: Pos  R Site 6: Pos L Site 6: Pos     Image components are not supported.   Image components are not supported. Image components are not supported.  Tuning [place] Right vibratory: diminished Left vibratory: diminished  Comments Onychauxis periodically has numbness bilateral feet intermittent      Assessment & Plan:  Amber Meyer was seen today for diabetes.  Diagnoses and all orders for this visit:  Class 2 obesity due to excess calories without serious comorbidity with body mass index (BMI) of 35.0 to 35.9 in adult -     Ambulatory referral to Ophthalmology -     Microalbumin / creatinine urine ratio  Type 2 diabetes mellitus without complication, without long-term current use of insulin  (HCC) -     POCT glycosylated hemoglobin (Hb A1C) -     Ambulatory referral to Ophthalmology -     Microalbumin / creatinine urine ratio  Elevated triglycerides with high cholesterol -     Lipid Panel  Elevated liver enzymes -     Microalbumin / creatinine urine ratio  Essential hypertension Well controlled  -     CMP14+EGFR  Tobacco abuse - I have recommended complete cessation of tobacco use. I have discussed various options available for assistance with tobacco cessation including over the counter methods (Nicotine  gum, patch and lozenges). We also discussed prescription options (Chantix, Nicotine   Inhaler / Nasal Spray). The patient is not interested in pursuing any prescription tobacco cessation options at this time. - Patient declines at this time.  - Less than 5 minutes spent on counseling.  Onychauxis -     Ambulatory referral to Podiatry   Flatus  Lactobacillus acidophilus is a beneficial bacteria that helps maintain a healthy balance of gut flora in your stomach and intestines  Follow-up:  Return in about 6 months (around 02/21/2025).  The above assessment and management plan was discussed with the patient. The patient verbalized understanding of and has agreed to the management plan. Patient is aware to call the clinic if symptoms fail to improve or worsen. Patient is aware when to return to the clinic for a follow-up visit. Patient educated on when it is appropriate to go to the emergency department.   Amber Bohr, NP-C

## 2024-08-24 NOTE — Patient Instructions (Signed)
 Lactobacillus acidophilus is a beneficial bacteria that helps maintain a healthy balance of gut flora in your stomach and intestines

## 2024-08-25 LAB — CMP14+EGFR
ALT: 12 IU/L (ref 0–32)
AST: 14 IU/L (ref 0–40)
Albumin: 4.6 g/dL (ref 3.9–4.9)
Alkaline Phosphatase: 166 IU/L — ABNORMAL HIGH (ref 44–121)
BUN/Creatinine Ratio: 16 (ref 12–28)
BUN: 15 mg/dL (ref 8–27)
Bilirubin Total: <0.2 mg/dL (ref 0.0–1.2)
CO2: 21 mmol/L (ref 20–29)
Calcium: 11 mg/dL — ABNORMAL HIGH (ref 8.7–10.3)
Chloride: 99 mmol/L (ref 96–106)
Creatinine, Ser: 0.96 mg/dL (ref 0.57–1.00)
Globulin, Total: 2.7 g/dL (ref 1.5–4.5)
Glucose: 98 mg/dL (ref 70–99)
Potassium: 4.6 mmol/L (ref 3.5–5.2)
Sodium: 140 mmol/L (ref 134–144)
Total Protein: 7.3 g/dL (ref 6.0–8.5)
eGFR: 64 mL/min/1.73 (ref >59)

## 2024-08-25 LAB — LIPID PANEL
Chol/HDL Ratio: 2.6 ratio (ref 0.0–4.4)
Cholesterol, Total: 120 mg/dL (ref 100–199)
HDL: 46 mg/dL (ref >39)
LDL Chol Calc (NIH): 49 mg/dL (ref 0–99)
Triglycerides: 143 mg/dL (ref 0–149)
VLDL Cholesterol Cal: 25 mg/dL (ref 5–40)

## 2024-08-25 LAB — MICROALBUMIN / CREATININE URINE RATIO
Creatinine, Urine: 148.7 mg/dL
Microalb/Creat Ratio: 8 mg/g{creat} (ref 0–29)
Microalbumin, Urine: 12.2 ug/mL

## 2024-08-30 ENCOUNTER — Ambulatory Visit (INDEPENDENT_AMBULATORY_CARE_PROVIDER_SITE_OTHER): Payer: Self-pay | Admitting: Primary Care

## 2024-09-08 ENCOUNTER — Ambulatory Visit: Admitting: Podiatry

## 2024-09-13 ENCOUNTER — Other Ambulatory Visit (INDEPENDENT_AMBULATORY_CARE_PROVIDER_SITE_OTHER): Payer: Self-pay | Admitting: Primary Care

## 2024-09-13 DIAGNOSIS — Z760 Encounter for issue of repeat prescription: Secondary | ICD-10-CM

## 2024-09-13 DIAGNOSIS — E119 Type 2 diabetes mellitus without complications: Secondary | ICD-10-CM

## 2024-09-15 NOTE — Telephone Encounter (Signed)
 Requested Prescriptions  Pending Prescriptions Disp Refills   sitaGLIPtin-metformin  (JANUMET ) 50-1000 MG tablet [Pharmacy Med Name: Janumet  50-1000 MG Oral Tablet] 180 tablet 1    Sig: TAKE 1 TABLET BY MOUTH TWICE  DAILY WITH A MEAL     Endocrinology:  Diabetes - Biguanide + DPP-4 Inhibitor Combos Failed - 09/15/2024 10:33 AM      Failed - B12 Level in normal range and within 720 days    No results found for: VITAMINB12       Passed - HBA1C is between 0 and 7.9 and within 180 days    HbA1c, POC (controlled diabetic range)  Date Value Ref Range Status  08/24/2024 6.2 0.0 - 7.0 % Final         Passed - Cr in normal range and within 360 days    Creat  Date Value Ref Range Status  01/16/2014 0.83 0.50 - 1.10 mg/dL Final   Creatinine, Ser  Date Value Ref Range Status  08/24/2024 0.96 0.57 - 1.00 mg/dL Final         Passed - eGFR in normal range and within 360 days    GFR calc Af Amer  Date Value Ref Range Status  10/28/2020 68 >59 mL/min/1.73 Final    Comment:    **In accordance with recommendations from the NKF-ASN Task force,**   [facility] is in the process of updating its eGFR calculation to the   2021 CKD-EPI creatinine equation that estimates kidney function   without a race variable.    GFR, Estimated  Date Value Ref Range Status  12/25/2022 52 (L) >60 mL/min Final    Comment:    (NOTE) Calculated using the CKD-EPI Creatinine Equation (2021)    eGFR  Date Value Ref Range Status  08/24/2024 64 >59 mL/min/1.73 Final         Passed - Valid encounter within last 6 months    Recent Outpatient Visits           3 weeks ago Class 2 obesity due to excess calories without serious comorbidity with body mass index (BMI) of 35.0 to 35.9 in adult   [place] [facility] Celestia Rosaline SQUIBB, NP   6 months ago Frequent headaches   [place] [facility] Celestia Rosaline SQUIBB, NP   9 months ago Type 2 diabetes mellitus without complication,  without long-term current use of insulin  (HCC)   [place] [facility] Celestia Rosaline SQUIBB, NP   1 year ago Type 2 diabetes mellitus without complication, without long-term current use of insulin  (HCC)   [place] [facility] Celestia Rosaline SQUIBB, NP   1 year ago Encounter for Medicare annual wellness exam   [place] [facility] Celestia Rosaline SQUIBB, NP              Passed - CBC within normal limits and completed in the last 12 months    WBC  Date Value Ref Range Status  11/24/2023 10.5 3.4 - 10.8 x10E3/uL Final  12/25/2022 12.9 (H) 4.0 - 10.5 K/uL Final   RBC  Date Value Ref Range Status  11/24/2023 4.64 3.77 - 5.28 x10E6/uL Final  12/25/2022 4.91 3.87 - 5.11 MIL/uL Final   Hemoglobin  Date Value Ref Range Status  11/24/2023 13.4 11.1 - 15.9 g/dL Final   Hematocrit  Date Value Ref Range Status  11/24/2023 40.3 34.0 - 46.6 % Final   MCHC  Date Value Ref Range Status  11/24/2023 33.3 31.5 -  35.7 g/dL Final  98/87/7975 66.1 30.0 - 36.0 g/dL Final   [facility]  Date Value Ref Range Status  11/24/2023 28.9 26.6 - 33.0 pg Final  12/25/2022 29.3 26.0 - 34.0 pg Final   MCV  Date Value Ref Range Status  11/24/2023 87 79 - 97 fL Final   Platelet Count, POC  Date Value Ref Range Status  01/16/2014 331 142 - 424 K/uL Final   RDW  Date Value Ref Range Status  11/24/2023 14.2 11.7 - 15.4 % Final   RDW, POC  Date Value Ref Range Status  01/16/2014 15.4 % Final

## 2024-09-27 ENCOUNTER — Ambulatory Visit: Admitting: Podiatry

## 2024-09-27 NOTE — Progress Notes (Signed)
 Amber Meyer                                          MRN: [id number]   09/27/2024   The [facility] Quality Team Specialist reviewed this patient medical record for the purposes of chart review for care gap closure. The following were reviewed: chart review for care gap closure-diabetic eye exam.    [facility] Quality Team

## 2024-10-03 ENCOUNTER — Other Ambulatory Visit (INDEPENDENT_AMBULATORY_CARE_PROVIDER_SITE_OTHER): Payer: Self-pay | Admitting: Primary Care

## 2024-10-03 DIAGNOSIS — N3281 Overactive bladder: Secondary | ICD-10-CM

## 2024-10-03 NOTE — Telephone Encounter (Signed)
 Will forward to provider

## 2024-10-11 ENCOUNTER — Other Ambulatory Visit (INDEPENDENT_AMBULATORY_CARE_PROVIDER_SITE_OTHER): Payer: Self-pay | Admitting: Primary Care

## 2024-10-11 DIAGNOSIS — G4700 Insomnia, unspecified: Secondary | ICD-10-CM

## 2024-10-11 DIAGNOSIS — Z760 Encounter for issue of repeat prescription: Secondary | ICD-10-CM

## 2024-10-13 NOTE — Telephone Encounter (Signed)
 Requested medication (s) are due for refill today: na   Requested medication (s) are on the active medication list: yes   Last refill:  07/04/24 #90 1 refills  Future visit scheduled: yes 02/21/25  Notes to clinic:   requesting 1 year supply. Do you want to refill for 1 year?     Requested Prescriptions  Pending Prescriptions Disp Refills   amitriptyline  (ELAVIL ) 50 MG tablet [Pharmacy Med Name: Amitriptyline  HCl 50 MG Oral Tablet] 30 tablet 11    Sig: TAKE 1 TABLET BY MOUTH AT  BEDTIME     Psychiatry:  Antidepressants - Heterocyclics (TCAs) Passed - 10/13/2024  1:48 PM      Passed - Valid encounter within last 6 months    Recent Outpatient Visits           1 month ago Class 2 obesity due to excess calories without serious comorbidity with body mass index (BMI) of 35.0 to 35.9 in adult   [place] [facility] Celestia Rosaline SQUIBB, NP   7 months ago Frequent headaches   [place] [facility] Celestia Rosaline SQUIBB, NP   10 months ago Type 2 diabetes mellitus without complication, without long-term current use of insulin  (HCC)   [place] [facility] Celestia Rosaline SQUIBB, NP   1 year ago Type 2 diabetes mellitus without complication, without long-term current use of insulin  [facility])   [place] [facility] Celestia Rosaline SQUIBB, NP   1 year ago Encounter for Medicare annual wellness exam   [place] [facility] Celestia Rosaline SQUIBB, NP

## 2024-10-14 ENCOUNTER — Other Ambulatory Visit (INDEPENDENT_AMBULATORY_CARE_PROVIDER_SITE_OTHER): Payer: Self-pay | Admitting: Primary Care

## 2024-10-14 DIAGNOSIS — Z760 Encounter for issue of repeat prescription: Secondary | ICD-10-CM

## 2024-10-14 DIAGNOSIS — I10 Essential (primary) hypertension: Secondary | ICD-10-CM

## 2024-11-01 ENCOUNTER — Other Ambulatory Visit (INDEPENDENT_AMBULATORY_CARE_PROVIDER_SITE_OTHER): Payer: Self-pay | Admitting: Primary Care

## 2024-11-01 DIAGNOSIS — I10 Essential (primary) hypertension: Secondary | ICD-10-CM

## 2024-11-01 DIAGNOSIS — Z760 Encounter for issue of repeat prescription: Secondary | ICD-10-CM

## 2024-11-01 DIAGNOSIS — G4700 Insomnia, unspecified: Secondary | ICD-10-CM

## 2024-11-03 NOTE — Telephone Encounter (Signed)
 Will forward to provider

## 2024-11-22 ENCOUNTER — Other Ambulatory Visit (INDEPENDENT_AMBULATORY_CARE_PROVIDER_SITE_OTHER): Payer: Self-pay | Admitting: Primary Care

## 2024-11-22 DIAGNOSIS — E119 Type 2 diabetes mellitus without complications: Secondary | ICD-10-CM

## 2024-11-22 DIAGNOSIS — Z760 Encounter for issue of repeat prescription: Secondary | ICD-10-CM

## 2024-11-24 NOTE — Telephone Encounter (Signed)
 Refilled 09/15/24 # 180 with 1 refill. Requested Prescriptions  Refused Prescriptions Disp Refills   JANUMET  50-1000 MG tablet [Pharmacy Med Name: Janumet  50-1000 MG Oral Tablet] 200 tablet 2    Sig: TAKE 1 TABLET BY MOUTH TWICE  DAILY WITH A MEAL     Endocrinology:  Diabetes - Biguanide + DPP-4 Inhibitor Combos Failed - 11/24/2024  1:06 PM      Failed - B12 Level in normal range and within 720 days    No results found for: VITAMINB12       Failed - CBC within normal limits and completed in the last 12 months    WBC  Date Value Ref Range Status  11/24/2023 10.5 3.4 - 10.8 x10E3/uL Final  12/25/2022 12.9 (H) 4.0 - 10.5 K/uL Final   RBC  Date Value Ref Range Status  11/24/2023 4.64 3.77 - 5.28 x10E6/uL Final  12/25/2022 4.91 3.87 - 5.11 MIL/uL Final   Hemoglobin  Date Value Ref Range Status  11/24/2023 13.4 11.1 - 15.9 g/dL Final   Hematocrit  Date Value Ref Range Status  11/24/2023 40.3 34.0 - 46.6 % Final   MCHC  Date Value Ref Range Status  11/24/2023 33.3 31.5 - 35.7 g/dL Final  98/87/7975 66.1 30.0 - 36.0 g/dL Final   [facility]  Date Value Ref Range Status  11/24/2023 28.9 26.6 - 33.0 pg Final  12/25/2022 29.3 26.0 - 34.0 pg Final   MCV  Date Value Ref Range Status  11/24/2023 87 79 - 97 fL Final   Platelet Count, POC  Date Value Ref Range Status  01/16/2014 331 142 - 424 K/uL Final   RDW  Date Value Ref Range Status  11/24/2023 14.2 11.7 - 15.4 % Final   RDW, POC  Date Value Ref Range Status  01/16/2014 15.4 % Final         Passed - HBA1C is between 0 and 7.9 and within 180 days    HbA1c, POC (controlled diabetic range)  Date Value Ref Range Status  08/24/2024 6.2 0.0 - 7.0 % Final         Passed - Cr in normal range and within 360 days    Creat  Date Value Ref Range Status  01/16/2014 0.83 0.50 - 1.10 mg/dL Final   Creatinine, Ser  Date Value Ref Range Status  08/24/2024 0.96 0.57 - 1.00 mg/dL Final         Passed - eGFR in normal range and  within 360 days    GFR calc Af Amer  Date Value Ref Range Status  10/28/2020 68 >59 mL/min/1.73 Final    Comment:    **In accordance with recommendations from the NKF-ASN Task force,**   [facility] is in the process of updating its eGFR calculation to the   2021 CKD-EPI creatinine equation that estimates kidney function   without a race variable.    GFR, Estimated  Date Value Ref Range Status  12/25/2022 52 (L) >60 mL/min Final    Comment:    (NOTE) Calculated using the CKD-EPI Creatinine Equation (2021)    eGFR  Date Value Ref Range Status  08/24/2024 64 >59 mL/min/1.73 Final         Passed - Valid encounter within last 6 months    Recent Outpatient Visits           3 months ago Class 2 obesity due to excess calories without serious comorbidity with body mass index (BMI) of 35.0 to 35.9 in adult   [facility] Celestia Rosaline SQUIBB, NP   9 months ago Frequent headaches   [employer] [facility] Celestia Rosaline SQUIBB, NP   1 year ago Type 2 diabetes mellitus without complication, without long-term current use of insulin  [facility])   [place] [facility] Celestia Rosaline SQUIBB, NP   1 year ago Type 2 diabetes mellitus without complication, without long-term current use of insulin  [facility])   [place] [facility] Celestia Rosaline SQUIBB, NP   1 year ago Encounter for Medicare annual wellness exam   [place] [facility] Celestia Rosaline SQUIBB, NP

## 2024-12-28 ENCOUNTER — Telehealth (INDEPENDENT_AMBULATORY_CARE_PROVIDER_SITE_OTHER): Payer: Self-pay | Admitting: Primary Care

## 2024-12-28 NOTE — Telephone Encounter (Signed)
 Left VM with pt about their upcoming appt. Pt did not answer

## 2025-01-09 ENCOUNTER — Ambulatory Visit (INDEPENDENT_AMBULATORY_CARE_PROVIDER_SITE_OTHER): Payer: Self-pay

## 2025-02-06 ENCOUNTER — Ambulatory Visit (INDEPENDENT_AMBULATORY_CARE_PROVIDER_SITE_OTHER)

## 2025-02-21 ENCOUNTER — Ambulatory Visit (INDEPENDENT_AMBULATORY_CARE_PROVIDER_SITE_OTHER): Admitting: Primary Care
